# Patient Record
Sex: Male | Born: 1950 | Race: Black or African American | Hispanic: No | Marital: Married | Smoking: Former smoker
Health system: Southern US, Community
[De-identification: ages and names within clinical notes are randomized; demographics above are authoritative.]

## PROBLEM LIST (undated history)

## (undated) DIAGNOSIS — M1990 Unspecified osteoarthritis, unspecified site: Secondary | ICD-10-CM

## (undated) DIAGNOSIS — D869 Sarcoidosis, unspecified: Secondary | ICD-10-CM

## (undated) DIAGNOSIS — E119 Type 2 diabetes mellitus without complications: Secondary | ICD-10-CM

## (undated) DIAGNOSIS — I10 Essential (primary) hypertension: Secondary | ICD-10-CM

## (undated) HISTORY — PX: SHOULDER SURGERY: SHX246

## (undated) HISTORY — PX: ROTATOR CUFF REPAIR: SHX139

---

## 1999-10-15 ENCOUNTER — Encounter: Admission: RE | Admit: 1999-10-15 | Discharge: 1999-10-15 | Payer: Self-pay | Admitting: Cardiology

## 1999-10-15 ENCOUNTER — Encounter: Payer: Self-pay | Admitting: Cardiology

## 2000-09-05 ENCOUNTER — Encounter: Payer: Self-pay | Admitting: Cardiology

## 2000-09-05 ENCOUNTER — Encounter: Admission: RE | Admit: 2000-09-05 | Discharge: 2000-09-05 | Payer: Self-pay | Admitting: Cardiology

## 2000-11-26 ENCOUNTER — Emergency Department (HOSPITAL_COMMUNITY): Admission: EM | Admit: 2000-11-26 | Discharge: 2000-11-26 | Payer: Self-pay | Admitting: Emergency Medicine

## 2000-11-27 ENCOUNTER — Encounter: Payer: Self-pay | Admitting: Emergency Medicine

## 2001-08-03 ENCOUNTER — Encounter: Payer: Self-pay | Admitting: Emergency Medicine

## 2001-08-03 ENCOUNTER — Emergency Department (HOSPITAL_COMMUNITY): Admission: EM | Admit: 2001-08-03 | Discharge: 2001-08-04 | Payer: Self-pay | Admitting: Emergency Medicine

## 2001-09-08 ENCOUNTER — Encounter: Payer: Self-pay | Admitting: Cardiology

## 2001-09-08 ENCOUNTER — Encounter: Admission: RE | Admit: 2001-09-08 | Discharge: 2001-09-08 | Payer: Self-pay | Admitting: Cardiology

## 2002-09-06 ENCOUNTER — Encounter: Admission: RE | Admit: 2002-09-06 | Discharge: 2002-09-06 | Payer: Self-pay | Admitting: Cardiology

## 2002-09-06 ENCOUNTER — Encounter: Payer: Self-pay | Admitting: Cardiology

## 2003-04-30 ENCOUNTER — Emergency Department (HOSPITAL_COMMUNITY): Admission: EM | Admit: 2003-04-30 | Discharge: 2003-04-30 | Payer: Self-pay | Admitting: Emergency Medicine

## 2006-04-08 ENCOUNTER — Encounter: Admission: RE | Admit: 2006-04-08 | Discharge: 2006-04-08 | Payer: Self-pay | Admitting: Cardiology

## 2007-08-23 ENCOUNTER — Emergency Department (HOSPITAL_COMMUNITY): Admission: EM | Admit: 2007-08-23 | Discharge: 2007-08-23 | Payer: Self-pay | Admitting: Emergency Medicine

## 2010-02-04 ENCOUNTER — Emergency Department (HOSPITAL_COMMUNITY): Admission: EM | Admit: 2010-02-04 | Discharge: 2010-02-04 | Payer: Self-pay | Admitting: Emergency Medicine

## 2010-02-04 ENCOUNTER — Emergency Department (HOSPITAL_COMMUNITY): Admission: EM | Admit: 2010-02-04 | Discharge: 2010-02-04 | Payer: Self-pay | Admitting: Family Medicine

## 2010-02-05 ENCOUNTER — Ambulatory Visit (HOSPITAL_COMMUNITY): Admission: RE | Admit: 2010-02-05 | Discharge: 2010-02-05 | Payer: Self-pay | Admitting: Emergency Medicine

## 2010-12-27 ENCOUNTER — Encounter: Payer: Self-pay | Admitting: Cardiology

## 2011-02-26 LAB — POCT URINALYSIS DIP (DEVICE)
Bilirubin Urine: NEGATIVE
Hgb urine dipstick: NEGATIVE
Ketones, ur: 40 mg/dL — AB
Specific Gravity, Urine: 1.015 (ref 1.005–1.030)
pH: 8.5 — ABNORMAL HIGH (ref 5.0–8.0)

## 2011-04-23 NOTE — Consult Note (Signed)
[place]  Patient:    Jeffrey Hayes, Jeffrey Hayes                        MRN: [id number] Proc. Date: 11/26/00 Adm. Date:  [id number] Attending:  Lorre Nick                          Consultation Report  REQUESTING PHYSICIAN:  Dr. 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  He presents today with a chief complaint of pain and deformity to his right hand.  He is an otherwise healthy 60 year old male.  ALLERGIES:  No known drug allergies.  CURRENT MEDICATIONS:  None.  PAST MEDICAL HISTORY:  No past medical or surgical history of note.  SOCIAL HISTORY:  Alcohol use and occasional smoking.  PHYSICAL EXAMINATION:  GENERAL:  Well-developed, well-nourished male, pleasant, alert, and oriented x 3.  EXTREMITIES:  Examination of his hand on the right:  He has an obvious open injury to the ring finger with a fractured nail plate and a nail bed injury. He also has a laceration to the long finger.  He has 2+ radial pulse and brisk capillary refill.  Skin is otherwise intact.  There are no other signs of erythema, drainage, or signs of infection.  Movement of the tips of the fingers is painful.  X-RAY DATA:  X-rays show a distal tuft fracture of the ring finger; no fracture of the long finger.  IMPRESSION:  A 60 year old male with an open fracture of distal phalanx of dominant right ring finger and soft tissue injury to the long finger.  Under sterile conditions, we anesthetized the ring finger and then prepped and draped in the usual sterile fashion.  We carefully dissected free the nail plate from underlying nail bed.  There was a complete laceration of the nail bed at the intersection between the germinal cell matrices.  This was repaired with 6-0 undyed Vicryl x 4  sutures.  This was followed by replacement of the nail plate back over the repaired nail bed after debridement of the open fracture.  The fracture was debrided of clot and nonviable material, including some bits of metallic fragments.  The wounds were then dressed with Xeroform, 4 x 4s, and a Coban compressive wrap.  The long finger was dressed with Xeroform, 4 x 4s, and a compressive wrap as well.  The patient was discharged from the emergency department with Keflex 500 mg one p.o. q.i.d. for a week for antibiotic prophylaxis as well as Vicodin, #20, one to two every three to four hours as needed for pain.  He is to follow up in my office this week on Thursday.  He was instructed to follow up with me sooner or call for any questions regarding increasing pain; signs of infection, including streaking up the arm, fever, chills, etc. DD:  11/26/00 TD:  11/27/00 Job: [id number]

## 2012-05-23 ENCOUNTER — Encounter (HOSPITAL_COMMUNITY): Payer: Self-pay | Admitting: *Deleted

## 2012-05-23 ENCOUNTER — Emergency Department (HOSPITAL_COMMUNITY): Payer: No Typology Code available for payment source

## 2012-05-23 ENCOUNTER — Emergency Department (HOSPITAL_COMMUNITY)
Admission: EM | Admit: 2012-05-23 | Discharge: 2012-05-24 | Disposition: A | Discharge status: Home / Self Care | Payer: No Typology Code available for payment source | Attending: Emergency Medicine | Admitting: Emergency Medicine

## 2012-05-23 DIAGNOSIS — S0990XA Unspecified injury of head, initial encounter: Secondary | ICD-10-CM | POA: Insufficient documentation

## 2012-05-23 DIAGNOSIS — R51 Headache: Secondary | ICD-10-CM | POA: Insufficient documentation

## 2012-05-23 DIAGNOSIS — S161XXA Strain of muscle, fascia and tendon at neck level, initial encounter: Secondary | ICD-10-CM

## 2012-05-23 DIAGNOSIS — S335XXA Sprain of ligaments of lumbar spine, initial encounter: Secondary | ICD-10-CM | POA: Insufficient documentation

## 2012-05-23 DIAGNOSIS — S39012A Strain of muscle, fascia and tendon of lower back, initial encounter: Secondary | ICD-10-CM

## 2012-05-23 DIAGNOSIS — S139XXA Sprain of joints and ligaments of unspecified parts of neck, initial encounter: Secondary | ICD-10-CM | POA: Insufficient documentation

## 2012-05-23 DIAGNOSIS — M542 Cervicalgia: Secondary | ICD-10-CM | POA: Insufficient documentation

## 2012-05-23 DIAGNOSIS — I10 Essential (primary) hypertension: Secondary | ICD-10-CM | POA: Insufficient documentation

## 2012-05-23 HISTORY — DX: Essential (primary) hypertension: I10

## 2012-05-23 MED ORDER — HYDROCODONE-ACETAMINOPHEN 5-325 MG PO TABS
1.0000 | ORAL_TABLET | Freq: Once | ORAL | Status: AC
  Administered 2012-05-24: 1 via ORAL
  Filled 2012-05-23: qty 1

## 2012-05-23 NOTE — ED Notes (Signed)
Pt in radiology 
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
## 2012-05-24 MED ORDER — HYDROCODONE-ACETAMINOPHEN 5-325 MG PO TABS: 1.0000 | ORAL_TABLET | Freq: Once | ORAL | Status: AC

## 2012-05-24 MED ORDER — CYCLOBENZAPRINE HCL 10 MG PO TABS: 10.0000 mg | ORAL_TABLET | Freq: Two times a day (BID) | ORAL | Status: AC | PRN
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
## 2012-05-24 NOTE — Discharge Instructions (Signed)
Cervical Sprain A cervical sprain is an injury in the neck in which the ligaments are stretched or torn. The ligaments are the tissues that hold the bones of the neck (vertebrae) in place.Cervical sprains can range from very mild to very severe. Most cervical sprains get better in 1 to 3 weeks, but it depends on the cause and extent of the injury. Severe cervical sprains can cause the neck vertebrae to be unstable. This can lead to damage of the spinal cord and can result in serious nervous system problems. Your caregiver will determine whether your cervical sprain is mild or severe. CAUSES  Severe cervical sprains may be caused by:  Contact sport injuries (football, rugby, wrestling, hockey, auto racing, gymnastics, diving, martial arts, boxing).   Motor vehicle collisions.   Whiplash injuries. This means the neck is forcefully whipped backward and forward.   Falls.  Mild cervical sprains may be caused by:   Awkward positions, such as cradling a telephone between your ear and shoulder.   Sitting in a chair that does not offer proper support.   Working at a poorly designed computer station.   Activities that require looking up or down for long periods of time.  SYMPTOMS   Pain, soreness, stiffness, or a burning sensation in the front, back, or sides of the neck. This discomfort may develop immediately after injury or it may develop slowly and not begin for 24 hours or more after an injury.   Pain or tenderness directly in the middle of the back of the neck.   Shoulder or upper back pain.   Limited ability to move the neck.   Headache.   Dizziness.   Weakness, numbness, or tingling in the hands or arms.   Muscle spasms.   Difficulty swallowing or chewing.   Tenderness and swelling of the neck.  DIAGNOSIS  Most of the time, your caregiver can diagnose this problem by taking your history and doing a physical exam. Your caregiver will ask about any known problems, such as  arthritis in the neck or a previous neck injury. X-rays may be taken to find out if there are any other problems, such as problems with the bones of the neck. However, an X-ray often does not reveal the full extent of a cervical sprain. Other tests such as a computed tomography (CT) scan or magnetic resonance imaging (MRI) may be needed. TREATMENT  Treatment depends on the severity of the cervical sprain. Mild sprains can be treated with rest, keeping the neck in place (immobilization), and pain medicines. Severe cervical sprains need immediate immobilization and an appointment with an orthopedist or neurosurgeon. Several treatment options are available to help with pain, muscle spasms, and other symptoms. Your caregiver may prescribe:  Medicines, such as pain relievers, numbing medicines, or muscle relaxants.   Physical therapy. This can include stretching exercises, strengthening exercises, and posture training. Exercises and improved posture can help stabilize the neck, strengthen muscles, and help stop symptoms from returning.   A neck collar to be worn for short periods of time. Often, these collars are worn for comfort. However, certain collars may be worn to protect the neck and prevent further worsening of a serious cervical sprain.  HOME CARE INSTRUCTIONS   Put ice on the injured area.   Put ice in a plastic bag.   Place a towel between your skin and the bag.   Leave the ice on for 15 to 20 minutes, 3 to 4 times a day.     to 20 minutes, 3 to 4 times a day.   Only take over-the-counter or prescription medicines for pain, discomfort, or fever as directed by your caregiver.   Keep all follow-up appointments as directed by your caregiver.   Keep all physical therapy appointments as directed by your caregiver.   If a neck collar is prescribed, wear it as directed by your caregiver.   Do not drive while wearing a neck collar.   Make any needed adjustments to your work station to promote good posture.   Avoid positions and activities that make your  symptoms worse.   Warm up and stretch before being active to help prevent problems.  SEEK MEDICAL CARE IF:    Your pain is not controlled with medicine.   You are unable to decrease your pain medicine over time as planned.   Your activity level is not improving as expected.  SEEK IMMEDIATE MEDICAL CARE IF:    You develop any bleeding, stomach upset, or signs of an allergic reaction to your medicine.   Your symptoms get worse.   You develop new, unexplained symptoms.   You have numbness, tingling, weakness, or paralysis in any part of your body.  MAKE SURE YOU:    Understand these instructions.   Will watch your condition.   Will get help right away if you are not doing well or get worse.  Document Released: 09/19/2007 Document Revised: 11/11/2011 Document Reviewed: 08/25/2011  [employer] Patient Information 2012 [employer]  Head Injury, Adult  You have had a head injury that does not appear serious at this time. A concussion is a state of changed mental ability, usually from a blow to the head. You should take clear liquids for the rest of the day and then resume your regular diet. You should not take sedatives or alcoholic beverages for as long as directed by your caregiver after discharge. After injuries such as yours, most problems occur within the first 24 hours.  SYMPTOMS  These minor symptoms may be experienced after discharge:   Memory difficulties.   Dizziness.   Headaches.   Double vision.   Hearing difficulties.   Depression.   Tiredness.   Weakness.   Difficulty with concentration.  If you experience any of these problems, you should not be alarmed. A concussion requires a few days for recovery. Many patients with head injuries frequently experience such symptoms. Usually, these problems disappear without medical care. If symptoms last for more than one day, notify your caregiver. See your caregiver sooner if symptoms are becoming worse rather than better.  HOME CARE INSTRUCTIONS     During the next 24 hours you must stay with someone who can watch you for the warning signs listed below.  Although it is unlikely that serious side effects will occur, you should be aware of signs and symptoms which may necessitate your return to this location. Side effects may occur up to 7 - 10 days following the injury. It is important for you to carefully monitor your condition and contact your caregiver or seek immediate medical attention if there is a change in your condition.  SEEK IMMEDIATE MEDICAL CARE IF:    There is confusion or drowsiness.   You can not awaken the injured person.   There is nausea (feeling sick to your stomach) or continued, forceful vomiting.   You notice dizziness or unsteadiness which is getting worse, or inability to walk.   You have convulsions or unconsciousness.   You experience severe, persistent   directed.   You can not use arms or legs normally.   There is clear or bloody discharge from the nose or ears.  MAKE SURE YOU:   Understand these instructions.   Will watch your condition.   Will get help right away if you are not doing well or get worse.  Document Released: 11/22/2005 Document Revised: 11/11/2011 Document Reviewed: 10/10/2009 [facility] Patient Information 2012 [place], [place].Lumbosacral Strain Lumbosacral strain is one of the most common causes of back pain. There are many causes of back pain. Most are not serious conditions. CAUSES  Your backbone (spinal column) is made up of 24 main vertebral bodies, the sacrum, and the coccyx. These are held together by muscles and tough, fibrous tissue (ligaments). Nerve roots pass through the openings between the vertebrae. A sudden move or injury to the back may cause injury to,  or pressure on, these nerves. This may result in localized back pain or pain movement (radiation) into the buttocks, down the leg, and into the foot. Sharp, shooting pain from the buttock down the back of the leg (sciatica) is frequently associated with a ruptured (herniated) disk. Pain may be caused by muscle spasm alone. Your caregiver can often find the cause of your pain by the details of your symptoms and an exam. In some cases, you may need tests (such as X-rays). Your caregiver will work with you to decide if any tests are needed based on your specific exam. HOME CARE INSTRUCTIONS   Avoid an underactive lifestyle. Active exercise, as directed by your caregiver, is your greatest weapon against back pain.   Avoid hard physical activities (tennis, racquetball, waterskiing) if you are not in proper physical condition for it. This may aggravate or create problems.   If you have a back problem, avoid sports requiring sudden body movements. Swimming and walking are generally safer activities.   Maintain good posture.   Avoid becoming overweight (obese).   Use bed rest for only the most extreme, sudden (acute) episode. Your caregiver will help you determine how much bed rest is necessary.   For acute conditions, you may put ice on the injured area.   Put ice in a plastic bag.   Place a towel between your skin and the bag.   Leave the ice on for 15 to 20 minutes at a time, every 2 hours, or as needed.   After you are improved and more active, it may help to apply heat for 30 minutes before activities.  See your caregiver if you are having pain that lasts longer than expected. Your caregiver can advise appropriate exercises or therapy if needed. With conditioning, most back problems can be avoided. SEEK IMMEDIATE MEDICAL CARE IF:   You have numbness, tingling, weakness, or problems with the use of your arms or legs.   You experience severe back pain not relieved with medicines.   There  is a change in bowel or bladder control.   You have increasing pain in any area of the body, including your belly (abdomen).   You notice shortness of breath, dizziness, or feel faint.   You feel sick to your stomach (nauseous), are throwing up (vomiting), or become sweaty.   You notice discoloration of your toes or legs, or your feet get very cold.   Your back pain is getting worse.   You have a fever.  MAKE SURE YOU:   Understand these instructions.   Will watch your condition.   Will get help right away if  you are not doing well or get worse.  Document Released: 09/01/2005 Document Revised: 11/11/2011 Document Reviewed: 02/21/2009 [facility] Patient Information 2012 [place], [place].

## 2012-05-26 NOTE — ED Provider Notes (Signed)
Medical screening examination/treatment/procedure(s) were performed by non-physician practitioner and as supervising physician I was immediately available for consultation/collaboration.   Sherene Plancarte, MD 05/26/12 1611 

## 2013-03-07 ENCOUNTER — Encounter (HOSPITAL_BASED_OUTPATIENT_CLINIC_OR_DEPARTMENT_OTHER)
Admission: RE | Disposition: A | Discharge status: Home / Self Care | Payer: Self-pay | Source: Ambulatory Visit | Attending: Ophthalmology

## 2013-03-07 ENCOUNTER — Encounter (HOSPITAL_BASED_OUTPATIENT_CLINIC_OR_DEPARTMENT_OTHER): Admission: RE | Payer: Self-pay | Source: Ambulatory Visit

## 2013-03-07 ENCOUNTER — Other Ambulatory Visit: Payer: Self-pay | Admitting: Ophthalmology

## 2013-03-07 ENCOUNTER — Ambulatory Visit (HOSPITAL_BASED_OUTPATIENT_CLINIC_OR_DEPARTMENT_OTHER)
Admission: RE | Admit: 2013-03-07 | Discharge: 2013-03-07 | Disposition: A | Discharge status: Home / Self Care | Payer: Medicaid Other | Source: Ambulatory Visit | Attending: Ophthalmology | Admitting: Ophthalmology

## 2013-03-07 ENCOUNTER — Encounter: Payer: Self-pay | Admitting: Ophthalmology

## 2013-03-07 DIAGNOSIS — F17200 Nicotine dependence, unspecified, uncomplicated: Secondary | ICD-10-CM | POA: Insufficient documentation

## 2013-03-07 DIAGNOSIS — H00019 Hordeolum externum unspecified eye, unspecified eyelid: Secondary | ICD-10-CM | POA: Insufficient documentation

## 2013-03-07 DIAGNOSIS — H02829 Cysts of unspecified eye, unspecified eyelid: Secondary | ICD-10-CM | POA: Insufficient documentation

## 2013-03-07 DIAGNOSIS — I10 Essential (primary) hypertension: Secondary | ICD-10-CM | POA: Insufficient documentation

## 2013-03-07 HISTORY — PX: CHALAZION EXCISION: SHX213

## 2013-03-07 SURGERY — EXCISION, CHALAZION
Anesthesia: LOCAL | Laterality: Right

## 2013-03-07 SURGERY — MINOR EXCISION OF CHALAZION
Anesthesia: LOCAL | Site: Eye | Laterality: Left | Wound class: Dirty or Infected

## 2013-03-07 MED ORDER — LIDOCAINE HCL 1 % IJ SOLN
INTRAMUSCULAR | Status: DC | PRN
  Administered 2013-03-07: 6 mL

## 2013-03-07 MED ORDER — BACITRACIN-POLYMYXIN B 500-10000 UNIT/GM OP OINT
TOPICAL_OINTMENT | OPHTHALMIC | Status: DC | PRN
  Administered 2013-03-07: 1 via OPHTHALMIC

## 2013-03-07 MED ORDER — BALANCED SALT IO SOLN
INTRAOCULAR | Status: DC | PRN
  Administered 2013-03-07: 15 mL via INTRAOCULAR

## 2013-03-07 SURGICAL SUPPLY — 19 items
APPLICATOR COTTON TIP 6IN STRL (MISCELLANEOUS) ×3 IMPLANT
BLADE SURG 11 STRL SS (BLADE) ×2 IMPLANT
CAUTERY EYE LOW TEMP 1300F FIN (OPHTHALMIC RELATED) IMPLANT
CLOTH BEACON ORANGE TIMEOUT ST (SAFETY) ×2 IMPLANT
GAUZE SPONGE 4X4 12PLY STRL LF (GAUZE/BANDAGES/DRESSINGS) ×2 IMPLANT
GLOVE BIO SURGEON STRL SZ 6.5 (GLOVE) ×1 IMPLANT
GLOVE ECLIPSE 7.0 STRL STRAW (GLOVE) ×2 IMPLANT
MARKER SKIN DUAL TIP RULER LAB (MISCELLANEOUS) ×2 IMPLANT
NDL HYPO 25X5/8 SAFETYGLIDE (NEEDLE) ×1 IMPLANT
NDL SAFETY ECLIPSE 18X1.5 (NEEDLE) ×1 IMPLANT
NEEDLE HYPO 18GX1.5 SHARP (NEEDLE) ×2
NEEDLE HYPO 25X5/8 SAFETYGLIDE (NEEDLE) ×2 IMPLANT
PAD ALCOHOL SWAB (MISCELLANEOUS) ×2 IMPLANT
PAD EYE OVAL STERILE LF (GAUZE/BANDAGES/DRESSINGS) ×4 IMPLANT
SPONGE GAUZE 4X4 12PLY (GAUZE/BANDAGES/DRESSINGS) ×1 IMPLANT
SUT SILK 6 0 P 1 (SUTURE) ×1 IMPLANT
SWABSTICK POVIDONE IODINE SNGL (MISCELLANEOUS) ×4 IMPLANT
SYR CONTROL 10ML LL (SYRINGE) ×2 IMPLANT
TOWEL OR 17X24 6PK STRL BLUE (TOWEL DISPOSABLE) ×2 IMPLANT

## 2013-03-07 NOTE — OR Nursing (Signed)
Dr Mitzi Davenport faxed H&P to Medical Records this AM.  H&P with chart.

## 2013-03-07 NOTE — Brief Op Note (Signed)
03/07/2013  8:00 AM  PATIENT:  Delana Meyer  62 y.o. male  PRE-OPERATIVE DIAGNOSIS:  cyst  POST-OPERATIVE DIAGNOSIS:  * No post-op diagnosis entered *  PROCEDURE:  Procedure(s): EXCISION OF CYST LOWER LEFT LID (Left)  SURGEON:  Surgeon(s) and Role:    Vita Erm., MD - Primary  PHYSICIAN ASSISTANT:   ASSISTANTS: none   ANESTHESIA:   local  EBL:     BLOOD ADMINISTERED:none  DRAINS: none   LOCAL MEDICATIONS USED:  LIDOCAINE   SPECIMEN:  Excision  DISPOSITION OF SPECIMEN:  PATHOLOGY  COUNTS:  YES  TOURNIQUET:  * No tourniquets in log *  DICTATION: .Other Dictation: Dictation Number [id number]  PLAN OF CARE: Discharge to home after PACU  PATIENT DISPOSITION:  Short Stay   Delay start of Pharmacological VTE agent (>24hrs) due to surgical blood loss or risk of bleeding: not applicable

## 2013-03-07 NOTE — H&P (Signed)
  62 yo male has large cyst lower lid right eye, medial aspect.  Has had pus expressible from lesion.  Patient admitted for cyst excision lower lid right eye.

## 2013-03-08 ENCOUNTER — Encounter (HOSPITAL_BASED_OUTPATIENT_CLINIC_OR_DEPARTMENT_OTHER): Payer: Self-pay | Admitting: Ophthalmology

## 2013-03-09 NOTE — H&P (Signed)
NAME:  Jeffrey Hayes, Jeffrey Hayes NO.:  [id number]  MEDICAL RECORD NO.:  [id number]  LOCATION:                               FACILITY:  [facility]  PHYSICIAN:  Salley Scarlet., M.D.DATE OF BIRTH:  01/25/1951  DATE OF ADMISSION:  03/07/2013 DATE OF DISCHARGE:  03/07/2013                             Operative Note   PREOPERATIVE DIAGNOSIS:  Cyst, lower lid, left eye.  POSTOPERATIVE DIAGNOSIS:  Cyst, lower lid, left eye.  OPERATION:  Excision of cyst, lower lid, left eye.  ANESTHESIA:  Local using Xylocaine 1%  JUSTIFICATION FOR PROCEDURE:  This is a 62 year old gentleman who has had a cystic mass located along the medial aspect of the lower lid of the right eye for several months.  The lesion at time becomes tender, and it varies in size from time to time.  When seen initially, pus was expressible from the lesion.  After conservative treatment with antibiotics, there was no change in the lesion size.  Therefore, excision of the cyst was recommended.  He is admitted at this time for that purpose.  DESCRIPTION OF PROCEDURE:  The patient was brought to the operating room, where under the influence of local anesthesia, the patient was prepped and draped in the usual manner.  The lower lid of the left eye was infiltrated with several mL of Xylocaine.  The cyst was outlined with a curvilinear excision using sharp and blunt dissection.  Then, the lesion was excised, also using sharp and blunt dissection.  The cyst was punctured, and a copious amount of caseous or mucopurulent material was expressed into the wound.  The lesion was excised and submitted to pathology.  The wound was then irrigated thoroughly with saline so as to remove the material from the wound.  The skin was then undermined with sharp and blunt dissection.  The skin margins were then reapproximated by using interrupted sutures of 6-0 silk.  The patient tolerated procedure well and was discharged in satisfactory  condition with instructions to doxycycline 100 mg twice a day for the next 4 days.  The patient is to see me in office in 1 week.  DISCHARGE DIAGNOSIS:  Cyst, lower lid, left eye.     Salley Scarlet., M.D.     TB/MEDQ  D:  03/08/2013  T:  03/09/2013  Job:  [id number]

## 2013-04-09 ENCOUNTER — Ambulatory Visit (HOSPITAL_BASED_OUTPATIENT_CLINIC_OR_DEPARTMENT_OTHER): Admission: RE | Admit: 2013-04-09 | Payer: Medicaid Other | Source: Ambulatory Visit | Admitting: Ophthalmology

## 2016-06-25 ENCOUNTER — Other Ambulatory Visit: Payer: Self-pay | Admitting: Cardiology

## 2016-06-25 ENCOUNTER — Ambulatory Visit
Admission: RE | Admit: 2016-06-25 | Discharge: 2016-06-25 | Disposition: A | Discharge status: Home / Self Care | Payer: Medicaid Other | Source: Ambulatory Visit | Attending: Cardiology | Admitting: Cardiology

## 2016-06-25 DIAGNOSIS — R059 Cough, unspecified: Secondary | ICD-10-CM

## 2016-06-25 DIAGNOSIS — F17200 Nicotine dependence, unspecified, uncomplicated: Secondary | ICD-10-CM

## 2016-06-25 DIAGNOSIS — R05 Cough: Secondary | ICD-10-CM

## 2016-09-17 ENCOUNTER — Encounter (HOSPITAL_COMMUNITY): Payer: Self-pay

## 2016-09-17 ENCOUNTER — Emergency Department (HOSPITAL_COMMUNITY)
Admission: EM | Admit: 2016-09-17 | Discharge: 2016-09-17 | Disposition: A | Discharge status: Home / Self Care | Payer: Medicaid Other | Attending: Emergency Medicine | Admitting: Emergency Medicine

## 2016-09-17 DIAGNOSIS — Z791 Long term (current) use of non-steroidal anti-inflammatories (NSAID): Secondary | ICD-10-CM | POA: Diagnosis not present

## 2016-09-17 DIAGNOSIS — T783XXA Angioneurotic edema, initial encounter: Secondary | ICD-10-CM | POA: Diagnosis not present

## 2016-09-17 DIAGNOSIS — Z79899 Other long term (current) drug therapy: Secondary | ICD-10-CM | POA: Insufficient documentation

## 2016-09-17 DIAGNOSIS — I10 Essential (primary) hypertension: Secondary | ICD-10-CM | POA: Diagnosis not present

## 2016-09-17 DIAGNOSIS — F17200 Nicotine dependence, unspecified, uncomplicated: Secondary | ICD-10-CM | POA: Diagnosis not present

## 2016-09-17 DIAGNOSIS — R220 Localized swelling, mass and lump, head: Secondary | ICD-10-CM | POA: Diagnosis present

## 2016-09-17 MED ORDER — PREDNISONE 20 MG PO TABS: 60.0000 mg | ORAL_TABLET | Freq: Every day | ORAL | Status: DC

## 2016-09-17 MED ORDER — ONDANSETRON 4 MG PO TBDP
4.0000 mg | ORAL_TABLET | Freq: Once | ORAL | Status: AC
  Administered 2016-09-17: 4 mg via ORAL
  Filled 2016-09-17: qty 1

## 2016-09-17 MED ORDER — ONDANSETRON HCL 4 MG/2ML IJ SOLN
4.0000 mg | Freq: Once | INTRAMUSCULAR | Status: DC
  Filled 2016-09-17: qty 2

## 2016-09-17 MED ORDER — FAMOTIDINE 20 MG PO TABS
20.0000 mg | ORAL_TABLET | Freq: Once | ORAL | Status: AC
  Administered 2016-09-17: 20 mg via ORAL
  Filled 2016-09-17: qty 1

## 2016-09-17 NOTE — ED Triage Notes (Signed)
Pt feeling much better, states he has a little throat discomfort, denies swelling and has no difficulty swallowing pills. Pt A&O x 3.

## 2016-09-17 NOTE — Discharge Instructions (Signed)
Please stop taking your Losartan-HCTZ pill immediately. You may continue to take your amlodipine. Return to the emergency department immediately if he develop any worsening of the swelling and your mouth. Please call your primary care physician to have your blood pressure medications managed as we are discontinuing this medication. When the information below about your diagnosis and reasons to seek immediate medical attention.

## 2016-09-17 NOTE — ED Provider Notes (Signed)
[facility] DEPT Provider Note   CSN: [id number] Arrival date & time: 09/17/16  0410     History   Chief Complaint Chief Complaint  Patient presents with  . Oral Swelling    HPI Jeffrey Hayes is a 65 y.o. male who presents emergency Department with chief complaint of tongue swelling. Patient states that he awoke in the middle of the night with swelling to the tongue. He states that it became progressively worse overnight to the point where he felt like he could not swallow and came to the emergency department. He does take losartan, but denies any ACE inhibitor. He is noted. Did not take it yesterday. The patient denies any previous history of angioedema or anaphylaxis. He has no history of allergic reaction to medications. The patient denies wheezing, shortness of breath, stridor, hives. He states that his tongue has improved significantly while waiting to be seen without any interventions. He still feels some swelling in the sub-lingual region but denies any difficulty swallowing at this time  HPI  Past Medical History:  Diagnosis Date  . Hypertension     There are no active problems to display for this patient.   Past Surgical History:  Procedure Laterality Date  . CHALAZION EXCISION Left 03/07/2013   Procedure: EXCISION OF CYST LOWER LEFT EYELID;  Surgeon: Myrtha Mantis., MD;  Location: [facility];  Service: Ophthalmology;  Laterality: Left;       Home Medications    Prior to Admission medications   Medication Sig Start Date End Date Taking? Authorizing Provider  amLODipine (NORVASC) 10 MG tablet Take 10 mg by mouth daily.   Yes Historical Provider, MD  atorvastatin (LIPITOR) 10 MG tablet Take 10 mg by mouth daily.   Yes Historical Provider, MD  ibuprofen (ADVIL,MOTRIN) 800 MG tablet Take 800 mg by mouth every 8 (eight) hours as needed for pain.   Yes Historical Provider, MD  Pseudoephedrine HCl (SUDAFED PO) Take 1 tablet by mouth daily.   Yes  Historical Provider, MD  valsartan-hydrochlorothiazide (DIOVAN-HCT) 320-12.5 MG per tablet Take 1 tablet by mouth daily.    Historical Provider, MD    Family History History reviewed. No pertinent family history.  Social History Social History  Substance Use Topics  . Smoking status: Current Every Day Smoker  . Smokeless tobacco: Never Used  . Alcohol use Yes     Allergies   Review of patient's allergies indicates no known allergies.   Review of Systems Review of Systems  Ten systems reviewed and are negative for acute change, except as noted in the HPI.   Physical Exam Updated Vital Signs BP 131/84   Pulse 78   Temp 98.1 F (36.7 C)   Resp 18   SpO2 98%   Physical Exam  Physical Exam  Nursing note and vitals reviewed. Constitutional: He appears well-developed and well-nourished. No distress.  HENT:  Head: Normocephalic and atraumatic.  Eyes: Conjunctivae normal are normal. No scleral icterus.  Mouth: sublingual swelling and fullness of the sublingual region. No swelling in the oropharynx. No stridor Neck: Normal range of motion. Neck supple.  Cardiovascular: Normal rate, regular rhythm and normal heart sounds.   Pulmonary/Chest: Effort normal and breath sounds normal. No respiratory distress. No wheezing. Abdominal: Soft. There is no tenderness.  Musculoskeletal: He exhibits no edema.  Neurological: He is alert.  Skin: Skin is warm and dry. He is not diaphoretic.  Psychiatric: His behavior is normal.    ED Treatments / Results  Labs (all labs ordered are listed, but only abnormal results are displayed) Labs Reviewed - No data to display  EKG  EKG Interpretation None       Radiology No results found.  Procedures Procedures (including critical care time)  Medications Ordered in ED Medications  famotidine (PEPCID) tablet 20 mg (not administered)  predniSONE (DELTASONE) tablet 60 mg (not administered)  ondansetron (ZOFRAN) injection 4 mg (not  administered)     Initial Impression / Assessment and Plan / ED Course  I have reviewed the triage vital signs and the nursing notes.  Pertinent labs & imaging results that were available during my care of the patient were reviewed by me and considered in my medical decision making (see chart for details).  Clinical Course    Patient observed in the ER for several hours with improvement in his symptoms. He denies any shortness of breath or difficulty swallowing. Patient appears safe for discharge at this time. He is to discontinue his losartan and contact his pcp for med management. I have discussed return precautions with the patient who expresses his understanding and agrees with POC.   Final Clinical Impressions(s) / ED Diagnoses   Final diagnoses:  None    New Prescriptions New Prescriptions   No medications on file     Margarita Mail, PA-C 09/17/16 [id number]    April Palumbo, MD 09/17/16 2344

## 2016-09-17 NOTE — ED Triage Notes (Signed)
Pt woke up about 1am stating that his tongue was swelling, no new medication or foods

## 2016-12-01 DIAGNOSIS — H40013 Open angle with borderline findings, low risk, bilateral: Secondary | ICD-10-CM | POA: Diagnosis not present

## 2016-12-01 DIAGNOSIS — H40051 Ocular hypertension, right eye: Secondary | ICD-10-CM | POA: Diagnosis not present

## 2016-12-16 DIAGNOSIS — H40011 Open angle with borderline findings, low risk, right eye: Secondary | ICD-10-CM | POA: Diagnosis not present

## 2016-12-16 DIAGNOSIS — H40051 Ocular hypertension, right eye: Secondary | ICD-10-CM | POA: Diagnosis not present

## 2016-12-24 ENCOUNTER — Ambulatory Visit (HOSPITAL_COMMUNITY)
Admission: EM | Admit: 2016-12-24 | Discharge: 2016-12-24 | Disposition: A | Discharge status: Home / Self Care | Payer: Medicaid Other | Attending: Family Medicine | Admitting: Family Medicine

## 2016-12-24 ENCOUNTER — Encounter (HOSPITAL_COMMUNITY): Payer: Self-pay | Admitting: Emergency Medicine

## 2016-12-24 DIAGNOSIS — R059 Cough, unspecified: Secondary | ICD-10-CM

## 2016-12-24 DIAGNOSIS — J40 Bronchitis, not specified as acute or chronic: Secondary | ICD-10-CM

## 2016-12-24 DIAGNOSIS — R05 Cough: Secondary | ICD-10-CM

## 2016-12-24 MED ORDER — BENZONATATE 100 MG PO CAPS: 200.0000 mg | ORAL_CAPSULE | Freq: Three times a day (TID) | ORAL | 0 refills | Status: DC | PRN

## 2016-12-24 MED ORDER — AZITHROMYCIN 250 MG PO TABS: 250.0000 mg | ORAL_TABLET | Freq: Every day | ORAL | 0 refills | Status: DC

## 2016-12-24 MED ORDER — METHYLPREDNISOLONE 4 MG PO TBPK: ORAL_TABLET | ORAL | 0 refills | Status: DC

## 2016-12-24 NOTE — ED Provider Notes (Signed)
CSN: [id number]     Arrival date & time 12/24/16  1008 History   First MD Initiated Contact with Patient 12/24/16 1122     Chief Complaint  Patient presents with  . URI   (Consider location/radiation/quality/duration/timing/severity/associated sxs/prior Treatment) Patient c/o cough and uri sx's.  Patient is a smoker.     The history is provided by the patient.  URI  Presenting symptoms: congestion and cough   Severity:  Moderate Onset quality:  Sudden Duration:  7 days Timing:  Constant Progression:  Worsening Chronicity:  New Relieved by:  Nothing Worsened by:  Nothing   Past Medical History:  Diagnosis Date  . Hypertension    Past Surgical History:  Procedure Laterality Date  . CHALAZION EXCISION Left 03/07/2013   Procedure: EXCISION OF CYST LOWER LEFT EYELID;  Surgeon: Myrtha Mantis., MD;  Location: [facility];  Service: Ophthalmology;  Laterality: Left;   History reviewed. No pertinent family history. Social History  Substance Use Topics  . Smoking status: Current Every Day Smoker  . Smokeless tobacco: Never Used  . Alcohol use Yes    Review of Systems  Constitutional: Negative.   HENT: Positive for congestion.   Eyes: Negative.   Respiratory: Positive for cough.   Cardiovascular: Negative.   Gastrointestinal: Negative.   Endocrine: Negative.   Genitourinary: Negative.   Musculoskeletal: Negative.   Allergic/Immunologic: Negative.   Neurological: Negative.   Psychiatric/Behavioral: Negative.     Allergies  Patient has no known allergies.  Home Medications   Prior to Admission medications   Medication Sig Start Date End Date Taking? Authorizing Provider  amLODipine (NORVASC) 10 MG tablet Take 10 mg by mouth daily.   Yes Historical Provider, MD  atorvastatin (LIPITOR) 10 MG tablet Take 10 mg by mouth daily.   Yes Historical Provider, MD  azithromycin (ZITHROMAX) 250 MG tablet Take 1 tablet (250 mg total) by mouth daily. Take  first 2 tablets together, then 1 every day until finished. 12/24/16   Lysbeth Penner, FNP  benzonatate (TESSALON) 100 MG capsule Take 2 capsules (200 mg total) by mouth 3 (three) times daily as needed for cough. 12/24/16   Lysbeth Penner, FNP  ibuprofen (ADVIL,MOTRIN) 800 MG tablet Take 800 mg by mouth every 8 (eight) hours as needed for pain.    Historical Provider, MD  methylPREDNISolone (MEDROL DOSEPAK) 4 MG TBPK tablet Take 6-5-4-3-2-1 po qd 12/24/16   Lysbeth Penner, FNP  Pseudoephedrine HCl (SUDAFED PO) Take 1 tablet by mouth daily.    Historical Provider, MD   Meds Ordered and Administered this Visit  Medications - No data to display  BP 137/79 (BP Location: Left Arm)   Pulse 100   Temp 99.6 F (37.6 C) (Oral)   Resp 20   SpO2 97%  No data found.   Physical Exam  Constitutional: He appears well-developed and well-nourished.  HENT:  Head: Normocephalic and atraumatic.  Right Ear: External ear normal.  Left Ear: External ear normal.  Mouth/Throat: Oropharynx is clear and moist.  Eyes: Conjunctivae and EOM are normal. Pupils are equal, round, and reactive to light.  Neck: Normal range of motion. Neck supple.  Cardiovascular: Normal rate, regular rhythm and normal heart sounds.   Pulmonary/Chest: Effort normal. He has wheezes.  Abdominal: Soft. Bowel sounds are normal.  Nursing note and vitals reviewed.   Urgent Care Course     Procedures (including critical care time)  Labs Review Labs Reviewed - No data to display  Imaging Review No results found.   Visual Acuity Review  Right Eye Distance:   Left Eye Distance:   Bilateral Distance:    Right Eye Near:   Left Eye Near:    Bilateral Near:         MDM   1. Bronchitis   2. Cough    Zpak Medrol dose pack as directed Tessalon  Push po fluids, rest, tylenol and motrin otc prn as directed for fever, arthralgias, and myalgias.  Follow up prn if sx's continue or persist.    Lysbeth Penner,  FNP 12/24/16 [place], [place] 12/24/16 1131

## 2016-12-24 NOTE — ED Triage Notes (Signed)
Pt c/o cold sx onset: 5 days  Sx include: chills, prod cough, nasal drainage/congestion, weakness, BA, diarrhea  Denies: fevers   Taking: OTC cold meds w/no relief.   A&O x4... NAD

## 2017-01-04 ENCOUNTER — Other Ambulatory Visit: Payer: Self-pay | Admitting: Cardiology

## 2017-01-04 DIAGNOSIS — E119 Type 2 diabetes mellitus without complications: Secondary | ICD-10-CM | POA: Diagnosis not present

## 2017-01-18 ENCOUNTER — Other Ambulatory Visit: Payer: Self-pay | Admitting: Cardiology

## 2017-01-18 DIAGNOSIS — F17211 Nicotine dependence, cigarettes, in remission: Secondary | ICD-10-CM

## 2017-01-25 ENCOUNTER — Ambulatory Visit: Payer: Self-pay

## 2017-02-23 DIAGNOSIS — H40051 Ocular hypertension, right eye: Secondary | ICD-10-CM | POA: Diagnosis not present

## 2017-02-23 DIAGNOSIS — H40013 Open angle with borderline findings, low risk, bilateral: Secondary | ICD-10-CM | POA: Diagnosis not present

## 2017-03-01 ENCOUNTER — Ambulatory Visit
Admission: RE | Admit: 2017-03-01 | Discharge: 2017-03-01 | Disposition: A | Discharge status: Home / Self Care | Payer: Medicare Other | Source: Ambulatory Visit | Attending: Cardiology | Admitting: Cardiology

## 2017-03-01 DIAGNOSIS — F17211 Nicotine dependence, cigarettes, in remission: Secondary | ICD-10-CM

## 2017-03-01 DIAGNOSIS — Z87891 Personal history of nicotine dependence: Secondary | ICD-10-CM | POA: Diagnosis not present

## 2017-03-14 DIAGNOSIS — M1990 Unspecified osteoarthritis, unspecified site: Secondary | ICD-10-CM | POA: Diagnosis not present

## 2017-03-14 DIAGNOSIS — I10 Essential (primary) hypertension: Secondary | ICD-10-CM | POA: Diagnosis not present

## 2017-03-14 DIAGNOSIS — E785 Hyperlipidemia, unspecified: Secondary | ICD-10-CM | POA: Diagnosis not present

## 2017-03-14 DIAGNOSIS — F17290 Nicotine dependence, other tobacco product, uncomplicated: Secondary | ICD-10-CM | POA: Diagnosis not present

## 2017-03-14 DIAGNOSIS — E119 Type 2 diabetes mellitus without complications: Secondary | ICD-10-CM | POA: Diagnosis not present

## 2017-03-14 DIAGNOSIS — R918 Other nonspecific abnormal finding of lung field: Secondary | ICD-10-CM | POA: Diagnosis not present

## 2017-04-15 ENCOUNTER — Ambulatory Visit (INDEPENDENT_AMBULATORY_CARE_PROVIDER_SITE_OTHER): Payer: Medicare Other | Admitting: Internal Medicine

## 2017-04-15 ENCOUNTER — Encounter: Payer: Self-pay | Admitting: Internal Medicine

## 2017-04-15 DIAGNOSIS — Z87891 Personal history of nicotine dependence: Secondary | ICD-10-CM | POA: Diagnosis not present

## 2017-04-15 DIAGNOSIS — R911 Solitary pulmonary nodule: Secondary | ICD-10-CM

## 2017-04-15 NOTE — Patient Instructions (Signed)
Nodule of right lung 34mm seen 03/01/17  - low intermediate prob for lung cancer  Plan - repeat ct chest super D protocol 6 months from 03/01/17  Followup  - return to see me after CT scan  Stopped smoking with greater than 30 pack year history Congratulations  Plan At time of next vist - do Pre-bd spiro and dlco only. No lung volume or bd response.

## 2017-04-15 NOTE — Progress Notes (Signed)
Subjective:    Patient ID: Jeffrey Hayes, male    DOB: [birth year], 66 y.o.   MRN: [id number]  PCP Wallene Huh, MD  HPI  IOV 04/15/2017  Chief Complaint  Patient presents with  . Pulmonary Consult    Pt referred by Dr. Terrence Dupont for right lung nodule. Pt denies SOB, CP/tightness, cough and f/c/s.     66 year old male reason Medicare. He is a Engineer, drilling works part-time. He stopped smoking 4 years ago. He is essentially asymptomatic. 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 He he says overall he is healthy. In particular he denies any shortness of breath, cough, wheeze, weight loss, hemoptysis, orthopnea, paroxysmal nocturnal dyspnea. He had lung cancer low-dose screening CT chest 03/01/2017 and found to have a right upper lobe 6 mm solid lung nodule and therefore he is here for evaluation. I personally visualized this CT image and confirmed.  There are no lab results to review other than a urine analysis from 2011 which was normal     has a past medical history of Hypertension.   reports that he quit smoking about 4 months ago. His smoking use included Cigarettes. He has a 37.50 pack-year smoking history. He has never used smokeless tobacco.  Past Surgical History:  Procedure Laterality Date  . CHALAZION EXCISION Left 03/07/2013   Procedure: EXCISION OF CYST LOWER LEFT EYELID;  Surgeon: Myrtha Mantis., MD;  Location: [facility];  Service: Ophthalmology;  Laterality: Left;  . ROTATOR CUFF REPAIR    . SHOULDER SURGERY Left    per pt - had a pin placed.     No Known Allergies   There is no immunization history on file for this patient.  Family History  Problem Relation Age of Onset  . Lung disease Father   . Lung disease Brother   . Alcohol abuse Brother   . Drug abuse Brother      Current Outpatient Prescriptions:  .  amLODipine (NORVASC) 10 MG tablet, Take 10 mg by mouth daily., Disp: , Rfl:  .  atorvastatin (LIPITOR)  10 MG tablet, Take 10 mg by mouth daily., Disp: , Rfl:  .  ibuprofen (ADVIL,MOTRIN) 800 MG tablet, Take 800 mg by mouth every 8 (eight) hours as needed for pain., Disp: , Rfl:     Review of Systems  Constitutional: Negative for fever and unexpected weight change.  HENT: Negative for congestion, dental problem, ear pain, nosebleeds, postnasal drip, rhinorrhea, sinus pressure, sneezing, sore throat and trouble swallowing.   Eyes: Negative for redness and itching.  Respiratory: Negative for cough, chest tightness, shortness of breath and wheezing.   Cardiovascular: Negative for palpitations and leg swelling.  Gastrointestinal: Negative for nausea and vomiting.  Genitourinary: Negative for dysuria.  Musculoskeletal: Negative for joint swelling.  Skin: Negative for rash.  Neurological: Negative for headaches.  Hematological: Does not bruise/bleed easily.  Psychiatric/Behavioral: Negative for dysphoric mood. The patient is not nervous/anxious.        Objective:   Physical Exam  Constitutional: He is oriented to person, place, and time. He appears well-developed and well-nourished. No distress.  HENT:  Head: Normocephalic and atraumatic.  Right Ear: External ear normal.  Left Ear: External ear normal.  Mouth/Throat: Oropharynx is clear and moist. No oropharyngeal exudate.  Eyes: Conjunctivae and EOM are normal. Pupils are equal, round, and reactive to light. Right eye exhibits no discharge. Left eye exhibits no discharge. No scleral icterus.  Neck: Normal range  of motion. Neck supple. No JVD present. No tracheal deviation present. No thyromegaly present.  Cardiovascular: Normal rate, regular rhythm and intact distal pulses.  Exam reveals no gallop and no friction rub.   No murmur heard. Pulmonary/Chest: Effort normal and breath sounds normal. No respiratory distress. He has no wheezes. He has no rales. He exhibits no tenderness.  Abdominal: Soft. Bowel sounds are normal. He exhibits no  distension and no mass. There is no tenderness. There is no rebound and no guarding.  Musculoskeletal: Normal range of motion. He exhibits no edema or tenderness.  Lymphadenopathy:    He has no cervical adenopathy.  Neurological: He is alert and oriented to person, place, and time. He has normal reflexes. No cranial nerve deficit. Coordination normal.  Skin: Skin is warm and dry. No rash noted. He is not diaphoretic. No erythema. No pallor.  Psychiatric: He has a normal mood and affect. His behavior is normal. Judgment and thought content normal.  Nursing note and vitals reviewed.   Vitals:   04/15/17 0909  BP: 138/82  Pulse: 78  SpO2: 98%  Weight: 192 lb 9.6 oz (87.4 kg)  Height: 6\' 3"  (1.905 m)    Estimated body mass index is 24.07 kg/m as calculated from the following:   Height as of this encounter: 6\' 3"  (1.905 m).   Weight as of this encounter: 192 lb 9.6 oz (87.4 kg).        Assessment & Plan:  Nodule of right lung 30mm seen 03/01/17  - low intermediate prob for lung cancer  Plan - repeat ct chest super D protocol 6 months from 03/01/17  Followup  - return to see me after CT scan  Stopped smoking with greater than 30 pack year history Congratulations  Plan At time of next vist - do Pre-bd spiro and dlco only. No lung volume or bd response.     Dr. Brand Males, M.D., [facility].C.P Pulmonary and Critical Care Medicine Staff Physician [facility] Pager: [phone], If no answer or between  15:00h - 7:00h: call [phone]  04/15/2017 9:28 AM

## 2017-04-15 NOTE — Assessment & Plan Note (Signed)
Congratulations  Plan At time of next vist - do Pre-bd spiro and dlco only. No lung volume or bd response.

## 2017-04-15 NOTE — Assessment & Plan Note (Signed)
62mm seen 03/01/17  - low intermediate prob for lung cancer  Plan - repeat ct chest super D protocol 6 months from 03/01/17  Followup  - return to see me after CT scan

## 2017-06-13 DIAGNOSIS — I10 Essential (primary) hypertension: Secondary | ICD-10-CM | POA: Diagnosis not present

## 2017-06-13 DIAGNOSIS — E119 Type 2 diabetes mellitus without complications: Secondary | ICD-10-CM | POA: Diagnosis not present

## 2017-06-13 DIAGNOSIS — E785 Hyperlipidemia, unspecified: Secondary | ICD-10-CM | POA: Diagnosis not present

## 2017-06-13 DIAGNOSIS — F17290 Nicotine dependence, other tobacco product, uncomplicated: Secondary | ICD-10-CM | POA: Diagnosis not present

## 2017-06-13 DIAGNOSIS — M1990 Unspecified osteoarthritis, unspecified site: Secondary | ICD-10-CM | POA: Diagnosis not present

## 2017-06-21 DIAGNOSIS — H35371 Puckering of macula, right eye: Secondary | ICD-10-CM | POA: Diagnosis not present

## 2017-06-21 DIAGNOSIS — E119 Type 2 diabetes mellitus without complications: Secondary | ICD-10-CM | POA: Diagnosis not present

## 2017-06-21 DIAGNOSIS — H35033 Hypertensive retinopathy, bilateral: Secondary | ICD-10-CM | POA: Diagnosis not present

## 2017-06-21 DIAGNOSIS — H40013 Open angle with borderline findings, low risk, bilateral: Secondary | ICD-10-CM | POA: Diagnosis not present

## 2017-06-21 DIAGNOSIS — H40051 Ocular hypertension, right eye: Secondary | ICD-10-CM | POA: Diagnosis not present

## 2017-07-07 DIAGNOSIS — I10 Essential (primary) hypertension: Secondary | ICD-10-CM | POA: Diagnosis not present

## 2017-07-07 DIAGNOSIS — E785 Hyperlipidemia, unspecified: Secondary | ICD-10-CM | POA: Diagnosis not present

## 2017-09-02 ENCOUNTER — Ambulatory Visit (INDEPENDENT_AMBULATORY_CARE_PROVIDER_SITE_OTHER)
Admission: RE | Admit: 2017-09-02 | Discharge: 2017-09-02 | Disposition: A | Discharge status: Home / Self Care | Payer: Medicare Other | Source: Ambulatory Visit | Attending: Internal Medicine | Admitting: Internal Medicine

## 2017-09-02 DIAGNOSIS — R911 Solitary pulmonary nodule: Secondary | ICD-10-CM | POA: Diagnosis not present

## 2017-09-02 DIAGNOSIS — R918 Other nonspecific abnormal finding of lung field: Secondary | ICD-10-CM | POA: Diagnosis not present

## 2017-09-14 DIAGNOSIS — F17290 Nicotine dependence, other tobacco product, uncomplicated: Secondary | ICD-10-CM | POA: Diagnosis not present

## 2017-09-14 DIAGNOSIS — E119 Type 2 diabetes mellitus without complications: Secondary | ICD-10-CM | POA: Diagnosis not present

## 2017-09-14 DIAGNOSIS — I10 Essential (primary) hypertension: Secondary | ICD-10-CM | POA: Diagnosis not present

## 2017-09-14 DIAGNOSIS — M1990 Unspecified osteoarthritis, unspecified site: Secondary | ICD-10-CM | POA: Diagnosis not present

## 2017-09-14 DIAGNOSIS — E785 Hyperlipidemia, unspecified: Secondary | ICD-10-CM | POA: Diagnosis not present

## 2017-10-17 ENCOUNTER — Ambulatory Visit: Payer: Medicare Other | Admitting: Internal Medicine

## 2017-11-07 ENCOUNTER — Ambulatory Visit (INDEPENDENT_AMBULATORY_CARE_PROVIDER_SITE_OTHER): Payer: Medicare Other | Admitting: Internal Medicine

## 2017-11-07 ENCOUNTER — Encounter: Payer: Self-pay | Admitting: Internal Medicine

## 2017-11-07 VITALS — BP 138/84 | HR 91 | Ht 72.75 in | Wt 194.0 lb

## 2017-11-07 DIAGNOSIS — J439 Emphysema, unspecified: Secondary | ICD-10-CM

## 2017-11-07 DIAGNOSIS — Z87891 Personal history of nicotine dependence: Secondary | ICD-10-CM

## 2017-11-07 DIAGNOSIS — Z23 Encounter for immunization: Secondary | ICD-10-CM

## 2017-11-07 DIAGNOSIS — I2584 Coronary atherosclerosis due to calcified coronary lesion: Secondary | ICD-10-CM

## 2017-11-07 DIAGNOSIS — R911 Solitary pulmonary nodule: Secondary | ICD-10-CM

## 2017-11-07 DIAGNOSIS — I2510 Atherosclerotic heart disease of native coronary artery without angina pectoris: Secondary | ICD-10-CM

## 2017-11-07 MED ORDER — ACLIDINIUM BROMIDE 400 MCG/ACT IN AEPB: 1.0000 | INHALATION_SPRAY | Freq: Two times a day (BID) | RESPIRATORY_TRACT | 0 refills | Status: DC

## 2017-11-07 NOTE — Patient Instructions (Addendum)
ICD-10-CM   1. Nodule of right lung R91.1   2. Stopped smoking with greater than 30 pack year history Z87.891   3. Pulmonary emphysema, unspecified emphysema type ([facility]) J43.9   4. Coronary artery calcification I25.10    I25.84      Nodule of right lung 110mm seen 03/01/17  And stable sept 2018- low intermediate prob for lung cancer  Plan - repeat ct chest wo contst in April/may 2019  - return to see me after CT scan  Stopped smoking with greater than 30 pack year history Congratulations  EMphysema lung  - start tudorza sample 1 puff twice daily; let me knw how it goes with this on board while at work - high dose flu shot 11/07/2017   Coronary artery calcification   - we discussed and agreed to hold off cardiology referral   Plan April/may 2019 after ct chest

## 2017-11-07 NOTE — Progress Notes (Signed)
Subjective:     Patient ID: Jeffrey Hayes, male   DOB: Sep 03, 1951, 66 y.o.   MRN: [id number]  HPI   PCP Jeffrey Huh, MD  HPI  IOV 04/15/2017  Chief Complaint  Patient presents with  . Pulmonary Consult    Pt referred by Dr. Terrence Hayes for right lung nodule. Pt denies SOB, CP/tightness, cough and f/c/s.     66 year old male reason Medicare. He is a Engineer, drilling works part-time. He stopped smoking 4 years ago. He is essentially asymptomatic. 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 He he says overall he is healthy. In particular he denies any shortness of breath, cough, wheeze, weight loss, hemoptysis, orthopnea, paroxysmal nocturnal dyspnea. He had lung cancer low-dose screening CT chest 03/01/2017 and found to have a right upper lobe 6 mm solid lung nodule and therefore he is here for evaluation. I personally visualized this CT image and confirmed.  There are no lab results to review other than a urine analysis from 2011 which was normal     has a past medical history of Hypertension.   reports that he quit smoking about 4 months ago. His smoking use included Cigarettes. He has a 37.50 pack-year smoking history. He has never used smokeless tobacco.   OV 11/07/2017  Chief Complaint  Patient presents with  . Follow-up    PFT done today. Denies any complaints of cough, SOB, or CP.    Follow-up lung nodule: He had a CT scan in March 2018 that showed lung nodule. Follow-up CT scan in September 2018) stable lung nodule Follow-up smoking: This continues to be under remission Follow-up working as a Engineer, drilling: He denies any dyspnea in the past. I again spent some time talking about this. He says that when he lifts heavy bricks in the wheelbarrow he does get a little bit tired and dyspneic. The CT scan does show some emphysema. His pulmonary function tests is close to normal but for isolated reduction in diffusion capacity. There is no ILD. He is willing to try a  sample inhaler for a few weeks to see if this would make a difference New findings: CT scan shows coronary artery calcification but he does not have chest pain.    IMPRESSION: 1. Similar appearance of bilateral pulmonary nodules, including the right upper lobe nodule of interest. The patient should be returned to the screening population with repeat screening exam on or after 03/01/2018. 2.  Emphysema (ICD10-J43.9). 3. Coronary artery atherosclerosis. Aortic Atherosclerosis (ICD10-I70.0). 4. Right adrenal enlargement may represent hyperplasia or an underlying nodule. This is similar. 5. New trace right pleural fluid.   Electronically Signed   By: Jeffrey Hayes M.D.   On: 09/02/2017 09:37     Results for Jeffrey, Hayes (MRN [id number]) as of 11/07/2017 17:14  Ref. Range 11/07/2017 15:57  FVC-Pre Latest Units: L 3.43  FVC-%Pred-Pre Latest Units: % 79  FEV1-Pre Latest Units: L 2.61  FEV1-%Pred-Pre Latest Units: % 78  Pre FEV1/FVC ratio Latest Units: % 76  FEV1FVC-%Pred-Pre Latest Units: % 98  FEF 25-75 Pre Latest Units: L/sec 2.10  FEF2575-%Pred-Pre Latest Units: % 71  FEV6-Pre Latest Units: L 3.40  FEV6-%Pred-Pre Latest Units: % 81  Pre FEV6/FVC Ratio Latest Units: % 99  FEV6FVC-%Pred-Pre Latest Units: % 103  DLCO cor Latest Units: ml/min/mmHg 24.93  DLCO cor % pred Latest Units: % 69  DLCO unc Latest Units: ml/min/mmHg 25.07  DLCO unc % pred Latest Units: % 69  DL/VA Latest  Units: ml/min/mmHg/L 4.51  DL/VA % pred Latest Units: % 94       has a past medical history of Hypertension.   reports that he quit smoking about 11 months ago. His smoking use included cigarettes. He has a 37.50 pack-year smoking history. he has never used smokeless tobacco.  Past Surgical History:  Procedure Laterality Date  . CHALAZION EXCISION Left 03/07/2013   Procedure: EXCISION OF CYST LOWER LEFT EYELID;  Surgeon: Jeffrey Hayes., MD;  Location: [place];  Service:  Ophthalmology;  Laterality: Left;  . ROTATOR CUFF REPAIR    . SHOULDER SURGERY Left    per pt - had a pin placed.     No Known Allergies   There is no immunization history on file for this patient.  Family History  Problem Relation Age of Onset  . Lung disease Father   . Lung disease Brother   . Alcohol abuse Brother   . Drug abuse Brother      Current Outpatient Medications:  .  amLODipine (NORVASC) 10 MG tablet, Take 10 mg by mouth daily., Disp: , Rfl:  .  atorvastatin (LIPITOR) 10 MG tablet, Take 10 mg by mouth daily., Disp: , Rfl:  .  metoprolol succinate (TOPROL-XL) 50 MG 24 hr tablet, , Disp: , Rfl:   Review of Systems     Objective:   Physical Exam Vitals:   11/07/17 1630  BP: 138/84  Pulse: 91  SpO2: 98%  Weight: 194 lb (88 kg)  Height: 6' 0.75" (1.848 m)    Discussion only visit    Assessment:       ICD-10-CM   1. Nodule of right lung R91.1   2. Stopped smoking with greater than 30 pack year history Z87.891   3. Pulmonary emphysema, unspecified emphysema type ([place]) J43.9   4. Coronary artery calcification I25.10    I25.84        Plan:       Nodule of right lung 83mm seen 03/01/17  And stable sept 2018- low intermediate prob for lung cancer  Plan - repeat ct chest wo contst in April/may 2019  - return to see me after CT scan  Stopped smoking with greater than 30 pack year history Congratulations  EMphysema lung  - start tudorza sample 1 puff twice daily; let me knw how it goes with this on board while at work - high dose flu shot 11/07/2017   Coronary artery calcification   - we discussed and agreed to hold off cardiology referral   Plan April/may 2019 after ct chest  (> 50% of this 15 min visit spent in face to face counseling or/and coordination of care)   Dr. Brand Hayes, M.D., [facility].C.P Pulmonary and Critical Care Medicine Staff Physician, [place] Director - Interstitial Lung Disease  Program  Pulmonary  [facility] [place], [place], [address]  Pager: [phone], If no answer or between  15:00h - 7:00h: call [phone] Telephone: [phone]

## 2017-11-07 NOTE — Progress Notes (Signed)
PFT done today. 

## 2017-11-07 NOTE — Addendum Note (Signed)
Addended by: Lorretta Harp on: 11/07/2017 05:30 PM   Modules accepted: Orders

## 2017-12-09 DIAGNOSIS — E119 Type 2 diabetes mellitus without complications: Secondary | ICD-10-CM | POA: Diagnosis not present

## 2017-12-09 DIAGNOSIS — F17290 Nicotine dependence, other tobacco product, uncomplicated: Secondary | ICD-10-CM | POA: Diagnosis not present

## 2017-12-09 DIAGNOSIS — E785 Hyperlipidemia, unspecified: Secondary | ICD-10-CM | POA: Diagnosis not present

## 2017-12-09 DIAGNOSIS — M1990 Unspecified osteoarthritis, unspecified site: Secondary | ICD-10-CM | POA: Diagnosis not present

## 2017-12-09 DIAGNOSIS — I10 Essential (primary) hypertension: Secondary | ICD-10-CM | POA: Diagnosis not present

## 2017-12-09 DIAGNOSIS — K219 Gastro-esophageal reflux disease without esophagitis: Secondary | ICD-10-CM | POA: Diagnosis not present

## 2017-12-23 ENCOUNTER — Other Ambulatory Visit: Payer: Self-pay

## 2017-12-23 ENCOUNTER — Encounter (HOSPITAL_COMMUNITY): Payer: Self-pay | Admitting: Emergency Medicine

## 2017-12-23 ENCOUNTER — Ambulatory Visit (INDEPENDENT_AMBULATORY_CARE_PROVIDER_SITE_OTHER): Payer: Medicare Other

## 2017-12-23 ENCOUNTER — Ambulatory Visit (HOSPITAL_COMMUNITY)
Admission: EM | Admit: 2017-12-23 | Discharge: 2017-12-23 | Disposition: A | Discharge status: Home / Self Care | Payer: Medicare Other | Attending: Emergency Medicine | Admitting: Emergency Medicine

## 2017-12-23 DIAGNOSIS — R140 Abdominal distension (gaseous): Secondary | ICD-10-CM | POA: Diagnosis not present

## 2017-12-23 DIAGNOSIS — R1900 Intra-abdominal and pelvic swelling, mass and lump, unspecified site: Secondary | ICD-10-CM | POA: Diagnosis not present

## 2017-12-23 DIAGNOSIS — R109 Unspecified abdominal pain: Secondary | ICD-10-CM

## 2017-12-23 DIAGNOSIS — K567 Ileus, unspecified: Secondary | ICD-10-CM

## 2017-12-23 MED ORDER — GI COCKTAIL ~~LOC~~
30.0000 mL | Freq: Once | ORAL | Status: AC
Start: 2017-12-23 — End: 2017-12-23
  Administered 2017-12-23: 30 mL via ORAL

## 2017-12-23 MED ORDER — GI COCKTAIL ~~LOC~~
ORAL | Status: AC
  Filled 2017-12-23: qty 30

## 2017-12-23 NOTE — ED Provider Notes (Signed)
Jeffrey Hayes    CSN: [id number] Arrival date & time: 12/23/17  0957     History   Chief Complaint Chief Complaint  Patient presents with  . Bloated    HPI Jeffrey Hayes is a 67 y.o. male.   Jeffrey Hayes presents with complaints of abdominal pain and bloating which has been progressing over the past three weeks. Causes decreased appetite. He feels he may have noticed a "knot" near his umbilicus when he engages his abdominal muscles with transitioning from laying to sitting. Without specific pain to this region. Denies nausea or vomiting but states he feels full due to "tightness" of abdomen. Has been having normal daily bowel movements. Has noticed some increased belching, but at times he feels he needs to belch and cannot. Occasional heart burn. Denies any previous abdominal history or surgeries, does take omeprazole daily. Rates pain currently 2/10. Has tried pepto bismol which did not seem to help much. Without fevers. History of hypertension, lung nodule, and shoulder surgery.    ROS per HPI.       Past Medical History:  Diagnosis Date  . Hypertension     Patient Active Problem List   Diagnosis Date Noted  . Nodule of right lung 04/15/2017  . Stopped smoking with greater than 30 pack year history 04/15/2017    Past Surgical History:  Procedure Laterality Date  . CHALAZION EXCISION Left 03/07/2013   Procedure: EXCISION OF CYST LOWER LEFT EYELID;  Surgeon: Jeffrey Hayes., MD;  Location: [place];  Service: Ophthalmology;  Laterality: Left;  . ROTATOR CUFF REPAIR    . SHOULDER SURGERY Left    per pt - had a pin placed.        Home Medications    Prior to Admission medications   Medication Sig Start Date End Date Taking? Authorizing Provider  losartan (COZAAR) 25 MG tablet Take 25 mg by mouth daily.   Yes [provider]  omeprazole (PRILOSEC) 40 MG capsule Take 40 mg by mouth daily.   Yes [provider]    Aclidinium Bromide (TUDORZA PRESSAIR) 400 MCG/ACT AEPB Inhale 1 puff into the lungs 2 (two) times daily. 11/07/17   Jeffrey Males, MD  amLODipine (NORVASC) 10 MG tablet Take 10 mg by mouth daily.    [provider]  atorvastatin (LIPITOR) 10 MG tablet Take 10 mg by mouth daily.    [provider]  metoprolol succinate (TOPROL-XL) 50 MG 24 hr tablet  10/05/17   [provider]    Family History Family History  Problem Relation Age of Onset  . Lung disease Father   . Lung disease Brother   . Alcohol abuse Brother   . Drug abuse Brother     Social History Social History   Tobacco Use  . Smoking status: Former Smoker    Packs/day: 0.75    Years: 50.00    Pack years: 37.50    Types: Cigarettes    Last attempt to quit: 12/06/2016    Years since quitting: 1.0  . Smokeless tobacco: Never Used  Substance Use Topics  . Alcohol use: Yes    Comment: 1/5 liquor per week   . Drug use: No     Allergies   Patient has no known allergies.   Review of Systems Review of Systems   Physical Exam Triage Vital Signs ED Triage Vitals [12/23/17 1008]  Enc Vitals Group     BP (!) 147/88     Pulse  Rate 96     Resp 16     Temp 99.2 F (37.3 C)     Temp src      SpO2 97 %     Weight      Height      Head Circumference      Peak Flow      Pain Score      Pain Loc      Pain Edu?      Excl. in [place]?    No data found.  Updated Vital Signs BP (!) 147/88   Pulse 96   Temp 99.2 F (37.3 C)   Resp 16   SpO2 97%   Visual Acuity Right Eye Distance:   Left Eye Distance:   Bilateral Distance:    Right Eye Near:   Left Eye Near:    Bilateral Near:     Physical Exam  Constitutional: He is oriented to person, place, and time. He appears well-developed and well-nourished.  Cardiovascular: Normal rate and regular rhythm.  Pulmonary/Chest: Effort normal and breath sounds normal.  Abdominal: He exhibits distension. There is no hepatosplenomegaly or  splenomegaly. There is no tenderness. There is no rigidity, no rebound, no guarding, no CVA tenderness, no tenderness at McBurney's point and negative Murphy's sign. No hernia.  Without obvious hernia on palpation and with coughing, some protrusion of musculature near umbilicus with engagement with transition from lying to sitting; significant bloating to abdomen noted, generally non tender   Neurological: He is alert and oriented to person, place, and time.  Skin: Skin is warm and dry.     UC Treatments / Results  Labs (all labs ordered are listed, but only abnormal results are displayed) Labs Reviewed - No data to display  EKG  EKG Interpretation None       Radiology Dg Abd 2 Views  Result Date: 12/23/2017 CLINICAL DATA:  Swelling.  Bloating.  Loss of appetite and fatigue. EXAM: ABDOMEN - 2 VIEW COMPARISON:  CT chest 09/02/2017. FINDINGS: Soft tissue structures are unremarkable. Air-filled loops of small and large bowel. These are nondistended. Stool noted in colon. No free air. Degenerative changes thoracolumbar spine. IMPRESSION: Air-filled loops of nondilated small and large bowel. Stool in the colon. No bowel distention or free air. A mild adynamic ileus cannot be excluded. Follow-up exam can be obtained to demonstrate resolution. Electronically Signed   By: Jeffrey Hayes  Register   On: 12/23/2017 10:57    Procedures Procedures (including critical care time)  Medications Ordered in UC Medications  gi cocktail (Maalox,Lidocaine,Donnatal) (30 mLs Oral Given 12/23/17 1025)     Initial Impression / Assessment and Plan / UC Course  I have reviewed the triage vital signs and the nursing notes.  Pertinent labs & imaging results that were available during Jeffrey care of the patient were reviewed by me and considered in Jeffrey medical decision making (see chart for details).     Xray with concern for mild ileus, consistent with history and physical. No known triggers, without recent surgery  and is not on any narcotics. Non toxic in appearance at this time, vitals stable. Recommended supportive cares at home with follow up with PCP for recheck. Push fluids. Miralax. Increased physical activity. Patient verbalized understanding and agreeable to plan.  Return precautions provided. Ambulatory out of clinic without difficulty.    Final Clinical Impressions(s) / UC Diagnoses   Final diagnoses:  Bloating  Ileus of unspecified type [facility])    ED Discharge Orders  None       Controlled Substance Prescriptions [place] Controlled Substance Registry consulted? Not Applicable   Zigmund Gottron, NP 12/23/17 1121

## 2017-12-23 NOTE — Discharge Instructions (Signed)
Your xray does show increased gas as well as stool. Concern for ileus, see education provided on ileus. I would recommend increasing your fluid intake, stool softeners, increased activity/ walking to promote gastrointestinal activity. Please follow up with your primary care provider for a recheck in approximately 1 week. If you develop increased abdominal pain, vomiting, fevers or otherwise worsening please go to the ER

## 2017-12-23 NOTE — ED Triage Notes (Signed)
Pt states "I think I might have a hernia" pt endorses stomach swelling, feels like theres a knot in his stomach. Denies issues with bowels, or urination. Pt states when he is lying back and sits up, he notices a big knot on his belly. Denies pain.

## 2017-12-27 DIAGNOSIS — H40051 Ocular hypertension, right eye: Secondary | ICD-10-CM | POA: Diagnosis not present

## 2017-12-27 DIAGNOSIS — H40013 Open angle with borderline findings, low risk, bilateral: Secondary | ICD-10-CM | POA: Diagnosis not present

## 2018-01-10 ENCOUNTER — Other Ambulatory Visit: Payer: Self-pay | Admitting: Gastroenterology

## 2018-01-10 DIAGNOSIS — R634 Abnormal weight loss: Secondary | ICD-10-CM | POA: Diagnosis not present

## 2018-01-10 DIAGNOSIS — R1084 Generalized abdominal pain: Secondary | ICD-10-CM

## 2018-01-10 DIAGNOSIS — R194 Change in bowel habit: Secondary | ICD-10-CM | POA: Diagnosis not present

## 2018-01-10 DIAGNOSIS — K5900 Constipation, unspecified: Secondary | ICD-10-CM | POA: Diagnosis not present

## 2018-01-11 ENCOUNTER — Ambulatory Visit
Admission: RE | Admit: 2018-01-11 | Discharge: 2018-01-11 | Disposition: A | Discharge status: Home / Self Care | Payer: Medicare Other | Source: Ambulatory Visit | Attending: Gastroenterology | Admitting: Gastroenterology

## 2018-01-11 ENCOUNTER — Other Ambulatory Visit (HOSPITAL_COMMUNITY): Payer: Self-pay | Admitting: Gastroenterology

## 2018-01-11 DIAGNOSIS — K5900 Constipation, unspecified: Secondary | ICD-10-CM | POA: Diagnosis not present

## 2018-01-11 DIAGNOSIS — R1084 Generalized abdominal pain: Secondary | ICD-10-CM

## 2018-01-11 DIAGNOSIS — R188 Other ascites: Secondary | ICD-10-CM

## 2018-01-11 MED ORDER — IOPAMIDOL (ISOVUE-300) INJECTION 61%: 100.0000 mL | Freq: Once | INTRAVENOUS | Status: DC | PRN

## 2018-01-12 ENCOUNTER — Ambulatory Visit (HOSPITAL_COMMUNITY)
Admission: RE | Admit: 2018-01-12 | Discharge: 2018-01-12 | Disposition: A | Discharge status: Home / Self Care | Payer: Medicare Other | Source: Ambulatory Visit | Attending: Gastroenterology | Admitting: Gastroenterology

## 2018-01-12 ENCOUNTER — Encounter (HOSPITAL_COMMUNITY): Payer: Self-pay | Admitting: Student

## 2018-01-12 DIAGNOSIS — R188 Other ascites: Secondary | ICD-10-CM | POA: Diagnosis not present

## 2018-01-12 HISTORY — PX: IR PARACENTESIS: IMG2679

## 2018-01-12 LAB — BODY FLUID CELL COUNT WITH DIFFERENTIAL
EOS FL: 0 %
Lymphs, Fluid: 55 %
Monocyte-Macrophage-Serous Fluid: 4 % — ABNORMAL LOW (ref 50–90)
Neutrophil Count, Fluid: 41 % — ABNORMAL HIGH (ref 0–25)
WBC FLUID: 3106 uL — AB (ref 0–1000)

## 2018-01-12 LAB — PROTEIN, PLEURAL OR PERITONEAL FLUID: TOTAL PROTEIN, FLUID: 4.9 g/dL

## 2018-01-12 LAB — ALBUMIN, PLEURAL OR PERITONEAL FLUID: Albumin, Fluid: 2.6 g/dL

## 2018-01-12 MED ORDER — LIDOCAINE HCL (PF) 1 % IJ SOLN
INTRAMUSCULAR | Status: DC | PRN
  Administered 2018-01-12: 10 mL

## 2018-01-12 MED ORDER — LIDOCAINE 2% (20 MG/ML) 5 ML SYRINGE
INTRAMUSCULAR | Status: AC
  Filled 2018-01-12: qty 10

## 2018-01-12 NOTE — Procedures (Signed)
PROCEDURE SUMMARY:  Successful US guided paracentesis from right lateral abdomen.  Yielded 2.7 of yellow fluid.  No immediate complications.  Pt tolerated well.   Specimen amber sent for labs.  Docia Barrier PA-C 01/12/2018 9:37 AM

## 2018-01-18 ENCOUNTER — Other Ambulatory Visit (HOSPITAL_COMMUNITY): Payer: Self-pay | Admitting: Gastroenterology

## 2018-01-18 DIAGNOSIS — C482 Malignant neoplasm of peritoneum, unspecified: Secondary | ICD-10-CM

## 2018-01-18 DIAGNOSIS — R188 Other ascites: Secondary | ICD-10-CM | POA: Diagnosis not present

## 2018-01-18 DIAGNOSIS — C786 Secondary malignant neoplasm of retroperitoneum and peritoneum: Secondary | ICD-10-CM | POA: Diagnosis not present

## 2018-01-18 DIAGNOSIS — R1084 Generalized abdominal pain: Secondary | ICD-10-CM | POA: Diagnosis not present

## 2018-01-20 ENCOUNTER — Encounter (HOSPITAL_COMMUNITY): Payer: Self-pay | Admitting: General Surgery

## 2018-01-20 ENCOUNTER — Ambulatory Visit (HOSPITAL_COMMUNITY)
Admission: RE | Admit: 2018-01-20 | Discharge: 2018-01-20 | Disposition: A | Discharge status: Home / Self Care | Payer: Medicare Other | Source: Ambulatory Visit | Attending: Gastroenterology | Admitting: Gastroenterology

## 2018-01-20 DIAGNOSIS — R188 Other ascites: Secondary | ICD-10-CM | POA: Insufficient documentation

## 2018-01-20 HISTORY — PX: IR PARACENTESIS: IMG2679

## 2018-01-20 LAB — BODY FLUID CELL COUNT WITH DIFFERENTIAL
EOS FL: NONE SEEN %
LYMPHS FL: 49 %
MONOCYTE-MACROPHAGE-SEROUS FLUID: 2 % — AB (ref 50–90)
Neutrophil Count, Fluid: 49 % — ABNORMAL HIGH (ref 0–25)
Total Nucleated Cell Count, Fluid: 3206 cu mm — ABNORMAL HIGH (ref 0–1000)

## 2018-01-20 LAB — GRAM STAIN

## 2018-01-20 LAB — ALBUMIN, PLEURAL OR PERITONEAL FLUID: Albumin, Fluid: 2.2 g/dL

## 2018-01-20 LAB — PROTEIN, PLEURAL OR PERITONEAL FLUID: TOTAL PROTEIN, FLUID: 4.5 g/dL

## 2018-01-20 LAB — LACTATE DEHYDROGENASE, PLEURAL OR PERITONEAL FLUID: LD, Fluid: 279 U/L — ABNORMAL HIGH (ref 3–23)

## 2018-01-20 LAB — GLUCOSE, PLEURAL OR PERITONEAL FLUID: GLUCOSE FL: 102 mg/dL

## 2018-01-20 MED ORDER — LIDOCAINE 2% (20 MG/ML) 5 ML SYRINGE
INTRAMUSCULAR | Status: AC
  Filled 2018-01-20: qty 10

## 2018-01-20 MED ORDER — LIDOCAINE HCL 1 % IJ SOLN
INTRAMUSCULAR | Status: DC | PRN
  Administered 2018-01-20: 10 mL

## 2018-01-20 NOTE — Procedures (Signed)
Ultrasound-guided diagnostic and therapeutic paracentesis performed yielding 2.4 liters of serous colored fluid. No immediate complications.  Jeffrey Hayes 9:46 AM 01/20/2018

## 2018-01-24 ENCOUNTER — Other Ambulatory Visit: Payer: Self-pay | Admitting: General Surgery

## 2018-01-24 ENCOUNTER — Other Ambulatory Visit: Payer: Self-pay | Admitting: Radiology

## 2018-01-25 ENCOUNTER — Ambulatory Visit (HOSPITAL_COMMUNITY)
Admission: RE | Admit: 2018-01-25 | Discharge: 2018-01-25 | Disposition: A | Discharge status: Home / Self Care | Payer: Medicare Other | Source: Ambulatory Visit | Attending: Gastroenterology | Admitting: Gastroenterology

## 2018-01-25 ENCOUNTER — Encounter (HOSPITAL_COMMUNITY): Payer: Self-pay

## 2018-01-25 DIAGNOSIS — K668 Other specified disorders of peritoneum: Secondary | ICD-10-CM | POA: Diagnosis not present

## 2018-01-25 DIAGNOSIS — L929 Granulomatous disorder of the skin and subcutaneous tissue, unspecified: Secondary | ICD-10-CM | POA: Insufficient documentation

## 2018-01-25 DIAGNOSIS — C482 Malignant neoplasm of peritoneum, unspecified: Secondary | ICD-10-CM

## 2018-01-25 DIAGNOSIS — K658 Other peritonitis: Secondary | ICD-10-CM | POA: Diagnosis not present

## 2018-01-25 DIAGNOSIS — M7989 Other specified soft tissue disorders: Secondary | ICD-10-CM | POA: Diagnosis present

## 2018-01-25 LAB — CBC
HCT: 48.4 % (ref 39.0–52.0)
Hemoglobin: 16 g/dL (ref 13.0–17.0)
MCH: 26.1 pg (ref 26.0–34.0)
MCHC: 33.1 g/dL (ref 30.0–36.0)
MCV: 79.1 fL (ref 78.0–100.0)
PLATELETS: 142 10*3/uL — AB (ref 150–400)
RBC: 6.12 MIL/uL — AB (ref 4.22–5.81)
RDW: 14.5 % (ref 11.5–15.5)
WBC: 6.8 10*3/uL (ref 4.0–10.5)

## 2018-01-25 LAB — PROTIME-INR
INR: 1.15
PROTHROMBIN TIME: 14.6 s (ref 11.4–15.2)

## 2018-01-25 LAB — CULTURE, BODY FLUID W GRAM STAIN -BOTTLE

## 2018-01-25 LAB — CULTURE, BODY FLUID-BOTTLE: CULTURE: NO GROWTH

## 2018-01-25 MED ORDER — MIDAZOLAM HCL 2 MG/2ML IJ SOLN
INTRAMUSCULAR | Status: AC | PRN
  Administered 2018-01-25 (×2): 1 mg via INTRAVENOUS

## 2018-01-25 MED ORDER — SODIUM CHLORIDE 0.9 % IV SOLN: INTRAVENOUS | Status: DC

## 2018-01-25 MED ORDER — HYDROCODONE-ACETAMINOPHEN 5-325 MG PO TABS: 1.0000 | ORAL_TABLET | ORAL | Status: DC | PRN

## 2018-01-25 MED ORDER — FENTANYL CITRATE (PF) 100 MCG/2ML IJ SOLN
INTRAMUSCULAR | Status: AC | PRN
  Administered 2018-01-25 (×2): 50 ug via INTRAVENOUS

## 2018-01-25 MED ORDER — MIDAZOLAM HCL 2 MG/2ML IJ SOLN
INTRAMUSCULAR | Status: AC
  Filled 2018-01-25: qty 4

## 2018-01-25 MED ORDER — LIDOCAINE HCL 1 % IJ SOLN
INTRAMUSCULAR | Status: AC
  Filled 2018-01-25: qty 20

## 2018-01-25 MED ORDER — FENTANYL CITRATE (PF) 100 MCG/2ML IJ SOLN
INTRAMUSCULAR | Status: AC
  Filled 2018-01-25: qty 4

## 2018-01-25 NOTE — H&P (Signed)
Chief Complaint: omental nodules  Referring Physician:Dr. Wilford Corner  Supervising Physician: Markus Daft  Patient Status: [name] - Out-pt  HPI: Jeffrey Hayes is a 67 y.o. male with a history of cirrhosis with abdominal ascites that has had 2 previous paracenteses recently.  He had a CT scan as well that revealed soft tissue omental infiltration.  He has been referred here today for a biopsy to help determine an etiology.  He denies any other complaints except his fluid has returned since his paracentesis last week.  Past Medical History:  Past Medical History:  Diagnosis Date  . Hypertension     Past Surgical History:  Past Surgical History:  Procedure Laterality Date  . CHALAZION EXCISION Left 03/07/2013   Procedure: EXCISION OF CYST LOWER LEFT EYELID;  Surgeon: Myrtha Mantis., MD;  Location: [place];  Service: Ophthalmology;  Laterality: Left;  . IR PARACENTESIS  01/12/2018  . IR PARACENTESIS  01/20/2018  . ROTATOR CUFF REPAIR    . SHOULDER SURGERY Left    per pt - had a pin placed.     Family History:  Family History  Problem Relation Age of Onset  . Lung disease Father   . Lung disease Brother   . Alcohol abuse Brother   . Drug abuse Brother     Social History:  reports that he quit smoking about 13 months ago. His smoking use included cigarettes. He has a 37.50 pack-year smoking history. he has never used smokeless tobacco. He reports that he drinks alcohol. He reports that he does not use drugs.  Allergies: No Known Allergies  Medications: Medications reviewed in epic  Please HPI for pertinent positives, otherwise complete 10 system ROS negative.  Mallampati Score: MD Evaluation Airway: WNL Heart: WNL Abdomen: WNL Chest/ Lungs: WNL ASA  Classification: 2 Mallampati/Airway Score: Two  Physical Exam: BP 114/76   Pulse 75   Temp 98.2 F (36.8 C)   Ht 6\' 3"  (1.905 m)   Wt 187 lb (84.8 kg)   SpO2 100%   BMI 23.37 kg/m   Body mass index is 23.37 kg/m. General: pleasant, WD, WN black male who is laying in bed in NAD HEENT: head is normocephalic, atraumatic.  Sclera are noninjected.  PERRL.  Ears and nose without any masses or lesions.  Mouth is pink and moist Heart: regular, rate, and rhythm.  Normal s1,s2. No obvious murmurs, gallops, or rubs noted.  Palpable radial and pedal pulses bilaterally Lungs: CTAB, no wheezes, rhonchi, or rales noted.  Respiratory effort nonlabored Abd: soft, NT, some fluid wave noted, +BS, no masses, hernias, or organomegaly Psych: A&Ox3 with an appropriate affect.   Labs: Results for orders placed or performed during the hospital encounter of 01/25/18 (from the past 48 hour(s))  CBC upon arrival     Status: Abnormal   Collection Time: 01/25/18  9:29 AM  Result Value Ref Range   WBC 6.8 4.0 - 10.5 K/uL   RBC 6.12 (H) 4.22 - 5.81 MIL/uL   Hemoglobin 16.0 13.0 - 17.0 g/dL   HCT 48.4 39.0 - 52.0 %   MCV 79.1 78.0 - 100.0 fL   MCH 26.1 26.0 - 34.0 pg   MCHC 33.1 30.0 - 36.0 g/dL   RDW 14.5 11.5 - 15.5 %   Platelets 142 (L) 150 - 400 K/uL    Comment: Performed at [facility], [place] [address], [place], [place] [address]  Protime-INR     Status: None  Collection Time: 01/25/18 10:12 AM  Result Value Ref Range   Prothrombin Time 14.6 11.4 - 15.2 seconds   INR 1.15     Comment: Performed at [facility] [address], [place], [place] [address]    Imaging: No results found.  Assessment/Plan 1. Soft tissue omental infiltration, ascites  Plan to proceed today with an omental biopsy as well as possible paracentesis.  Labs and vitals reviewed. Risks and benefits discussed with the patient including, but not limited to bleeding, infection, damage to adjacent structures or low yield requiring additional tests.  All of the patient's questions were answered, patient is agreeable to proceed. Consent signed and in chart.   Thank you for this interesting consult.  I  greatly enjoyed meeting Jeffrey Hayes and look forward to participating in their care.  A copy of this report was sent to the requesting provider on this date.  Electronically Signed: Henreitta Cea 01/25/2018, 11:10 AM   I spent a total of  30 Minutes   in face to face in clinical consultation, greater than 50% of which was counseling/coordinating care for omental infiltration, ascites

## 2018-01-25 NOTE — Sedation Documentation (Signed)
Bandaid L flank intact

## 2018-01-25 NOTE — Discharge Instructions (Signed)
Needle Biopsy, Care After °These instructions give you information about caring for yourself after your procedure. Your doctor may also give you more specific instructions. Call your doctor if you have any problems or questions after your procedure. °Follow these instructions at home: °· Rest as told by your doctor. °· Take medicines only as told by your doctor. °· There are many different ways to close and cover the biopsy site, including stitches (sutures), skin glue, and adhesive strips. Follow instructions from your doctor about: °? How to take care of your biopsy site. °? When and how you should change your bandage (dressing). °? When you should remove your dressing. °? Removing whatever was used to close your biopsy site. °· Check your biopsy site every day for signs of infection. Watch for: °? Redness, swelling, or pain. °? Fluid, blood, or pus. °Contact a doctor if: °· You have a fever. °· You have redness, swelling, or pain at the biopsy site, and it lasts longer than a few days. °· You have fluid, blood, or pus coming from the biopsy site. °· You feel sick to your stomach (nauseous). °· You throw up (vomit). °Get help right away if: °· You are short of breath. °· You have trouble breathing. °· Your chest hurts. °· You feel dizzy or you pass out (faint). °· You have bleeding that does not stop with pressure or a bandage. °· You cough up blood. °· Your belly (abdomen) hurts. °This information is not intended to replace advice given to you by your health care provider. Make sure you discuss any questions you have with your health care provider. °Document Released: 11/04/2008 Document Revised: 04/29/2016 Document Reviewed: 11/18/2014 °Elsevier Interactive Patient Education © 2018 [employer] ° °

## 2018-01-25 NOTE — Procedures (Signed)
CT guided core biopsies of omental/peritoneal thickening in left lateral abdomen.  6 cores obtained.  Minimal blood loss and no immediate complication.

## 2018-01-25 NOTE — Sedation Documentation (Signed)
Patient is resting comfortably. 

## 2018-02-09 ENCOUNTER — Other Ambulatory Visit (HOSPITAL_COMMUNITY): Payer: Self-pay | Admitting: Gastroenterology

## 2018-02-09 DIAGNOSIS — R188 Other ascites: Secondary | ICD-10-CM

## 2018-02-10 ENCOUNTER — Ambulatory Visit (HOSPITAL_COMMUNITY)
Admission: RE | Admit: 2018-02-10 | Discharge: 2018-02-10 | Disposition: A | Discharge status: Home / Self Care | Payer: Medicare Other | Source: Ambulatory Visit | Attending: Gastroenterology | Admitting: Gastroenterology

## 2018-02-10 ENCOUNTER — Encounter (HOSPITAL_COMMUNITY): Payer: Self-pay | Admitting: Physician Assistant

## 2018-02-10 DIAGNOSIS — R188 Other ascites: Secondary | ICD-10-CM

## 2018-02-10 DIAGNOSIS — K7460 Unspecified cirrhosis of liver: Secondary | ICD-10-CM | POA: Diagnosis not present

## 2018-02-10 HISTORY — PX: IR PARACENTESIS: IMG2679

## 2018-02-10 MED ORDER — LIDOCAINE HCL (PF) 2 % IJ SOLN
INTRAMUSCULAR | Status: DC | PRN
  Administered 2018-02-10: 10 mL

## 2018-02-10 MED ORDER — LIDOCAINE HCL (PF) 2 % IJ SOLN
INTRAMUSCULAR | Status: AC
  Filled 2018-02-10: qty 10

## 2018-02-10 NOTE — Procedures (Signed)
PROCEDURE SUMMARY:  Successful US guided paracentesis from right lateral abdomen.  Yielded 4 liters of clear yellow fluid.  No immediate complications.  Pt tolerated well.    WENDY S BLAIR PA-C 02/10/2018 11:53 AM

## 2018-02-13 DIAGNOSIS — Z6823 Body mass index (BMI) 23.0-23.9, adult: Secondary | ICD-10-CM | POA: Diagnosis not present

## 2018-02-13 DIAGNOSIS — D869 Sarcoidosis, unspecified: Secondary | ICD-10-CM | POA: Diagnosis not present

## 2018-02-13 DIAGNOSIS — R188 Other ascites: Secondary | ICD-10-CM | POA: Diagnosis not present

## 2018-02-13 DIAGNOSIS — R0609 Other forms of dyspnea: Secondary | ICD-10-CM | POA: Diagnosis not present

## 2018-03-10 DIAGNOSIS — I10 Essential (primary) hypertension: Secondary | ICD-10-CM | POA: Diagnosis not present

## 2018-03-10 DIAGNOSIS — F17290 Nicotine dependence, other tobacco product, uncomplicated: Secondary | ICD-10-CM | POA: Diagnosis not present

## 2018-03-10 DIAGNOSIS — K219 Gastro-esophageal reflux disease without esophagitis: Secondary | ICD-10-CM | POA: Diagnosis not present

## 2018-03-10 DIAGNOSIS — E785 Hyperlipidemia, unspecified: Secondary | ICD-10-CM | POA: Diagnosis not present

## 2018-03-10 DIAGNOSIS — E119 Type 2 diabetes mellitus without complications: Secondary | ICD-10-CM | POA: Diagnosis not present

## 2018-03-10 DIAGNOSIS — M1990 Unspecified osteoarthritis, unspecified site: Secondary | ICD-10-CM | POA: Diagnosis not present

## 2018-03-11 ENCOUNTER — Other Ambulatory Visit: Payer: Self-pay

## 2018-03-11 ENCOUNTER — Emergency Department (HOSPITAL_COMMUNITY)
Admission: EM | Admit: 2018-03-11 | Discharge: 2018-03-11 | Disposition: A | Discharge status: Home / Self Care | Payer: Medicare Other | Attending: Emergency Medicine | Admitting: Emergency Medicine

## 2018-03-11 ENCOUNTER — Encounter (HOSPITAL_COMMUNITY): Payer: Self-pay

## 2018-03-11 DIAGNOSIS — E860 Dehydration: Secondary | ICD-10-CM | POA: Insufficient documentation

## 2018-03-11 DIAGNOSIS — E1165 Type 2 diabetes mellitus with hyperglycemia: Secondary | ICD-10-CM | POA: Insufficient documentation

## 2018-03-11 DIAGNOSIS — I10 Essential (primary) hypertension: Secondary | ICD-10-CM | POA: Diagnosis not present

## 2018-03-11 DIAGNOSIS — R531 Weakness: Secondary | ICD-10-CM | POA: Insufficient documentation

## 2018-03-11 DIAGNOSIS — Z87891 Personal history of nicotine dependence: Secondary | ICD-10-CM | POA: Diagnosis not present

## 2018-03-11 DIAGNOSIS — R51 Headache: Secondary | ICD-10-CM | POA: Diagnosis not present

## 2018-03-11 DIAGNOSIS — D869 Sarcoidosis, unspecified: Secondary | ICD-10-CM | POA: Diagnosis not present

## 2018-03-11 DIAGNOSIS — H538 Other visual disturbances: Secondary | ICD-10-CM | POA: Insufficient documentation

## 2018-03-11 DIAGNOSIS — R739 Hyperglycemia, unspecified: Secondary | ICD-10-CM

## 2018-03-11 DIAGNOSIS — R631 Polydipsia: Secondary | ICD-10-CM | POA: Insufficient documentation

## 2018-03-11 HISTORY — DX: Type 2 diabetes mellitus without complications: E11.9

## 2018-03-11 HISTORY — DX: Sarcoidosis, unspecified: D86.9

## 2018-03-11 HISTORY — DX: Unspecified osteoarthritis, unspecified site: M19.90

## 2018-03-11 LAB — COMPREHENSIVE METABOLIC PANEL
ALT: 74 U/L — ABNORMAL HIGH (ref 17–63)
ANION GAP: 11 (ref 5–15)
AST: 81 U/L — ABNORMAL HIGH (ref 15–41)
Albumin: 3.7 g/dL (ref 3.5–5.0)
Alkaline Phosphatase: 104 U/L (ref 38–126)
BILIRUBIN TOTAL: 0.8 mg/dL (ref 0.3–1.2)
BUN: 13 mg/dL (ref 6–20)
CO2: 30 mmol/L (ref 22–32)
Calcium: 9.6 mg/dL (ref 8.9–10.3)
Chloride: 92 mmol/L — ABNORMAL LOW (ref 101–111)
Creatinine, Ser: 0.79 mg/dL (ref 0.61–1.24)
GFR calc Af Amer: >60 mL/min (ref >60)
GFR calc non Af Amer: >60 mL/min (ref >60)
GLUCOSE: 468 mg/dL — AB (ref 65–99)
POTASSIUM: 4.1 mmol/L (ref 3.5–5.1)
SODIUM: 133 mmol/L — AB (ref 135–145)
TOTAL PROTEIN: 6.9 g/dL (ref 6.5–8.1)

## 2018-03-11 LAB — BLOOD GAS, VENOUS
ACID-BASE EXCESS: 4.7 mmol/L — AB (ref 0.0–2.0)
Bicarbonate: 31.2 mmol/L — ABNORMAL HIGH (ref 20.0–28.0)
DRAWN BY: 270211
O2 Saturation: 17.9 %
PATIENT TEMPERATURE: 98.6
pCO2, Ven: 55.6 mmHg (ref 44.0–60.0)
pH, Ven: 7.368 (ref 7.250–7.430)

## 2018-03-11 LAB — URINALYSIS, ROUTINE W REFLEX MICROSCOPIC
BILIRUBIN URINE: NEGATIVE
Bacteria, UA: NONE SEEN
Glucose, UA: >=500 mg/dL — AB
Hgb urine dipstick: NEGATIVE
KETONES UR: NEGATIVE mg/dL
LEUKOCYTES UA: NEGATIVE
Nitrite: NEGATIVE
PH: 6 (ref 5.0–8.0)
Protein, ur: NEGATIVE mg/dL
RBC / HPF: NONE SEEN RBC/hpf (ref 0–5)
Specific Gravity, Urine: 1.03 (ref 1.005–1.030)

## 2018-03-11 LAB — CBG MONITORING, ED
Glucose-Capillary: 393 mg/dL — ABNORMAL HIGH (ref 65–99)
Glucose-Capillary: 333 mg/dL — ABNORMAL HIGH (ref 65–99)
Glucose-Capillary: 253 mg/dL — ABNORMAL HIGH (ref 65–99)

## 2018-03-11 LAB — CBC WITH DIFFERENTIAL/PLATELET
BASOS PCT: 0 %
Basophils Absolute: 0 10*3/uL (ref 0.0–0.1)
EOS ABS: 0 10*3/uL (ref 0.0–0.7)
EOS PCT: 0 %
HEMATOCRIT: 49.9 % (ref 39.0–52.0)
Hemoglobin: 17.2 g/dL — ABNORMAL HIGH (ref 13.0–17.0)
Lymphocytes Relative: 7 %
Lymphs Abs: 0.8 10*3/uL (ref 0.7–4.0)
MCH: 26.5 pg (ref 26.0–34.0)
MCHC: 34.5 g/dL (ref 30.0–36.0)
MCV: 77 fL — ABNORMAL LOW (ref 78.0–100.0)
MONO ABS: 0.1 10*3/uL (ref 0.1–1.0)
MONOS PCT: 1 %
Neutro Abs: 10.3 10*3/uL — ABNORMAL HIGH (ref 1.7–7.7)
Neutrophils Relative %: 92 %
PLATELETS: 96 10*3/uL — AB (ref 150–400)
RBC: 6.48 MIL/uL — ABNORMAL HIGH (ref 4.22–5.81)
RDW: 18.1 % — AB (ref 11.5–15.5)
WBC: 11.2 10*3/uL — ABNORMAL HIGH (ref 4.0–10.5)

## 2018-03-11 MED ORDER — SODIUM CHLORIDE 0.9 % IV BOLUS
1000.0000 mL | Freq: Once | INTRAVENOUS | Status: AC
  Administered 2018-03-11: 1000 mL via INTRAVENOUS

## 2018-03-11 MED ORDER — INSULIN ASPART 100 UNIT/ML IV SOLN: 10.0000 [IU] | Freq: Once | INTRAVENOUS | Status: DC

## 2018-03-11 MED ORDER — INSULIN ASPART 100 UNIT/ML IV SOLN
10.0000 [IU] | Freq: Once | INTRAVENOUS | Status: AC
  Administered 2018-03-11: 10 [IU] via INTRAVENOUS
  Filled 2018-03-11: qty 0.1

## 2018-03-11 MED ORDER — INSULIN ASPART 100 UNIT/ML ~~LOC~~ SOLN
10.0000 [IU] | Freq: Once | SUBCUTANEOUS | Status: DC
  Filled 2018-03-11: qty 1

## 2018-03-11 MED ORDER — SODIUM CHLORIDE 0.9 % IV BOLUS
1000.0000 mL | Freq: Once | INTRAVENOUS | Status: AC
Start: 2018-03-11 — End: 2018-03-11
  Administered 2018-03-11: 1000 mL via INTRAVENOUS

## 2018-03-11 NOTE — ED Provider Notes (Addendum)
[facility] DEPT Provider Note   CSN: [id number] Arrival date & time: 03/11/18  1043     History   Chief Complaint Chief Complaint  Patient presents with  . Hyperglycemia  . Headache    HPI Jeffrey Hayes is a 67 y.o. male.  Patient is a 67 year old male with a history of borderline diabetes for years, hypertension and sarcoidosis who recently saw a rheumatologist and was started on prednisone last week.  He started to develop generalized weakness, increased thirst and polydipsia.  He states he is having headache and some blurry vision.  He just generally feels ill and not himself.  He also notes he has lost about 10 pounds this week.  He saw his PCP yesterday and had a blood sugar of 550 and was started on metformin 2000 mg daily and took the first dose last night.  However he was not feeling any better this morning so came for further evaluation.  The history is provided by the patient.  Hyperglycemia  Blood sugar level PTA:  550 Severity:  Severe Onset quality:  Gradual Duration:  1 week Timing:  Constant Progression:  Worsening Chronicity:  New Current diabetic treatments: borderline for years. Current diabetic therapy:  Started metformin yesterday Time since last antidiabetic medication: this morning. Context: change in medication   Associated symptoms: blurred vision, dehydration, fatigue, increased thirst, weakness and weight change   Associated symptoms: no altered mental status, no chest pain, no confusion, no dizziness, no dysuria, no fever, no nausea and no shortness of breath   Risk factors: recent steroid use   Headache   Pertinent negatives include no fever, no shortness of breath and no nausea.    Past Medical History:  Diagnosis Date  . Arthritis   . Diabetes mellitus without complication ([facility])   . Hypertension   . Sarcoidosis     Patient Active Problem List   Diagnosis Date Noted  . Nodule of right lung 04/15/2017  .  Stopped smoking with greater than 30 pack year history 04/15/2017    Past Surgical History:  Procedure Laterality Date  . CHALAZION EXCISION Left 03/07/2013   Procedure: EXCISION OF CYST LOWER LEFT EYELID;  Surgeon: Myrtha Mantis., MD;  Location: [facility];  Service: Ophthalmology;  Laterality: Left;  . IR PARACENTESIS  01/12/2018  . IR PARACENTESIS  01/20/2018  . IR PARACENTESIS  02/10/2018  . ROTATOR CUFF REPAIR    . SHOULDER SURGERY Left    per pt - had a pin placed.         Home Medications    Prior to Admission medications   Medication Sig Start Date End Date Taking? Authorizing Provider  amLODipine (NORVASC) 10 MG tablet Take 10 mg by mouth daily.    [provider]  atorvastatin (LIPITOR) 20 MG tablet Take 20 mg by mouth every evening.     [provider]  diphenhydrAMINE (BENADRYL) 25 MG tablet Take 25 mg by mouth daily as needed for allergies (EQUATE BRAND).    [provider]  losartan (COZAAR) 25 MG tablet Take 25 mg by mouth daily.    [provider]  omeprazole (PRILOSEC) 40 MG capsule Take 40 mg by mouth daily.    [provider]    Family History Family History  Problem Relation Age of Onset  . Lung disease Father   . Lung disease Brother   . Alcohol abuse Brother   . Drug abuse Brother  Social History Social History   Tobacco Use  . Smoking status: Former Smoker    Packs/day: 0.75    Years: 50.00    Pack years: 37.50    Types: Cigarettes    Last attempt to quit: 12/06/2016    Years since quitting: 1.2  . Smokeless tobacco: Never Used  Substance Use Topics  . Alcohol use: Not Currently  . Drug use: No     Allergies   Patient has no known allergies.   Review of Systems Review of Systems  Constitutional: Positive for fatigue. Negative for fever.  Eyes: Positive for blurred vision.  Respiratory: Negative for shortness of breath.   Cardiovascular: Negative for chest pain.    Gastrointestinal: Negative for nausea.  Endocrine: Positive for polydipsia.  Genitourinary: Negative for dysuria.  Neurological: Positive for weakness and headaches. Negative for dizziness.  Psychiatric/Behavioral: Negative for confusion.  All other systems reviewed and are negative.    Physical Exam Updated Vital Signs BP (!) 145/91 (BP Location: Left Arm)   Pulse 70   Resp 18   Ht 6\' 2"  (1.88 m)   Wt 75.3 kg (166 lb)   SpO2 98%   BMI 21.31 kg/m   Physical Exam  Constitutional: He is oriented to person, place, and time. He appears well-developed and well-nourished. No distress.  HENT:  Head: Normocephalic and atraumatic.  Mouth/Throat: Oropharynx is clear and moist. Mucous membranes are dry.  Eyes: Pupils are equal, round, and reactive to light. Conjunctivae and EOM are normal.  Neck: Normal range of motion. Neck supple.  Cardiovascular: Normal rate, regular rhythm and intact distal pulses.  No murmur heard. Pulmonary/Chest: Effort normal and breath sounds normal. No respiratory distress. He has no wheezes. He has no rales.  Abdominal: Soft. He exhibits no distension. There is no tenderness. There is no rebound and no guarding.  Musculoskeletal: Normal range of motion. He exhibits no edema or tenderness.  Neurological: He is alert and oriented to person, place, and time.  Skin: Skin is warm and dry. No rash noted. No erythema.  Psychiatric: He has a normal mood and affect. His behavior is normal.  Nursing note and vitals reviewed.    ED Treatments / Results  Labs (all labs ordered are listed, but only abnormal results are displayed) Labs Reviewed  CBC WITH DIFFERENTIAL/PLATELET - Abnormal; Notable for the following components:      Result Value   WBC 11.2 (*)    RBC 6.48 (*)    Hemoglobin 17.2 (*)    MCV 77.0 (*)    RDW 18.1 (*)    Platelets 96 (*)    Neutro Abs 10.3 (*)    All other components within normal limits  COMPREHENSIVE METABOLIC PANEL - Abnormal;  Notable for the following components:   Sodium 133 (*)    Chloride 92 (*)    Glucose, Bld 468 (*)    AST 81 (*)    ALT 74 (*)    All other components within normal limits  URINALYSIS, ROUTINE W REFLEX MICROSCOPIC - Abnormal; Notable for the following components:   Color, Urine STRAW (*)    Glucose, UA >=500 (*)    Squamous Epithelial / LPF 0-5 (*)    All other components within normal limits  BLOOD GAS, VENOUS - Abnormal; Notable for the following components:   Bicarbonate 31.2 (*)    Acid-Base Excess 4.7 (*)    All other components within normal limits  CBG MONITORING, ED - Abnormal; Notable for the following  components:   Glucose-Capillary 393 (*)    All other components within normal limits  CBG MONITORING, ED - Abnormal; Notable for the following components:   Glucose-Capillary 333 (*)    All other components within normal limits  CBG MONITORING, ED - Abnormal; Notable for the following components:   Glucose-Capillary 253 (*)    All other components within normal limits    EKG None  Radiology No results found.  Procedures Procedures (including critical care time)  Medications Ordered in ED Medications  sodium chloride 0.9 % bolus 1,000 mL (1,000 mLs Intravenous New Bag/Given 03/11/18 1121)     Initial Impression / Assessment and Plan / ED Course  I have reviewed the triage vital signs and the nursing notes.  Pertinent labs & imaging results that were available during my care of the patient were reviewed by me and considered in my medical decision making (see chart for details).     Patient presenting with symptoms of hyperglycemia with polydipsia, polyuria, general fatigue and weight loss.  This all started after starting prednisone for sarcoidosis.  This is most likely what has caused his blood sugar to be elevated.  He started metformin yesterday but was not feeling better.  Blood sugar today is improved from when he was in the office yesterday but is still 393.   Patient is not complaining of symptoms concerning for DKA.  He has not had any significant abdominal pain, nausea or vomiting.  He does not smell of ketones and his vital signs are reassuring.  Will give IV fluids to improve patient's blood sugar.  He plans on calling his rheumatologist tomorrow to see if he has to be on the steroids and can be changed to something else.  CBC, CMP, VBG, UA pending  12:09 PM Labs with hemoconcentration with a hemoglobin of 17 but VBG without signs of DKA and anion gap of 11.  Mild elevated LFTs but this may be due to his sarcoidosis.  Will check blood sugar after 2 L of fluid.   1:45 PM Repeat sugar is 390.  Will give IV insulin.  And recheck in 30-60min.  3:16 PM Repeat blood sugar is 250 and patient is feeling significantly better.  Will discharge home to continue metformin follow-up with his PCP and rheumatologist.  Final Clinical Impressions(s) / ED Diagnoses   Final diagnoses:  Hyperglycemia  Dehydration    ED Discharge Orders    None       Blanchie Dessert, MD 03/11/18 1516    Blanchie Dessert, MD 03/11/18 1517

## 2018-03-11 NOTE — ED Triage Notes (Addendum)
Patient states he has "not been feeling well" x 3 days. Patient states he has had a headache and blurred vision. Patient went to PCP yesterday and CBG-550. Patient hs been on steroids for Sarcoidosis.

## 2018-03-16 DIAGNOSIS — R0609 Other forms of dyspnea: Secondary | ICD-10-CM | POA: Diagnosis not present

## 2018-03-16 DIAGNOSIS — R188 Other ascites: Secondary | ICD-10-CM | POA: Diagnosis not present

## 2018-03-16 DIAGNOSIS — D869 Sarcoidosis, unspecified: Secondary | ICD-10-CM | POA: Diagnosis not present

## 2018-03-16 DIAGNOSIS — Z6822 Body mass index (BMI) 22.0-22.9, adult: Secondary | ICD-10-CM | POA: Diagnosis not present

## 2018-04-12 DIAGNOSIS — F17290 Nicotine dependence, other tobacco product, uncomplicated: Secondary | ICD-10-CM | POA: Diagnosis not present

## 2018-04-12 DIAGNOSIS — K219 Gastro-esophageal reflux disease without esophagitis: Secondary | ICD-10-CM | POA: Diagnosis not present

## 2018-04-12 DIAGNOSIS — E119 Type 2 diabetes mellitus without complications: Secondary | ICD-10-CM | POA: Diagnosis not present

## 2018-04-12 DIAGNOSIS — M1990 Unspecified osteoarthritis, unspecified site: Secondary | ICD-10-CM | POA: Diagnosis not present

## 2018-04-12 DIAGNOSIS — R634 Abnormal weight loss: Secondary | ICD-10-CM | POA: Diagnosis not present

## 2018-04-12 DIAGNOSIS — I10 Essential (primary) hypertension: Secondary | ICD-10-CM | POA: Diagnosis not present

## 2018-04-12 DIAGNOSIS — E785 Hyperlipidemia, unspecified: Secondary | ICD-10-CM | POA: Diagnosis not present

## 2018-04-13 DIAGNOSIS — E119 Type 2 diabetes mellitus without complications: Secondary | ICD-10-CM | POA: Diagnosis not present

## 2018-04-13 DIAGNOSIS — I10 Essential (primary) hypertension: Secondary | ICD-10-CM | POA: Diagnosis not present

## 2018-04-13 DIAGNOSIS — E785 Hyperlipidemia, unspecified: Secondary | ICD-10-CM | POA: Diagnosis not present

## 2018-04-18 DIAGNOSIS — R634 Abnormal weight loss: Secondary | ICD-10-CM | POA: Diagnosis not present

## 2018-04-18 DIAGNOSIS — K5730 Diverticulosis of large intestine without perforation or abscess without bleeding: Secondary | ICD-10-CM | POA: Diagnosis not present

## 2018-04-18 DIAGNOSIS — K2930 Chronic superficial gastritis without bleeding: Secondary | ICD-10-CM | POA: Diagnosis not present

## 2018-04-18 DIAGNOSIS — K219 Gastro-esophageal reflux disease without esophagitis: Secondary | ICD-10-CM | POA: Diagnosis not present

## 2018-04-18 DIAGNOSIS — K635 Polyp of colon: Secondary | ICD-10-CM | POA: Diagnosis not present

## 2018-04-18 DIAGNOSIS — K2900 Acute gastritis without bleeding: Secondary | ICD-10-CM | POA: Diagnosis not present

## 2018-04-18 DIAGNOSIS — D126 Benign neoplasm of colon, unspecified: Secondary | ICD-10-CM | POA: Diagnosis not present

## 2018-04-18 DIAGNOSIS — K3189 Other diseases of stomach and duodenum: Secondary | ICD-10-CM | POA: Diagnosis not present

## 2018-04-18 DIAGNOSIS — K640 First degree hemorrhoids: Secondary | ICD-10-CM | POA: Diagnosis not present

## 2018-04-24 DIAGNOSIS — K635 Polyp of colon: Secondary | ICD-10-CM | POA: Diagnosis not present

## 2018-04-24 DIAGNOSIS — D126 Benign neoplasm of colon, unspecified: Secondary | ICD-10-CM | POA: Diagnosis not present

## 2018-05-02 ENCOUNTER — Inpatient Hospital Stay (HOSPITAL_COMMUNITY)
Admission: EM | Admit: 2018-05-02 | Discharge: 2018-05-15 | DRG: 871 | Disposition: A | Discharge status: Home / Self Care | Payer: Medicare Other | Attending: Internal Medicine | Admitting: Internal Medicine

## 2018-05-02 ENCOUNTER — Emergency Department (HOSPITAL_COMMUNITY): Payer: Medicare Other

## 2018-05-02 ENCOUNTER — Encounter (HOSPITAL_COMMUNITY): Payer: Self-pay | Admitting: Emergency Medicine

## 2018-05-02 DIAGNOSIS — D8689 Sarcoidosis of other sites: Secondary | ICD-10-CM | POA: Diagnosis present

## 2018-05-02 DIAGNOSIS — E876 Hypokalemia: Secondary | ICD-10-CM | POA: Diagnosis present

## 2018-05-02 DIAGNOSIS — R680 Hypothermia, not associated with low environmental temperature: Secondary | ICD-10-CM | POA: Diagnosis not present

## 2018-05-02 DIAGNOSIS — J189 Pneumonia, unspecified organism: Secondary | ICD-10-CM

## 2018-05-02 DIAGNOSIS — J158 Pneumonia due to other specified bacteria: Secondary | ICD-10-CM | POA: Diagnosis not present

## 2018-05-02 DIAGNOSIS — D7589 Other specified diseases of blood and blood-forming organs: Secondary | ICD-10-CM | POA: Diagnosis not present

## 2018-05-02 DIAGNOSIS — I10 Essential (primary) hypertension: Secondary | ICD-10-CM | POA: Diagnosis not present

## 2018-05-02 DIAGNOSIS — R6520 Severe sepsis without septic shock: Secondary | ICD-10-CM | POA: Diagnosis not present

## 2018-05-02 DIAGNOSIS — A159 Respiratory tuberculosis unspecified: Secondary | ICD-10-CM | POA: Diagnosis not present

## 2018-05-02 DIAGNOSIS — A150 Tuberculosis of lung: Secondary | ICD-10-CM | POA: Diagnosis present

## 2018-05-02 DIAGNOSIS — J188 Other pneumonia, unspecified organism: Secondary | ICD-10-CM | POA: Diagnosis present

## 2018-05-02 DIAGNOSIS — I4892 Unspecified atrial flutter: Secondary | ICD-10-CM | POA: Diagnosis not present

## 2018-05-02 DIAGNOSIS — E162 Hypoglycemia, unspecified: Secondary | ICD-10-CM | POA: Diagnosis not present

## 2018-05-02 DIAGNOSIS — R918 Other nonspecific abnormal finding of lung field: Secondary | ICD-10-CM | POA: Diagnosis not present

## 2018-05-02 DIAGNOSIS — R531 Weakness: Secondary | ICD-10-CM

## 2018-05-02 DIAGNOSIS — J9690 Respiratory failure, unspecified, unspecified whether with hypoxia or hypercapnia: Secondary | ICD-10-CM

## 2018-05-02 DIAGNOSIS — Z681 Body mass index (BMI) 19 or less, adult: Secondary | ICD-10-CM

## 2018-05-02 DIAGNOSIS — J9601 Acute respiratory failure with hypoxia: Secondary | ICD-10-CM | POA: Diagnosis not present

## 2018-05-02 DIAGNOSIS — R509 Fever, unspecified: Secondary | ICD-10-CM | POA: Diagnosis not present

## 2018-05-02 DIAGNOSIS — K7031 Alcoholic cirrhosis of liver with ascites: Secondary | ICD-10-CM | POA: Diagnosis present

## 2018-05-02 DIAGNOSIS — R066 Hiccough: Secondary | ICD-10-CM | POA: Diagnosis not present

## 2018-05-02 DIAGNOSIS — E861 Hypovolemia: Secondary | ICD-10-CM | POA: Diagnosis present

## 2018-05-02 DIAGNOSIS — M1990 Unspecified osteoarthritis, unspecified site: Secondary | ICD-10-CM | POA: Diagnosis not present

## 2018-05-02 DIAGNOSIS — D696 Thrombocytopenia, unspecified: Secondary | ICD-10-CM | POA: Diagnosis present

## 2018-05-02 DIAGNOSIS — T433X5A Adverse effect of phenothiazine antipsychotics and neuroleptics, initial encounter: Secondary | ICD-10-CM | POA: Diagnosis not present

## 2018-05-02 DIAGNOSIS — E11649 Type 2 diabetes mellitus with hypoglycemia without coma: Secondary | ICD-10-CM | POA: Diagnosis not present

## 2018-05-02 DIAGNOSIS — R64 Cachexia: Secondary | ICD-10-CM | POA: Diagnosis present

## 2018-05-02 DIAGNOSIS — E871 Hypo-osmolality and hyponatremia: Secondary | ICD-10-CM | POA: Diagnosis not present

## 2018-05-02 DIAGNOSIS — R0902 Hypoxemia: Secondary | ICD-10-CM | POA: Diagnosis not present

## 2018-05-02 DIAGNOSIS — E119 Type 2 diabetes mellitus without complications: Secondary | ICD-10-CM | POA: Diagnosis not present

## 2018-05-02 DIAGNOSIS — E1165 Type 2 diabetes mellitus with hyperglycemia: Secondary | ICD-10-CM | POA: Diagnosis present

## 2018-05-02 DIAGNOSIS — Z801 Family history of malignant neoplasm of trachea, bronchus and lung: Secondary | ICD-10-CM

## 2018-05-02 DIAGNOSIS — R0602 Shortness of breath: Secondary | ICD-10-CM | POA: Diagnosis not present

## 2018-05-02 DIAGNOSIS — Z87891 Personal history of nicotine dependence: Secondary | ICD-10-CM | POA: Diagnosis not present

## 2018-05-02 DIAGNOSIS — J984 Other disorders of lung: Secondary | ICD-10-CM | POA: Diagnosis not present

## 2018-05-02 DIAGNOSIS — E43 Unspecified severe protein-calorie malnutrition: Secondary | ICD-10-CM | POA: Diagnosis not present

## 2018-05-02 DIAGNOSIS — F17290 Nicotine dependence, other tobacco product, uncomplicated: Secondary | ICD-10-CM | POA: Diagnosis not present

## 2018-05-02 DIAGNOSIS — Y95 Nosocomial condition: Secondary | ICD-10-CM | POA: Diagnosis present

## 2018-05-02 DIAGNOSIS — Z7952 Long term (current) use of systemic steroids: Secondary | ICD-10-CM

## 2018-05-02 DIAGNOSIS — R740 Nonspecific elevation of levels of transaminase and lactic acid dehydrogenase [LDH]: Secondary | ICD-10-CM | POA: Diagnosis not present

## 2018-05-02 DIAGNOSIS — R945 Abnormal results of liver function studies: Secondary | ICD-10-CM | POA: Diagnosis present

## 2018-05-02 DIAGNOSIS — D869 Sarcoidosis, unspecified: Secondary | ICD-10-CM | POA: Diagnosis not present

## 2018-05-02 DIAGNOSIS — Z7984 Long term (current) use of oral hypoglycemic drugs: Secondary | ICD-10-CM

## 2018-05-02 DIAGNOSIS — R630 Anorexia: Secondary | ICD-10-CM | POA: Diagnosis not present

## 2018-05-02 DIAGNOSIS — K7460 Unspecified cirrhosis of liver: Secondary | ICD-10-CM | POA: Diagnosis not present

## 2018-05-02 DIAGNOSIS — D649 Anemia, unspecified: Secondary | ICD-10-CM | POA: Diagnosis not present

## 2018-05-02 DIAGNOSIS — R911 Solitary pulmonary nodule: Secondary | ICD-10-CM | POA: Diagnosis present

## 2018-05-02 DIAGNOSIS — D509 Iron deficiency anemia, unspecified: Secondary | ICD-10-CM | POA: Diagnosis not present

## 2018-05-02 DIAGNOSIS — R188 Other ascites: Secondary | ICD-10-CM | POA: Diagnosis not present

## 2018-05-02 DIAGNOSIS — R7989 Other specified abnormal findings of blood chemistry: Secondary | ICD-10-CM | POA: Diagnosis present

## 2018-05-02 DIAGNOSIS — A419 Sepsis, unspecified organism: Secondary | ICD-10-CM | POA: Diagnosis not present

## 2018-05-02 DIAGNOSIS — R634 Abnormal weight loss: Secondary | ICD-10-CM | POA: Diagnosis not present

## 2018-05-02 DIAGNOSIS — E222 Syndrome of inappropriate secretion of antidiuretic hormone: Secondary | ICD-10-CM | POA: Diagnosis present

## 2018-05-02 DIAGNOSIS — Z5181 Encounter for therapeutic drug level monitoring: Secondary | ICD-10-CM | POA: Diagnosis not present

## 2018-05-02 DIAGNOSIS — Z6821 Body mass index (BMI) 21.0-21.9, adult: Secondary | ICD-10-CM | POA: Diagnosis not present

## 2018-05-02 DIAGNOSIS — G9341 Metabolic encephalopathy: Secondary | ICD-10-CM | POA: Diagnosis not present

## 2018-05-02 DIAGNOSIS — E785 Hyperlipidemia, unspecified: Secondary | ICD-10-CM | POA: Diagnosis not present

## 2018-05-02 DIAGNOSIS — R748 Abnormal levels of other serum enzymes: Secondary | ICD-10-CM | POA: Diagnosis not present

## 2018-05-02 DIAGNOSIS — Z79899 Other long term (current) drug therapy: Secondary | ICD-10-CM

## 2018-05-02 LAB — BASIC METABOLIC PANEL
Anion gap: 12 (ref 5–15)
BUN: 11 mg/dL (ref 6–20)
CALCIUM: 8.8 mg/dL — AB (ref 8.9–10.3)
CO2: 26 mmol/L (ref 22–32)
CREATININE: 0.99 mg/dL (ref 0.61–1.24)
Chloride: 88 mmol/L — ABNORMAL LOW (ref 101–111)
GFR calc Af Amer: >60 mL/min (ref >60)
GFR calc non Af Amer: >60 mL/min (ref >60)
Glucose, Bld: 166 mg/dL — ABNORMAL HIGH (ref 65–99)
POTASSIUM: 4 mmol/L (ref 3.5–5.1)
Sodium: 126 mmol/L — ABNORMAL LOW (ref 135–145)

## 2018-05-02 LAB — CBC
HCT: 43.4 % (ref 39.0–52.0)
HEMOGLOBIN: 14.3 g/dL (ref 13.0–17.0)
MCH: 25.3 pg — AB (ref 26.0–34.0)
MCHC: 32.9 g/dL (ref 30.0–36.0)
MCV: 76.8 fL — AB (ref 78.0–100.0)
PLATELETS: 92 10*3/uL — AB (ref 150–400)
RBC: 5.65 MIL/uL (ref 4.22–5.81)
RDW: 18.6 % — ABNORMAL HIGH (ref 11.5–15.5)
WBC: 8 10*3/uL (ref 4.0–10.5)

## 2018-05-02 LAB — I-STAT CG4 LACTIC ACID, ED
LACTIC ACID, VENOUS: 2.27 mmol/L — AB (ref 0.5–1.9)
LACTIC ACID, VENOUS: 2.04 mmol/L — AB (ref 0.5–1.9)

## 2018-05-02 LAB — URINALYSIS, ROUTINE W REFLEX MICROSCOPIC
BILIRUBIN URINE: NEGATIVE
Glucose, UA: NEGATIVE mg/dL
HGB URINE DIPSTICK: NEGATIVE
KETONES UR: NEGATIVE mg/dL
LEUKOCYTES UA: NEGATIVE
Nitrite: NEGATIVE
PH: 5 (ref 5.0–8.0)
Protein, ur: 30 mg/dL — AB
SPECIFIC GRAVITY, URINE: 1.02 (ref 1.005–1.030)

## 2018-05-02 LAB — HEPATIC FUNCTION PANEL
ALT: 90 U/L — AB (ref 17–63)
AST: 129 U/L — ABNORMAL HIGH (ref 15–41)
Albumin: 2.2 g/dL — ABNORMAL LOW (ref 3.5–5.0)
Alkaline Phosphatase: 158 U/L — ABNORMAL HIGH (ref 38–126)
BILIRUBIN DIRECT: 0.6 mg/dL — AB (ref 0.1–0.5)
BILIRUBIN INDIRECT: 0.8 mg/dL (ref 0.3–0.9)
Total Bilirubin: 1.4 mg/dL — ABNORMAL HIGH (ref 0.3–1.2)
Total Protein: 5.7 g/dL — ABNORMAL LOW (ref 6.5–8.1)

## 2018-05-02 LAB — CBG MONITORING, ED
Glucose-Capillary: 136 mg/dL — ABNORMAL HIGH (ref 65–99)
Glucose-Capillary: 144 mg/dL — ABNORMAL HIGH (ref 65–99)

## 2018-05-02 LAB — T4, FREE: Free T4: 0.91 ng/dL (ref 0.82–1.77)

## 2018-05-02 LAB — D-DIMER, QUANTITATIVE (NOT AT ARMC): D DIMER QUANT: 3.59 ug{FEU}/mL — AB (ref 0.00–0.50)

## 2018-05-02 LAB — I-STAT TROPONIN, ED: TROPONIN I, POC: 0 ng/mL (ref 0.00–0.08)

## 2018-05-02 LAB — TSH: TSH: 2.416 u[IU]/mL (ref 0.350–4.500)

## 2018-05-02 MED ORDER — IOPAMIDOL (ISOVUE-370) INJECTION 76%
INTRAVENOUS | Status: AC
  Filled 2018-05-02: qty 100

## 2018-05-02 MED ORDER — LACTATED RINGERS IV BOLUS
1000.0000 mL | Freq: Once | INTRAVENOUS | Status: AC
  Administered 2018-05-02: 1000 mL via INTRAVENOUS

## 2018-05-02 MED ORDER — INSULIN ASPART 100 UNIT/ML ~~LOC~~ SOLN: 0.0000 [IU] | Freq: Every day | SUBCUTANEOUS | Status: DC

## 2018-05-02 MED ORDER — ONDANSETRON HCL 4 MG PO TABS
4.0000 mg | ORAL_TABLET | Freq: Four times a day (QID) | ORAL | Status: DC | PRN
  Administered 2018-05-14: 4 mg via ORAL
  Filled 2018-05-02: qty 1

## 2018-05-02 MED ORDER — SODIUM CHLORIDE 0.9 % IV BOLUS
500.0000 mL | Freq: Once | INTRAVENOUS | Status: AC
  Administered 2018-05-02: 500 mL via INTRAVENOUS

## 2018-05-02 MED ORDER — ENOXAPARIN SODIUM 40 MG/0.4ML ~~LOC~~ SOLN: 40.0000 mg | SUBCUTANEOUS | Status: DC

## 2018-05-02 MED ORDER — ACETAMINOPHEN 325 MG PO TABS
650.0000 mg | ORAL_TABLET | Freq: Four times a day (QID) | ORAL | Status: DC | PRN
  Administered 2018-05-02 – 2018-05-03 (×2): 650 mg via ORAL
  Filled 2018-05-02 (×3): qty 2

## 2018-05-02 MED ORDER — METOPROLOL SUCCINATE ER 50 MG PO TB24
50.0000 mg | ORAL_TABLET | Freq: Every day | ORAL | Status: DC
  Administered 2018-05-03 – 2018-05-06 (×4): 50 mg via ORAL
  Filled 2018-05-02 (×4): qty 1

## 2018-05-02 MED ORDER — INSULIN ASPART 100 UNIT/ML ~~LOC~~ SOLN
0.0000 [IU] | Freq: Three times a day (TID) | SUBCUTANEOUS | Status: DC
  Administered 2018-05-03: 3 [IU] via SUBCUTANEOUS
  Administered 2018-05-07 – 2018-05-08 (×2): 1 [IU] via SUBCUTANEOUS
  Filled 2018-05-02 (×2): qty 1

## 2018-05-02 MED ORDER — PIPERACILLIN-TAZOBACTAM 3.375 G IVPB 30 MIN
3.3750 g | Freq: Once | INTRAVENOUS | Status: AC
  Administered 2018-05-02: 3.375 g via INTRAVENOUS
  Filled 2018-05-02: qty 50

## 2018-05-02 MED ORDER — HYDROCODONE-ACETAMINOPHEN 5-325 MG PO TABS
1.0000 | ORAL_TABLET | ORAL | Status: DC | PRN
  Filled 2018-05-02: qty 1

## 2018-05-02 MED ORDER — VANCOMYCIN HCL 10 G IV SOLR
1500.0000 mg | Freq: Once | INTRAVENOUS | Status: AC
  Administered 2018-05-02: 1500 mg via INTRAVENOUS
  Filled 2018-05-02: qty 1500

## 2018-05-02 MED ORDER — BISACODYL 5 MG PO TBEC: 5.0000 mg | DELAYED_RELEASE_TABLET | Freq: Every day | ORAL | Status: DC | PRN

## 2018-05-02 MED ORDER — ACETAMINOPHEN 650 MG RE SUPP: 650.0000 mg | Freq: Four times a day (QID) | RECTAL | Status: DC | PRN

## 2018-05-02 MED ORDER — AZITHROMYCIN 250 MG PO TABS
500.0000 mg | ORAL_TABLET | Freq: Once | ORAL | Status: AC
Start: 2018-05-02 — End: 2018-05-02
  Administered 2018-05-02: 500 mg via ORAL
  Filled 2018-05-02: qty 2

## 2018-05-02 MED ORDER — ONDANSETRON HCL 4 MG/2ML IJ SOLN: 4.0000 mg | Freq: Four times a day (QID) | INTRAMUSCULAR | Status: DC | PRN

## 2018-05-02 MED ORDER — VANCOMYCIN HCL IN DEXTROSE 1-5 GM/200ML-% IV SOLN
1000.0000 mg | Freq: Two times a day (BID) | INTRAVENOUS | Status: DC
Start: 2018-05-03 — End: 2018-05-07
  Administered 2018-05-03 – 2018-05-06 (×8): 1000 mg via INTRAVENOUS
  Filled 2018-05-02 (×9): qty 200

## 2018-05-02 MED ORDER — SODIUM CHLORIDE 0.9 % IV SOLN: INTRAVENOUS | Status: AC

## 2018-05-02 MED ORDER — SENNOSIDES-DOCUSATE SODIUM 8.6-50 MG PO TABS: 1.0000 | ORAL_TABLET | Freq: Every evening | ORAL | Status: DC | PRN

## 2018-05-02 MED ORDER — ATORVASTATIN CALCIUM 20 MG PO TABS
20.0000 mg | ORAL_TABLET | Freq: Every day | ORAL | Status: DC
  Administered 2018-05-03: 20 mg via ORAL
  Filled 2018-05-02: qty 1

## 2018-05-02 MED ORDER — SODIUM CHLORIDE 0.9 % IV SOLN
INTRAVENOUS | Status: DC
  Administered 2018-05-02: 20:00:00 via INTRAVENOUS

## 2018-05-02 MED ORDER — SODIUM CHLORIDE 0.9% FLUSH
3.0000 mL | Freq: Two times a day (BID) | INTRAVENOUS | Status: DC
  Administered 2018-05-03 – 2018-05-14 (×12): 3 mL via INTRAVENOUS

## 2018-05-02 MED ORDER — CEFTRIAXONE SODIUM 1 G IJ SOLR
1.0000 g | Freq: Once | INTRAMUSCULAR | Status: DC
  Administered 2018-05-02: 1 g via INTRAVENOUS
  Filled 2018-05-02: qty 10

## 2018-05-02 MED ORDER — IOPAMIDOL (ISOVUE-370) INJECTION 76%
100.0000 mL | Freq: Once | INTRAVENOUS | Status: AC | PRN
  Administered 2018-05-02: 100 mL via INTRAVENOUS

## 2018-05-02 MED ORDER — PIPERACILLIN-TAZOBACTAM 3.375 G IVPB
3.3750 g | Freq: Three times a day (TID) | INTRAVENOUS | Status: DC
  Administered 2018-05-03 – 2018-05-08 (×16): 3.375 g via INTRAVENOUS
  Filled 2018-05-02 (×20): qty 50

## 2018-05-02 NOTE — ED Provider Notes (Signed)
[facility] EMERGENCY DEPARTMENT Provider Note   CSN: [id number] Arrival date & time: 05/02/18  1034     History   Chief Complaint Chief Complaint  Patient presents with  . Weakness    HPI Jeffrey Hayes is a 67 y.o. male.  67yo M w/ PMH including T2DM, HTN, sarcoidosis who p/w weakness and decreased appetite. For the past 3 weeks he has had decreased appetite and generalized weakness. He reports some SOB on exertion. He denies any fever, N/V or urinary sx. He has had mild cough related to allergies. No chest pain. Of note, he recently had problems with abdominal fluid and his rheumatologist started prednisone. He was on 40mg  daily for 1 month, then reduced to 35mg  daily. He was having hyperglycemia so his PCP put him on 2000mg  metformin daily. About 10 days ago he was started on a second medication for his hyperglycemia. He stopped taking the daily prednisone ~2 weeks ago because he wasn't feeling well. No leg swelling or pain. No tick bites or outdoor exposure.   Weakness     Past Medical History:  Diagnosis Date  . Arthritis   . Diabetes mellitus without complication ([facility])   . Hypertension   . Sarcoidosis     Patient Active Problem List   Diagnosis Date Noted  . Nodule of right lung 04/15/2017  . Stopped smoking with greater than 30 pack year history 04/15/2017    Past Surgical History:  Procedure Laterality Date  . CHALAZION EXCISION Left 03/07/2013   Procedure: EXCISION OF CYST LOWER LEFT EYELID;  Surgeon: Myrtha Mantis., MD;  Location: [facility];  Service: Ophthalmology;  Laterality: Left;  . IR PARACENTESIS  01/12/2018  . IR PARACENTESIS  01/20/2018  . IR PARACENTESIS  02/10/2018  . ROTATOR CUFF REPAIR    . SHOULDER SURGERY Left    per pt - had a pin placed.         Home Medications    Prior to Admission medications   Medication Sig Start Date End Date Taking? Authorizing Provider  amLODipine (NORVASC) 10 MG tablet  Take 10 mg by mouth daily.   Yes [provider]  atorvastatin (LIPITOR) 20 MG tablet Take 20 mg by mouth every evening.    Yes [provider]  glimepiride (AMARYL) 4 MG tablet Take 4 mg by mouth daily. 04/17/18  Yes [provider]  losartan (COZAAR) 25 MG tablet Take 25 mg by mouth daily.   Yes [provider]  metFORMIN (GLUCOPHAGE) 1000 MG tablet Take 1,000 mg by mouth 2 (two) times daily with a meal.   Yes [provider]  metoprolol succinate (TOPROL-XL) 50 MG 24 hr tablet Take 50 mg by mouth daily. Take with or immediately following a meal.   Yes [provider]  predniSONE (DELTASONE) 10 MG tablet Take 10 mg by mouth daily with breakfast. 02/13/18  Yes [provider]    Family History Family History  Problem Relation Age of Onset  . Lung disease Father   . Lung disease Brother   . Alcohol abuse Brother   . Drug abuse Brother     Social History Social History   Tobacco Use  . Smoking status: Former Smoker    Packs/day: 0.75    Years: 50.00    Pack years: 37.50    Types: Cigarettes    Last attempt to quit: 12/06/2016    Years since quitting: 1.4  . Smokeless tobacco: Never Used  Substance Use Topics  . Alcohol use: Not Currently  . Drug use: No     Allergies   Patient has no known allergies.   Review of Systems Review of Systems  Neurological: Positive for weakness.  All other systems reviewed and are negative except that which was mentioned in HPI    Physical Exam Updated Vital Signs BP 109/73   Pulse (!) 103   Temp 100 F (37.8 C) (Oral)   Resp 20   SpO2 100%   Physical Exam  Constitutional: He is oriented to person, place, and time. He appears well-developed. No distress.  Frail man awake and comfortable  HENT:  Head: Normocephalic and atraumatic.  Dry mucous membranes  Eyes: Pupils are equal, round, and reactive to light. Conjunctivae are normal.  Neck: Neck supple.  Cardiovascular:  Normal rate and regular rhythm.  Murmur heard. Pulmonary/Chest: Effort normal and breath sounds normal.  Abdominal: Soft. Bowel sounds are normal. He exhibits no distension. There is no tenderness.  Musculoskeletal: He exhibits no edema.  Neurological: He is alert and oriented to person, place, and time.  Fluent speech  Skin: Skin is warm and dry.  Psychiatric: He has a normal mood and affect. Judgment normal.  Nursing note and vitals reviewed.    ED Treatments / Results  Labs (all labs ordered are listed, but only abnormal results are displayed) Labs Reviewed  BASIC METABOLIC PANEL - Abnormal; Notable for the following components:      Result Value   Sodium 126 (*)    Chloride 88 (*)    Glucose, Bld 166 (*)    Calcium 8.8 (*)    All other components within normal limits  CBC - Abnormal; Notable for the following components:   MCV 76.8 (*)    MCH 25.3 (*)    RDW 18.6 (*)    Platelets 92 (*)    All other components within normal limits  CBG MONITORING, ED - Abnormal; Notable for the following components:   Glucose-Capillary 136 (*)    All other components within normal limits  I-STAT CG4 LACTIC ACID, ED - Abnormal; Notable for the following components:   Lactic Acid, Venous 2.27 (*)    All other components within normal limits  URINALYSIS, ROUTINE W REFLEX MICROSCOPIC  TSH  T4, FREE  D-DIMER, QUANTITATIVE (NOT AT [facility])  HEPATIC FUNCTION PANEL  I-STAT TROPONIN, ED    EKG EKG Interpretation  Date/Time:  Tuesday May 02 2018 10:41:17 EDT Ventricular Rate:  132 PR Interval:  152 QRS Duration: 74 QT Interval:  296 QTC Calculation: 438 R Axis:   15 Text Interpretation:  Sinus tachycardia with occasional Premature ventricular complexes and Fusion complexes Otherwise normal ECG tachycardia new from previous Confirmed by Theotis Burrow [phone]) on 05/02/2018 2:26:24 PM   Radiology No results found.  Procedures Procedures (including critical care time)  Medications  Ordered in ED Medications  lactated ringers bolus 1,000 mL (has no administration in time range)     Initial Impression / Assessment and Plan / ED Course  I have reviewed the triage vital signs and the nursing notes.  Pertinent labs & imaging results that were available during my care of the patient were reviewed by me and considered in my medical decision making (see chart for details).    PT comfortable on exam, mildly tachycardic, repeat temp was 98. He had no abd tenderness or complaints of pain. DDx is broad and includes infectious process, mild adrenal insufficiency from stopping prednisone, or malignancy.  Labs thus far show Lactate 2.3, Na 126, Cl 88, glucose 166, normal creatinine, normal WBC count, PLT 92,000. LFTs, thyroid studies, UA, and d-dimer are pending along with CXR. Gave 1L IVF. I am signing out to Dr. Maryan Rued pending study results.   Final Clinical Impressions(s) / ED Diagnoses   Final diagnoses:  None    ED Discharge Orders    None       Tarri Guilfoil, Wenda Overland, MD 05/02/18 1553

## 2018-05-02 NOTE — ED Triage Notes (Signed)
Pt states for the past 3 weeks he has been weak and having no appetite. Pt also feels like his heart is racing at times. Pt denies any cp.

## 2018-05-02 NOTE — ED Notes (Signed)
Patient placed on 2 L/min O2., nasal cannula.

## 2018-05-02 NOTE — ED Provider Notes (Signed)
Assumed care from Dr. Rex Kras at 1600.  Patient had an elevated d-dimer, normal troponin, TSH and elevated lactate.  After IV fluids patient's lactate was 2.04.  Patient CT showed no evidence of PE but does show bilateral pulmonary infiltrates most prominent in upper lobes with rounded regions of cavitation in the bilateral apices this is most consistent with an infectious or inflammatory process atypical infection such as TB could be considered as well as fungal infections but malignancy is considered less likely this is also in the setting of someone with sarcoidosis.  Given patient's recent worsening symptoms, low-grade temperature, tachycardia and mild hypoxia will treat with Rocephin and azithromycin.  Patient will need admission for further evaluation.  May need bronchoscopy in the future.  Patient does not have risk factors for TB such as recent travel, incarceration, alcohol abuse or other issues.   Jeffrey Dessert, MD 05/02/18 [phone]

## 2018-05-02 NOTE — Progress Notes (Signed)
Pharmacy Antibiotic Note  Jeffrey Hayes is a 67 y.o. male admitted on 05/02/2018 with pneumonia.  Pharmacy has been consulted for vancomycin and zosyn dosing. Tmax is 100 and WBC is WNL. SCr is WNL at 0.99 and lactic acid is elevated at 2.04.   Plan: Vanc 1500mg  IV x 1 then 1gm IV Q12H Zosyn 3.375gm IV Q8H (4 hr inf) F/u renal fxn, C&S, clinical status and trough at SS  Height: 6\' 2"  (188 cm) Weight: 166 lb 0.1 oz (75.3 kg) IBW/kg (Calculated) : 82.2  Temp (24hrs), Avg:100 F (37.8 C), Min:100 F (37.8 C), Max:100 F (37.8 C)  Recent Labs  Lab 05/02/18 1051 05/02/18 1516 05/02/18 1756  WBC 8.0  --   --   CREATININE 0.99  --   --   LATICACIDVEN  --  2.27* 2.04*    Estimated Creatinine Clearance: 78.2 mL/min (by C-G formula based on SCr of 0.99 mg/dL).    No Known Allergies  Antimicrobials this admission: Vanc 5/28>> Zosyn 5/28>> CTX x 1 5/28 Azithro x 1 5/28  Dose adjustments this admission: N/A  Microbiology results: Pending  Thank you for allowing pharmacy to be a part of this patient's care.  Rhian Asebedo, Rande Lawman 05/02/2018 8:20 PM

## 2018-05-02 NOTE — H&P (Signed)
History and Physical    Jeffrey Hayes WCH:[id number] DOB: 01/20/1951 DOA: 05/02/2018  PCP: [name], MD   Patient coming from: Home   Chief Complaint: Malaise, wt-loss, DOE  HPI: Jeffrey Hayes is a 67 y.o. male with medical history significant for hypertension, recent diagnosis of diabetes, and sarcoidosis with intra-abdominal manifestations status post recent course of prednisone, now presenting to the emergency department for evaluation of poor appetite with weight loss, palpitations, and exertional dyspnea over the past 3 weeks.  Patient reports abdominal swelling a few months ago that was attributed to sarcoidosis by his rheumatologist and managed with paracentesis and a course of prednisone.  Patient reports that his abdominal symptoms have since resolved and he was in his usual state until the insidious development of anorexia, malaise, and exertional dyspnea.  He reports sputum production but denies hemoptysis.  He was briefly incarcerated decades ago and also volunteered in a homeless shelter remotely.  Does not know of any close contacts with tuberculosis.  ED Course: Upon arrival to the ED, patient is found tohave a temp of 37.8 saturating low 90s on 2 L/min supplemental oxygen,C, tachypneic to 30, tachycardic to 130, and with blood pressure 90/66.  EKG features sinus tachycardia with rate 132 and PVC.  Chest x-ray is notable for diffuse pulmonary reticulonodular opacity with mildly confluent areas in the bilateral upper lobes.  Chemistry panel is notable for sodium of 126, alkaline phosphatase 158, AST 129, ALT 90, and total bilirubin 1.4.  CBC is notable for stable thrombocytopenia with platelets 92,000.  Lactic acid is elevated to 2.27, troponin is undetectable, and d-dimer is elevated to 3.59.  CTA chest is negative for PE, but notable for bilateral infiltrates in the upper lobes with rounded areas of cavitation most consistent with infection/inflammation.  Patient was given a liter  of normal saline and treated with Rocephin and azithromycin in the ED.  Tachycardia has improved and lactic acid has decreased.  He will be admitted for ongoing evaluation and management of cavitary pneumonia.   Review of Systems:  All other systems reviewed and apart from HPI, are negative.  Past Medical History:  Diagnosis Date  . Arthritis   . Diabetes mellitus without complication ([place])   . Hypertension   . Sarcoidosis     Past Surgical History:  Procedure Laterality Date  . CHALAZION EXCISION Left 03/07/2013   Procedure: EXCISION OF CYST LOWER LEFT EYELID;  Surgeon: Myrtha Mantis., MD;  Location: [facility];  Service: Ophthalmology;  Laterality: Left;  . IR PARACENTESIS  01/12/2018  . IR PARACENTESIS  01/20/2018  . IR PARACENTESIS  02/10/2018  . ROTATOR CUFF REPAIR    . SHOULDER SURGERY Left    per pt - had a pin placed.      reports that he quit smoking about 16 months ago. His smoking use included cigarettes. He has a 37.50 pack-year smoking history. He has never used smokeless tobacco. He reports that he drank alcohol. He reports that he does not use drugs.  No Known Allergies  Family History  Problem Relation Age of Onset  . Lung disease Father   . Lung disease Brother   . Alcohol abuse Brother   . Drug abuse Brother      Prior to Admission medications   Medication Sig Start Date End Date Taking? Authorizing Provider  amLODipine (NORVASC) 10 MG tablet Take 10 mg by mouth daily.   Yes [provider]  atorvastatin (LIPITOR) 20 MG  tablet Take 20 mg by mouth every evening.    Yes [provider]  glimepiride (AMARYL) 4 MG tablet Take 4 mg by mouth daily. 04/17/18  Yes [provider]  losartan (COZAAR) 25 MG tablet Take 25 mg by mouth daily.   Yes [provider]  metFORMIN (GLUCOPHAGE) 1000 MG tablet Take 1,000 mg by mouth 2 (two) times daily with a meal.   Yes [provider]  metoprolol succinate  (TOPROL-XL) 50 MG 24 hr tablet Take 50 mg by mouth daily. Take with or immediately following a meal.   Yes [provider]  predniSONE (DELTASONE) 10 MG tablet Take 10 mg by mouth daily with breakfast. 02/13/18  Yes [provider]    Physical Exam: Vitals:   05/02/18 1730 05/02/18 1800 05/02/18 1900 05/02/18 1930  BP: 113/68 111/70 101/66 105/67  Pulse: (!) 107 (!) 116 (!) 112 (!) 114  Resp: (!) 26 (!) 25 (!) 25 (!) 29  Temp:      TempSrc:      SpO2: 95% 92% 93% 92%      Constitutional: NAD, calm, cachectic Eyes: PERTLA, lids and conjunctivae normal ENMT: Mucous membranes are moist. Posterior pharynx clear of any exudate or lesions.   Neck: normal, supple, no masses, no thyromegaly Respiratory: Mildly diminished breath sounds with fine rales and scattered rhonchi bilaterally. Mild tachypnea. No accessory muscle use.  Cardiovascular: Rate ~110 and regular. No extremity edema.   Abdomen: No distension, no tenderness, soft. Bowel sounds normal.  Musculoskeletal: no clubbing / cyanosis. No joint deformity upper and lower extremities.    Skin: no significant rashes, lesions, ulcers. Warm, dry, well-perfused. Neurologic: CN 2-12 grossly intact. Sensation intact. Strength 5/5 in all 4 limbs.  Psychiatric: Alert and oriented x 3. Calm, cooperative.     Labs on Admission: I have personally reviewed following labs and imaging studies  CBC: Recent Labs  Lab 05/02/18 1051  WBC 8.0  HGB 14.3  HCT 43.4  MCV 76.8*  PLT 92*   Basic Metabolic Panel: Recent Labs  Lab 05/02/18 1051  NA 126*  K 4.0  CL 88*  CO2 26  GLUCOSE 166*  BUN 11  CREATININE 0.99  CALCIUM 8.8*   GFR: CrCl cannot be calculated (Unknown ideal weight.). Liver Function Tests: Recent Labs  Lab 05/02/18 1500  AST 129*  ALT 90*  ALKPHOS 158*  BILITOT 1.4*  PROT 5.7*  ALBUMIN 2.2*   No results for input(s): LIPASE, AMYLASE in the last 168 hours. No results for input(s): AMMONIA in the  last 168 hours. Coagulation Profile: No results for input(s): INR, PROTIME in the last 168 hours. Cardiac Enzymes: No results for input(s): CKTOTAL, CKMB, CKMBINDEX, TROPONINI in the last 168 hours. BNP (last 3 results) No results for input(s): PROBNP in the last 8760 hours. HbA1C: No results for input(s): HGBA1C in the last 72 hours. CBG: Recent Labs  Lab 05/02/18 1446  GLUCAP 136*   Lipid Profile: No results for input(s): CHOL, HDL, LDLCALC, TRIG, CHOLHDL, LDLDIRECT in the last 72 hours. Thyroid Function Tests: Recent Labs    05/02/18 1500  TSH 2.416  FREET4 0.91   Anemia Panel: No results for input(s): VITAMINB12, FOLATE, FERRITIN, TIBC, IRON, RETICCTPCT in the last 72 hours. Urine analysis:    Component Value Date/Time   COLORURINE AMBER (A) 05/02/2018 1631   APPEARANCEUR HAZY (A) 05/02/2018 1631   LABSPEC 1.020 05/02/2018 1631   PHURINE 5.0 05/02/2018 1631   GLUCOSEU NEGATIVE 05/02/2018 1631  HGBUR NEGATIVE 05/02/2018 1631   BILIRUBINUR NEGATIVE 05/02/2018 1631   KETONESUR NEGATIVE 05/02/2018 1631   PROTEINUR 30 (A) 05/02/2018 1631   UROBILINOGEN 0.2 02/04/2010 1718   NITRITE NEGATIVE 05/02/2018 1631   LEUKOCYTESUR NEGATIVE 05/02/2018 1631   Sepsis Labs: @LABRCNTIP (procalcitonin:4,lacticidven:4) )No results found for this or any previous visit (from the past 240 hour(s)).   Radiological Exams on Admission: Dg Chest 2 View  Result Date: 05/02/2018 CLINICAL DATA:  67 year old male with cirrhosis. Weakness and loss of appetite. EXAM: CHEST - 2 VIEW COMPARISON:  Chest CT 08/25/2017, radiographs 06/25/2016. FINDINGS: Semi upright AP and lateral views of the chest. Diffuse reticulonodular pulmonary opacity with an upper lobe predominance, and some semi confluent areas of upper lobe involvement. No superimposed pneumothorax or pleural effusion. Mediastinal contours remain normal. Visualized tracheal air column is within normal limits. Chronic postoperative changes to  the left scapula. No acute osseous abnormality identified. Negative visible bowel gas pattern. IMPRESSION: Diffuse pulmonary reticulonodular opacity with some mildly confluent areas in the bilateral upper lobes. No pleural effusion. The appearance is nonspecific. Favor acute viral/atypical respiratory infection. Characterization with Chest CT (noncontrast probably would suffice) may be valuable. Electronically Signed   By: Genevie Ann M.D.   On: 05/02/2018 16:30   Ct Angio Chest Pe W And/or Wo Contrast  Result Date: 05/02/2018 CLINICAL DATA:  Three weeks of decreased appetite and generalized weakness. Shortness of breath with exertion. No fever. EXAM: CT ANGIOGRAPHY CHEST WITH CONTRAST TECHNIQUE: Multidetector CT imaging of the chest was performed using the standard protocol during bolus administration of intravenous contrast. Multiplanar CT image reconstructions and MIPs were obtained to evaluate the vascular anatomy. CONTRAST:  1100m ISOVUE-370 IOPAMIDOL (ISOVUE-370) INJECTION 76% COMPARISON:  Chest x-ray May 02, 2018.  Chest CT September 02, 2017 FINDINGS: Cardiovascular: The thoracic aorta demonstrates atherosclerotic change with no aneurysm or dissection. Stairstep artifact is seen, particularly in the lung bases, limiting evaluation for pulmonary emboli. Taking the artifact into account, there is no convincing evidence of pulmonary emboli. Mediastinum/Nodes: The thyroid is normal. There is a prominent subcarinal node which measures 18 mm and is stable. Shotty nodes in the AP window are stable. There is a prominent right hilar node which was not well assessed on the previous unenhanced study. Lungs/Pleura: Central airways are normal. No pneumothorax. Bilateral pulmonary infiltrates consisting of both numerous small nodules and more confluent regions. The infiltrates are most prominent in the upper lobes with cavitary rounded regions as seen on coronal image 74 measuring up to 3.6 cm in the right upper lobe,  coronal image 67 in the left apex measuring up to 1.9 cm, and coronal image 71 in the left upper lobe measuring up to 1.9 cm. The right middle lobe and lower lobes are involve as well but less severely. No other acute abnormalities identified within the lung parenchyma. Upper Abdomen: Thickening of the right adrenal gland is stable, likely hyperplasia. Increased attenuation in the fat of the omentum within the left upper quadrant is new and nonspecific. Probable minimal ascites. Musculoskeletal: No chest wall abnormality. No acute or significant osseous findings. Review of the MIP images confirms the above findings. IMPRESSION: 1. Bilateral pulmonary infiltrates most prominent in the upper lobes with rounded regions of cavitation in the bilateral apices. Findings are most consistent with an infectious or inflammatory process. Atypical infections such as tuberculosis should be considered. Non tuberculous mycobacterial infections and fungal infections are also possible. Malignancy is considered less likely. Inflammatory causes such as sarcoidosis could also present  with upper lobe cavitating nodules. There are a few scattered prominent nodes in the mediastinum which are stable. There is a prominent right hilar node which was not well assessed on previous unenhanced imaging. 2. Atherosclerotic change in the thoracic aorta. 3. Evaluation for pulmonary emboli is limited due to artifact but no emboli are identified. Findings called to the patient's ER doctor, Dr. Maryan Rued. Aortic Atherosclerosis (ICD10-I70.0). Electronically Signed   By: Dorise Bullion III M.D   On: 05/02/2018 18:02    EKG: Independently reviewed. Sinus tachycardia (rate 132), PVC.   Assessment/Plan  1. Cavitary pneumonia  - Presents with 3 wks of progressive malaise, wt loss (45 lbs per family), palpitations, and dyspnea  - Found to have cavitary infiltrates in bilateral apices on CT  - Treated with Rocephin and azithromycin in ED  - He has  hx of incarceration and volunteered in homeless shelter, both decades ago, and recently immunosuppressed with prednisone  - Airborne precautions, culture blood and sputum, AFB sputum cultures, continue empiric abx for community-acquired PNA with addition of vancomycin in light of cavitations    2. Sarcoidosis  - Follows with rheumatology and reports recent intraabdominal manifestations that clinically resolved with a course of prednisone  - Not on maintenance therapy    3. Hyponatremia  - Serum sodium is 126 on admission in setting of hypovolemia  - Treated with 1 liter NS in ED, given another 500 cc bolus on admission and continued on NS infusion  - Repeat chem panel in am   4. Thrombocytopenia  - Platelets 92k on admission  - Possibly secondary to sarcoidosis  - Appears to be stable and no active bleeding   5. Elevated LFT's  - Mild elevations in alk phos, transaminases, and bilirubin noted  - Abdominal exam is benign  - Pt reports ascites a few months ago s/p paracentesis, attributed to sarcoidosis by his rheumatologist, and status-post prednisone taper   - Trend LFT's, follow clinically   6. Type II DM  - No A1c on file, recent diagnosis and possibly steroid-induced  - Follow CBG's and use a low-intensity SSI with Novolog as needed     DVT prophylaxis: SCD's Code Status: Full  Family Communication: Significant other updated at bedside Consults called: None Admission status: Inpatient    Vianne Bulls, MD [facility] Pager [phone]  If 7PM-7AM, please contact night-coverage [email or web address] Password [facility]  05/02/2018, 8:05 PM

## 2018-05-02 NOTE — ED Notes (Signed)
Requested hospital bed from [facility] 

## 2018-05-02 NOTE — ED Notes (Signed)
Paged Harwani to [employer]

## 2018-05-03 ENCOUNTER — Other Ambulatory Visit: Payer: Self-pay

## 2018-05-03 ENCOUNTER — Encounter (HOSPITAL_COMMUNITY): Payer: Self-pay | Admitting: *Deleted

## 2018-05-03 DIAGNOSIS — R918 Other nonspecific abnormal finding of lung field: Secondary | ICD-10-CM

## 2018-05-03 DIAGNOSIS — J189 Pneumonia, unspecified organism: Secondary | ICD-10-CM

## 2018-05-03 DIAGNOSIS — R6520 Severe sepsis without septic shock: Secondary | ICD-10-CM

## 2018-05-03 DIAGNOSIS — J984 Other disorders of lung: Secondary | ICD-10-CM

## 2018-05-03 DIAGNOSIS — R0902 Hypoxemia: Secondary | ICD-10-CM

## 2018-05-03 DIAGNOSIS — A419 Sepsis, unspecified organism: Secondary | ICD-10-CM | POA: Diagnosis present

## 2018-05-03 LAB — GLUCOSE, CAPILLARY: Glucose-Capillary: 118 mg/dL — ABNORMAL HIGH (ref 65–99)

## 2018-05-03 LAB — CBC WITH DIFFERENTIAL/PLATELET
BASOS PCT: 0 %
Basophils Absolute: 0 10*3/uL (ref 0.0–0.1)
Eosinophils Absolute: 0 10*3/uL (ref 0.0–0.7)
Eosinophils Relative: 0 %
HCT: 35.4 % — ABNORMAL LOW (ref 39.0–52.0)
HEMOGLOBIN: 11.3 g/dL — AB (ref 13.0–17.0)
LYMPHS PCT: 2 %
Lymphs Abs: 0.1 10*3/uL — ABNORMAL LOW (ref 0.7–4.0)
MCH: 25.3 pg — AB (ref 26.0–34.0)
MCHC: 31.9 g/dL (ref 30.0–36.0)
MCV: 79.2 fL (ref 78.0–100.0)
MONO ABS: 0.1 10*3/uL (ref 0.1–1.0)
Monocytes Relative: 2 %
NEUTROS PCT: 96 %
Neutro Abs: 5.6 10*3/uL (ref 1.7–7.7)
PLATELETS: 64 10*3/uL — AB (ref 150–400)
RBC: 4.47 MIL/uL (ref 4.22–5.81)
RDW: 17.8 % — ABNORMAL HIGH (ref 11.5–15.5)
WBC: 5.8 10*3/uL (ref 4.0–10.5)

## 2018-05-03 LAB — HIV ANTIBODY (ROUTINE TESTING W REFLEX): HIV Screen 4th Generation wRfx: NONREACTIVE

## 2018-05-03 LAB — CBG MONITORING, ED
GLUCOSE-CAPILLARY: 100 mg/dL — AB (ref 65–99)
GLUCOSE-CAPILLARY: 88 mg/dL (ref 65–99)
Glucose-Capillary: 244 mg/dL — ABNORMAL HIGH (ref 65–99)
Glucose-Capillary: 83 mg/dL (ref 65–99)

## 2018-05-03 LAB — LACTIC ACID, PLASMA: LACTIC ACID, VENOUS: 1 mmol/L (ref 0.5–1.9)

## 2018-05-03 LAB — STREP PNEUMONIAE URINARY ANTIGEN: Strep Pneumo Urinary Antigen: NEGATIVE

## 2018-05-03 MED ORDER — SODIUM CHLORIDE 0.9 % IV SOLN
INTRAVENOUS | Status: AC
  Administered 2018-05-03: 10:00:00 via INTRAVENOUS

## 2018-05-03 MED ORDER — SODIUM CHLORIDE 3 % IN NEBU
4.0000 mL | INHALATION_SOLUTION | Freq: Every day | RESPIRATORY_TRACT | Status: AC
  Administered 2018-05-03 – 2018-05-05 (×3): 4 mL via RESPIRATORY_TRACT
  Filled 2018-05-03 (×4): qty 4

## 2018-05-03 MED ORDER — PANTOPRAZOLE SODIUM 20 MG PO TBEC
20.0000 mg | DELAYED_RELEASE_TABLET | Freq: Every day | ORAL | Status: AC
  Administered 2018-05-03: 20 mg via ORAL
  Filled 2018-05-03: qty 1

## 2018-05-03 NOTE — ED Notes (Signed)
IM at bedside.

## 2018-05-03 NOTE — Consult Note (Signed)
Jeffrey Hayes for Infectious Disease    Date of Admission:  05/02/2018  Total days of antibiotics 2               Reason for Consult: Cavitary lung lesions     Referring Provider: Dr. Myna Hidalgo  Primary Care Provider: Dr. Terrence Dupont   Assessment: Jeffrey Hayes is a 67 yo male with history of intra-abdominal sarcoidosis on prednisone x 3-4 months, alcoholic liver cirrhosis, T2DM, and chronic thrombocytopenia who presents with high grade fever, chills, night sweats, anorexia, weight loss, and was found to have several cavitary lung lesions on bilateral upper lobes concerning for TB.   Plan: 1. Agree with AFB smear x3 and AFB culture  2. Follow up HIV, hepatitis labs, Legionella and S. pneumo antigen  3. Agree with continuing vancomycin and zosyn for possible superimposed  bacterial infection  4. Agree with pulmonology consult, appreciate assistance   Principal Problem:   Cavitary pneumonia Active Problems:   Diabetes mellitus without complication ([place])   Sarcoidosis   Hypertension   Thrombocytopenia ([place])   Hyponatremia   LFTs abnormal   Scheduled Meds: . atorvastatin  20 mg Oral q1800  . insulin aspart  0-5 Units Subcutaneous QHS  . insulin aspart  0-9 Units Subcutaneous TID WC  . metoprolol succinate  50 mg Oral Hayes  . sodium chloride flush  3 mL Intravenous Q12H   Continuous Infusions: . sodium chloride    . piperacillin-tazobactam (ZOSYN)  IV Stopped (05/03/18 0553)  . vancomycin     PRN Meds:.acetaminophen **OR** acetaminophen, bisacodyl, HYDROcodone-acetaminophen, ondansetron **OR** ondansetron (ZOFRAN) IV, senna-docusate  HPI: Jeffrey Hayes is a 67 y.o. male with history of sarcoidosis, liver cirrhosis, T2DM, HTN, and chronic thrombocytopenia who presented on 5/28 due to malaise and anorexia. Patient has been experiencing loss of appetite since 12/2017. Has lost 50-60 lbs since then. During this time he had been experiencing abdominal pain and swelling and  required a paracentecesis. He was recently diagnosed with intra-abdominal sarcoidosis on 01/2018 and has been taking prednisone for the past 3-4 months. One to two weeks ago he started feeling "strange and not like myself." He endorses chills, night sweats, abdominal pain, and further weight loss. Denies fever, chest pain, palpitations, SOB, cough, urinary symptoms and changes in bowel movements. Reports being in prison ~ 40 years ago. No tattoos or recent sick contacts. No close contacts with TB. Denies homelessness. Grew up in [place] and denies traveling within the [place] or intentional. No pets in the house. Family history significant for lung cancer in father (deceased). He follows up with Dr. Chase Caller for emphysema and R lung nodule.    Review of Systems: Review of Systems  Constitutional: Positive for chills, diaphoresis, fever, malaise/fatigue and weight loss.  Respiratory: Positive for cough. Negative for shortness of breath.   Cardiovascular: Negative for chest pain, palpitations and leg swelling.  Gastrointestinal: Negative for abdominal pain, constipation, diarrhea, nausea and vomiting.  Genitourinary: Negative for dysuria, flank pain and hematuria.  Musculoskeletal: Negative for joint pain and myalgias.  Neurological: Negative for dizziness and headaches.    Past Medical History:  Diagnosis Date  . Arthritis   . Diabetes mellitus without complication ([place])   . Hypertension   . Sarcoidosis     Social History   Tobacco Use  . Smoking status: Former Smoker    Packs/day: 0.75    Years: 50.00    Pack years: 37.50    Types: Cigarettes  Last attempt to quit: 12/06/2016    Years since quitting: 1.4  . Smokeless tobacco: Never Used  Substance Use Topics  . Alcohol use: Not Currently  . Drug use: No    Family History  Problem Relation Age of Onset  . Lung disease Father   . Lung disease Brother   . Alcohol abuse Brother   . Drug abuse Brother    No Known  Allergies  OBJECTIVE: Blood pressure (!) 99/52, pulse 99, temperature (!) 103.4 F (39.7 C), temperature source Rectal, resp. rate (!) 23, height 6\' 2"  (1.88 m), weight 166 lb 0.1 oz (75.3 kg), SpO2 100 %.  Physical Exam  Constitutional: He is oriented to person, place, and time.  Elderly male, appears uncomfortable and diaphoretic. He is unable to speak in full sentences.   HENT:  Head: Normocephalic and atraumatic.  Mouth/Throat: Oropharynx is clear and moist.  Dry mucus membranes   Eyes: Pupils are equal, round, and reactive to light.  Scleral icterus  Neck: Normal range of motion.  Cardiovascular:  Tachycardic, nl S1/S2, no mrg   Pulmonary/Chest:  Appears tachypneic. CTAB. Satting well on 2L O2  Abdominal: Soft. Bowel sounds are normal. There is no tenderness.  Musculoskeletal: He exhibits no edema or deformity.  Lymphadenopathy:    He has no cervical adenopathy.  Neurological: He is alert and oriented to person, place, and time.  Skin: Skin is warm. No rash noted.    Lab Results Lab Results  Component Value Date   WBC 5.8 05/03/2018   HGB 11.3 (L) 05/03/2018   HCT 35.4 (L) 05/03/2018   MCV 79.2 05/03/2018   PLT 64 (L) 05/03/2018    Lab Results  Component Value Date   CREATININE 0.99 05/02/2018   BUN 11 05/02/2018   NA 126 (L) 05/02/2018   K 4.0 05/02/2018   CL 88 (L) 05/02/2018   CO2 26 05/02/2018    Lab Results  Component Value Date   ALT 90 (H) 05/02/2018   AST 129 (H) 05/02/2018   ALKPHOS 158 (H) 05/02/2018   BILITOT 1.4 (H) 05/02/2018     Microbiology: No results found for this or any previous visit (from the past 240 hour(s)).  Welford Roche, [place] for [place] [phone] pager   [phone] cell 05/03/2018, 9:52 AM

## 2018-05-03 NOTE — ED Notes (Addendum)
Tylenol 650mg  given PO. Unable to scan due to receiving this message "Order locked by Gardiner Barefoot, NP since [id number] at workstation [id number]." Verified w/ Tandy Gaw, ED RN.

## 2018-05-03 NOTE — Progress Notes (Signed)
PROGRESS NOTE    Patient: Jeffrey Hayes     PCP: [name], MD                    DOB: Jun 27, 1951            DOA: 05/02/2018 GYK:[id number]             DOS: 05/03/2018, 1:22 PM   LOS: 1 day   Date of Service: The patient was seen and examined on 05/03/2018  Subjective:  Patient was seen and examined this morning, still febrile, temp of 103.4, tachycardic 102, tachypneic respiratory rate is 26, blood pressure improved from 91/66 to 109/73, satting 88% room air, 100% on 2 L of oxygen  Pulmonary critical team Dr. Edison Nasuti has been consulted, anticipating urgent evaluation, possible bronchoscopy. We will keep the patient n.p.o. For now   ----------------------------------------------------------------------------------------------------------------------  Brief Narrative:   Jeffrey Hayes is a 67 y.o. male with medical history significant for hypertension, recent diagnosis of diabetes, and sarcoidosis with intra-abdominal manifestations status post recent course of prednisone, now presenting to the emergency department for evaluation of poor appetite with weight loss, palpitations, and exertional dyspnea over the past 3 weeks.  Patient reports abdominal swelling a few months ago that was attributed to sarcoidosis by his rheumatologist and managed with paracentesis and a course of prednisone.  Patient reports that his abdominal symptoms have since resolved and he was in his usual state until the insidious development of anorexia, malaise, and exertional dyspnea.  He reports sputum production but denies hemoptysis.  He was briefly incarcerated decades ago and also volunteered in a homeless shelter remotely.  Does not know of any close contacts with tuberculosis.  CTA of the chest revealed no PE, bilateral infiltrate, upper lobe cavitary lesion consistent with infection/infiltrate Cultures were obtained, aggressive IV fluid has been initiated with IV antibiotics  Continue to be febrile, with lactic  acid of 2.7, no leukocytosis, hypotensive, tachycardic, Infectious disease and pulmonary was consulted    Principal Problem:   Cavitary pneumonia Active Problems:   Diabetes mellitus without complication ([place])   Sarcoidosis   Hypertension   Thrombocytopenia (HCC)   Hyponatremia   LFTs abnormal   Assessment & Plan:    Sepsis / cavitary lesion/bilateral pneumonia  - CTA of the chest revealed no PE, bilateral infiltrate, upper lobe cavitary lesion consistent with infection/infiltrate -Patient continues to be febrile,  temp of 103.4, tachycardic 102, tachypneic respiratory rate is 26, blood pressure improved from 91/66 to 109/73, satting 88% room air, 100% on 2 L of oxygen -We will follow the cultures, continue IV antibiotics -Per history has been malaise for the past 3 weeks, weight loss over 45 pounds  - He has hx of incarceration and volunteered in homeless shelter, both decades ago, and recently immunosuppressed with prednisone  - Airborne precautions, culture blood and sputum, AFB sputum cultures, continue empiric abx for community-acquired PNA with addition of vancomycin in light of cavitations    -Infectious disease team and pulmonary team consulted for further evaluation recommendation and possible bronchoscopy.   Sarcoidosis  -Stable, follow with rheumatology and reports recent intra-abdominal manifestations are clinically resolved with a course of prednisone, no maintenance dose    Hyponatremia  -On admission sodium is 126 -In the setting of hypovolemia, will continue IV fluid hydration -We will monitor sodium level closely  Thrombocytopenia  - On admission platelets was 92, currently 64-setting of sepsis and sarcoidosis -Stable no signs of bleeding   Elevated  LFT's  - Mild elevations in alk phos, transaminases, and bilirubin noted  - No acute abdominal findings on exam - Pt reports ascites a few months ago s/p paracentesis, attributed to sarcoidosis by his  rheumatologist, and status-post prednisone taper   - Trend LFT's, follow clinically   Type II DM  - No A1c on file, recent diagnosis and possibly steroid-induced  - Follow CBG's and use a low-intensity SSI with Novolog as needed     DVT prophylaxis: SCD's Code Status: Full  Family Communication: Significant other updated at bedside Consults called:  Infectious disease team/pulmonologist, critical care team Dr. Edison Nasuti Admission status: Inpatient    Procedures:    Dissipating bronchoscopy  Antimicrobials:  Anti-infectives (From admission, onward)   Start     Dose/Rate Route Frequency Ordered Stop   05/03/18 1000  vancomycin (VANCOCIN) IVPB 1000 mg/200 mL premix     1,000 mg 200 mL/hr over 60 Minutes Intravenous Every 12 hours 05/02/18 2020     05/03/18 0200  piperacillin-tazobactam (ZOSYN) IVPB 3.375 g     3.375 g 12.5 mL/hr over 240 Minutes Intravenous Every 8 hours 05/02/18 2020     05/02/18 2030  piperacillin-tazobactam (ZOSYN) IVPB 3.375 g     3.375 g 100 mL/hr over 30 Minutes Intravenous  Once 05/02/18 2017 05/02/18 2335   05/02/18 2030  vancomycin (VANCOCIN) 1,500 mg in sodium chloride 0.9 % 500 mL IVPB     1,500 mg 250 mL/hr over 120 Minutes Intravenous  Once 05/02/18 2017 05/02/18 2330   05/02/18 1830  cefTRIAXone (ROCEPHIN) 1 g in sodium chloride 0.9 % 100 mL IVPB  Status:  Discontinued     1 g 200 mL/hr over 30 Minutes Intravenous  Once 05/02/18 1828 05/02/18 2015   05/02/18 1830  azithromycin (ZITHROMAX) tablet 500 mg     500 mg Oral  Once 05/02/18 1828 05/02/18 1947        Objective: Vitals:   05/03/18 1115 05/03/18 1130 05/03/18 1200 05/03/18 1230  BP: 105/71 104/67 118/71 109/73  Pulse: (!) 107 (!) 109 100 (!) 102  Resp: (!) 22 (!) 22 (!) 27 (!) 26  Temp:      TempSrc:      SpO2: 97% 97% 92% (!) 88%  Weight:      Height:        Intake/Output Summary (Last 24 hours) at 05/03/2018 1322 Last data filed at 05/03/2018 1200 Gross per 24 hour  Intake  2804.37 ml  Output 300 ml  Net 2504.37 ml   Filed Weights   05/02/18 2000  Weight: 75.3 kg (166 lb 0.1 oz)    Examination:  General exam: Awake alert, cachectic looking man, HEENT: WNLs Respiratory system: Continues to have shortness of breath, rhonchi rales in the upper lobes, reduced breath sounds, rubs present in the upper lobes, crackles at lower lobes Cardiovascular system: Tachycardic, no JVD, murmurs, rubs, gallops or clicks. No pedal edema. Gastrointestinal system: Abd. nondistended, soft and nontender. No organomegaly or masses felt. Normal bowel sounds heard. Central nervous system: Alert and oriented. No focal neurological deficits. Extremities: Symmetric 5 x 5 power. Skin: No rashes, lesions or ulcers Wounds:   Data Reviewed: I have personally reviewed following labs and imaging studies  CBC: Recent Labs  Lab 05/02/18 1051 05/03/18 0056  WBC 8.0 5.8  NEUTROABS  --  5.6  HGB 14.3 11.3*  HCT 43.4 35.4*  MCV 76.8* 79.2  PLT 92* 64*   Basic Metabolic Panel: Recent Labs  Lab 05/02/18 1051  NA 126*  K 4.0  CL 88*  CO2 26  GLUCOSE 166*  BUN 11  CREATININE 0.99  CALCIUM 8.8*   GFR: Estimated Creatinine Clearance: 78.2 mL/min (by C-G formula based on SCr of 0.99 mg/dL). Liver Function Tests: Recent Labs  Lab 05/02/18 1500  AST 129*  ALT 90*  ALKPHOS 158*  BILITOT 1.4*  PROT 5.7*  ALBUMIN 2.2*   CBG: Recent Labs  Lab 05/02/18 1446 05/02/18 2301 05/03/18 0736 05/03/18 1126  GLUCAP 136* 144* 100* 244*   LThyroid Function Tests: Recent Labs    05/02/18 1500  TSH 2.416  FREET4 0.91  Sepsis Labs: Recent Labs  Lab 05/02/18 1516 05/02/18 1756 05/03/18 0056  LATICACIDVEN 2.27* 2.04* 1.0    No results found for this or any previous visit (from the past 240 hour(s)).    Radiology Studies: Dg Chest 2 View  Result Date: 05/02/2018 CLINICAL DATA:  67 year old male with cirrhosis. Weakness and loss of appetite. EXAM: CHEST - 2 VIEW  COMPARISON:  Chest CT 08/25/2017, radiographs 06/25/2016. FINDINGS: Semi upright AP and lateral views of the chest. Diffuse reticulonodular pulmonary opacity with an upper lobe predominance, and some semi confluent areas of upper lobe involvement. No superimposed pneumothorax or pleural effusion. Mediastinal contours remain normal. Visualized tracheal air column is within normal limits. Chronic postoperative changes to the left scapula. No acute osseous abnormality identified. Negative visible bowel gas pattern. IMPRESSION: Diffuse pulmonary reticulonodular opacity with some mildly confluent areas in the bilateral upper lobes. No pleural effusion. The appearance is nonspecific. Favor acute viral/atypical respiratory infection. Characterization with Chest CT (noncontrast probably would suffice) may be valuable. Electronically Signed   By: Genevie Ann M.D.   On: 05/02/2018 16:30   Ct Angio Chest Pe W And/or Wo Contrast  Result Date: 05/02/2018 CLINICAL DATA:  Three weeks of decreased appetite and generalized weakness. Shortness of breath with exertion. No fever. EXAM: CT ANGIOGRAPHY CHEST WITH CONTRAST TECHNIQUE: Multidetector CT imaging of the chest was performed using the standard protocol during bolus administration of intravenous contrast. Multiplanar CT image reconstructions and MIPs were obtained to evaluate the vascular anatomy. CONTRAST:  125m ISOVUE-370 IOPAMIDOL (ISOVUE-370) INJECTION 76% COMPARISON:  Chest x-ray May 02, 2018.  Chest CT September 02, 2017 FINDINGS: Cardiovascular: The thoracic aorta demonstrates atherosclerotic change with no aneurysm or dissection. Stairstep artifact is seen, particularly in the lung bases, limiting evaluation for pulmonary emboli. Taking the artifact into account, there is no convincing evidence of pulmonary emboli. Mediastinum/Nodes: The thyroid is normal. There is a prominent subcarinal node which measures 18 mm and is stable. Shotty nodes in the AP window are stable.  There is a prominent right hilar node which was not well assessed on the previous unenhanced study. Lungs/Pleura: Central airways are normal. No pneumothorax. Bilateral pulmonary infiltrates consisting of both numerous small nodules and more confluent regions. The infiltrates are most prominent in the upper lobes with cavitary rounded regions as seen on coronal image 74 measuring up to 3.6 cm in the right upper lobe, coronal image 67 in the left apex measuring up to 1.9 cm, and coronal image 71 in the left upper lobe measuring up to 1.9 cm. The right middle lobe and lower lobes are involve as well but less severely. No other acute abnormalities identified within the lung parenchyma. Upper Abdomen: Thickening of the right adrenal gland is stable, likely hyperplasia. Increased attenuation in the fat of the omentum within the left upper quadrant is new and nonspecific. Probable minimal ascites. Musculoskeletal:  No chest wall abnormality. No acute or significant osseous findings. Review of the MIP images confirms the above findings. IMPRESSION: 1. Bilateral pulmonary infiltrates most prominent in the upper lobes with rounded regions of cavitation in the bilateral apices. Findings are most consistent with an infectious or inflammatory process. Atypical infections such as tuberculosis should be considered. Non tuberculous mycobacterial infections and fungal infections are also possible. Malignancy is considered less likely. Inflammatory causes such as sarcoidosis could also present with upper lobe cavitating nodules. There are a few scattered prominent nodes in the mediastinum which are stable. There is a prominent right hilar node which was not well assessed on previous unenhanced imaging. 2. Atherosclerotic change in the thoracic aorta. 3. Evaluation for pulmonary emboli is limited due to artifact but no emboli are identified. Findings called to the patient's ER doctor, Dr. Maryan Rued. Aortic Atherosclerosis  (ICD10-I70.0). Electronically Signed   By: Dorise Bullion III M.D   On: 05/02/2018 18:02    Scheduled Meds: . atorvastatin  20 mg Oral q1800  . insulin aspart  0-5 Units Subcutaneous QHS  . insulin aspart  0-9 Units Subcutaneous TID WC  . metoprolol succinate  50 mg Oral Daily  . sodium chloride flush  3 mL Intravenous Q12H   Continuous Infusions: . sodium chloride 125 mL/hr at 05/03/18 1027  . piperacillin-tazobactam (ZOSYN)  IV 12.5 mL/hr at 05/03/18 1201  . vancomycin Stopped (05/03/18 1200)    Time spent: >35 minutes  Deatra James, MD [facility],  Pager [phone]  If 7PM-7AM, please contact night-coverage [email or web address]   Password [email or web address]  05/03/2018, 1:22 PM

## 2018-05-03 NOTE — Consult Note (Signed)
Name: RAYN ENDERSON MRN: [id number] DOB: 09-26-1951    ADMISSION DATE:  05/02/2018 CONSULTATION DATE: 05/03/2018  REFERRING MD : [facility]  CHIEF COMPLAINT: Exertional dyspnea  BRIEF PATIENT DESCRIPTION: 67 year old with progressive dyspnea  SIGNIFICANT EVENTS    STUDIES:  529 CT scan is noted   HISTORY OF PRESENT ILLNESS:   [name] is a 67 year old male who has a past medical history that is notable for diabetes, sarcoidosis which was diagnosed via biopsy, history of cirrhosis with recent paracentesis of 4 L of clear fluid obtained.  He presents with development of weight loss loss of appetite and exertional dyspnea.  He does report coughing up sputum but denies hemoptysis.  He was evaluated by [facility] hospitalist CT scan was performed which shows extensive cavitary pneumonia and infiltrates.  Pulmonary critical care has been consulted to evaluate.  Radiographic findings make tuberculosis strong candidate.  He will have sputum was induced for AFB but this is not sufficient he will may require bronchoscopy in the future.  Note that he has been evaluated for HIV.   PAST MEDICAL HISTORY :   has a past medical history of Arthritis, Diabetes mellitus without complication ([place]), Hypertension, and Sarcoidosis.  has a past surgical history that includes Chalazion excision (Left, 03/07/2013); Rotator cuff repair; Shoulder surgery (Left); IR Paracentesis (01/12/2018); IR Paracentesis (01/20/2018); and IR Paracentesis (02/10/2018). Prior to Admission medications   Medication Sig Start Date End Date Taking? Authorizing Provider  amLODipine (NORVASC) 10 MG tablet Take 10 mg by mouth daily.   Yes [provider]  atorvastatin (LIPITOR) 20 MG tablet Take 20 mg by mouth every evening.    Yes [provider]  glimepiride (AMARYL) 4 MG tablet Take 4 mg by mouth daily. 04/17/18  Yes [provider]  losartan (COZAAR) 25 MG tablet Take 25 mg by mouth daily.   Yes [provider]  metFORMIN (GLUCOPHAGE) 1000 MG tablet Take 1,000 mg by mouth 2 (two) times daily with a meal.   Yes [provider]  metoprolol succinate (TOPROL-XL) 50 MG 24 hr tablet Take 50 mg by mouth daily. Take with or immediately following a meal.   Yes [provider]  predniSONE (DELTASONE) 10 MG tablet Take 10 mg by mouth daily with breakfast. 02/13/18  Yes [provider]   No Known Allergies  FAMILY HISTORY:  family history includes Alcohol abuse in his brother; Drug abuse in his brother; Lung disease in his brother and father. SOCIAL HISTORY:  reports that he quit smoking about 16 months ago. His smoking use included cigarettes. He has a 37.50 pack-year smoking history. He has never used smokeless tobacco. He reports that he drank alcohol. He reports that he does not use drugs.  REVIEW OF SYSTEMS:   10 point review of system taken, please see HPI for positives and negatives.   SUBJECTIVE:  Feels better since has been in the hospital  VITAL SIGNS: Temp:  [101.1 F (38.4 C)-103.8 F (39.9 C)] 103.4 F (39.7 C) (05/29 0159) Pulse Rate:  [96-124] 109 (05/29 1130) Resp:  [19-31] 22 (05/29 1130) BP: (90-127)/(52-77) 104/67 (05/29 1130) SpO2:  [91 %-100 %] 97 % (05/29 1130) Weight:  [75.3 kg (166 lb 0.1 oz)] 75.3 kg (166 lb 0.1 oz) (05/28 2000)  PHYSICAL EXAMINATION: General:  Chronically ill appearing male, NAD Neuro:  Alert and interactive, moving all ext to command HEENT:  [place]/AT, PERRL, EOM-I and MMM Cardiovascular:  RRR, Nl S1/S2 and -M/R/G Lungs:  Decreased BS diffusely  Abdomen:  Soft, NT, ND and +BS Musculoskeletal:  -edema and -tenderness Skin:  Intact  Recent Labs  Lab 05/02/18 1051  NA 126*  K 4.0  CL 88*  CO2 26  BUN 11  CREATININE 0.99  GLUCOSE 166*   Recent Labs  Lab 05/02/18 1051 05/03/18 0056  HGB 14.3 11.3*  HCT 43.4 35.4*  WBC 8.0 5.8  PLT 92* 64*   Dg Chest 2 View  Result Date: 05/02/2018 CLINICAL DATA:  67 year old  male with cirrhosis. Weakness and loss of appetite. EXAM: CHEST - 2 VIEW COMPARISON:  Chest CT 08/25/2017, radiographs 06/25/2016. FINDINGS: Semi upright AP and lateral views of the chest. Diffuse reticulonodular pulmonary opacity with an upper lobe predominance, and some semi confluent areas of upper lobe involvement. No superimposed pneumothorax or pleural effusion. Mediastinal contours remain normal. Visualized tracheal air column is within normal limits. Chronic postoperative changes to the left scapula. No acute osseous abnormality identified. Negative visible bowel gas pattern. IMPRESSION: Diffuse pulmonary reticulonodular opacity with some mildly confluent areas in the bilateral upper lobes. No pleural effusion. The appearance is nonspecific. Favor acute viral/atypical respiratory infection. Characterization with Chest CT (noncontrast probably would suffice) may be valuable. Electronically Signed   By: Genevie Ann M.D.   On: 05/02/2018 16:30   Ct Angio Chest Pe W And/or Wo Contrast  Result Date: 05/02/2018 CLINICAL DATA:  Three weeks of decreased appetite and generalized weakness. Shortness of breath with exertion. No fever. EXAM: CT ANGIOGRAPHY CHEST WITH CONTRAST TECHNIQUE: Multidetector CT imaging of the chest was performed using the standard protocol during bolus administration of intravenous contrast. Multiplanar CT image reconstructions and MIPs were obtained to evaluate the vascular anatomy. CONTRAST:  12mL ISOVUE-370 IOPAMIDOL (ISOVUE-370) INJECTION 76% COMPARISON:  Chest x-ray May 02, 2018.  Chest CT September 02, 2017 FINDINGS: Cardiovascular: The thoracic aorta demonstrates atherosclerotic change with no aneurysm or dissection. Stairstep artifact is seen, particularly in the lung bases, limiting evaluation for pulmonary emboli. Taking the artifact into account, there is no convincing evidence of pulmonary emboli. Mediastinum/Nodes: The thyroid is normal. There is a prominent subcarinal node which  measures 18 mm and is stable. Shotty nodes in the AP window are stable. There is a prominent right hilar node which was not well assessed on the previous unenhanced study. Lungs/Pleura: Central airways are normal. No pneumothorax. Bilateral pulmonary infiltrates consisting of both numerous small nodules and more confluent regions. The infiltrates are most prominent in the upper lobes with cavitary rounded regions as seen on coronal image 74 measuring up to 3.6 cm in the right upper lobe, coronal image 67 in the left apex measuring up to 1.9 cm, and coronal image 71 in the left upper lobe measuring up to 1.9 cm. The right middle lobe and lower lobes are involve as well but less severely. No other acute abnormalities identified within the lung parenchyma. Upper Abdomen: Thickening of the right adrenal gland is stable, likely hyperplasia. Increased attenuation in the fat of the omentum within the left upper quadrant is new and nonspecific. Probable minimal ascites. Musculoskeletal: No chest wall abnormality. No acute or significant osseous findings. Review of the MIP images confirms the above findings. IMPRESSION: 1. Bilateral pulmonary infiltrates most prominent in the upper lobes with rounded regions of cavitation in the bilateral apices. Findings are most consistent with an infectious or inflammatory process. Atypical infections such as tuberculosis should be considered. Non tuberculous mycobacterial infections and fungal infections are also possible. Malignancy is considered less likely. Inflammatory causes such  as sarcoidosis could also present with upper lobe cavitating nodules. There are a few scattered prominent nodes in the mediastinum which are stable. There is a prominent right hilar node which was not well assessed on previous unenhanced imaging. 2. Atherosclerotic change in the thoracic aorta. 3. Evaluation for pulmonary emboli is limited due to artifact but no emboli are identified. Findings called to the  patient's ER doctor, Dr. Maryan Rued. Aortic Atherosclerosis (ICD10-I70.0). Electronically Signed   By: Dorise Bullion III M.D   On: 05/02/2018 18:02    ASSESSMENT Principal Problem:   Cavitary pneumonia Active Problems:   Diabetes mellitus without complication ([place])   Sarcoidosis   Hypertension   Thrombocytopenia ([place])   Hyponatremia   LFTs abnormal  Cirrhosis    Discussion: [name] is a 67 year old male who has a past medical history that is notable for diabetes, sarcoidosis which was diagnosed via biopsy, history of cirrhosis with recent paracentesis of 4 L of clear fluid obtained.  He presents with development of weight loss loss of appetite and exertional dyspnea.  He does report coughing up sputum but denies hemoptysis.  He was evaluated by [facility] hospitalist CT scan was performed which shows extensive cavitary pneumonia and infiltrates.  Pulmonary critical care has been consulted to evaluate.  Radiographic findings make tuberculosis strong candidate.  He will have sputum was induced for AFB but this is not sufficient he will may require bronchoscopy in the future.  Note that he has been evaluated for HIV.      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  Patient was in his normal state of health until 4 months ago when he was diagnosed with sarcoidosis through an omental biopsy during his evaluation for liver failure.  Patient was  started on 40 mg of prednisone daily for 1 month then dropped to 35 mg PO daily x1 month then 30 mg daily.  He stopped taking his prednisone 2 wks ago after felling unwell as above.  Also reports fever x2 months, SOB and wt loss of 37 lbs in 4 months.  No hemoptysis or contact that he recalls with TB.  On exam, distant BS diffusely, chronically ill appearing.  I reviewed CT myself, biapical infiltrate and cavitary lesions noted.  Patient is producing copious secretions.  Discussed with ID and PCCM-NP.  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 steroids  - Quantiferon gold  - HIV  - Respiratory isolation  - Sputum induction x3  - ID following, appreciate input  Cavitary lesions: TB vs cavitary PNA vs cancer  - As above  - Will need bronch is sputum is negative for TB but fear is that if bronched now and TB is negative will show atypical cells due to infection and would cause [place] to pursue more aggressive diagnostic means.  - If findings persist post treatment of HCAP then would consider bronchoscopy then  HCAP:  - Pan culture  - Vanc  - Zosyn  -  Tailor when cultures are available  Hypoxemia  - Titrate O2 for sat of 88-92%  PCCM will follow  Patient seen and examined, agree with above note.  I dictated the care and orders written for this patient under my direction.  Rush Farmer, [place]

## 2018-05-03 NOTE — ED Notes (Signed)
PAGED ADMITTING 

## 2018-05-04 DIAGNOSIS — R6520 Severe sepsis without septic shock: Secondary | ICD-10-CM

## 2018-05-04 DIAGNOSIS — D869 Sarcoidosis, unspecified: Secondary | ICD-10-CM

## 2018-05-04 DIAGNOSIS — R066 Hiccough: Secondary | ICD-10-CM | POA: Diagnosis present

## 2018-05-04 DIAGNOSIS — A419 Sepsis, unspecified organism: Principal | ICD-10-CM

## 2018-05-04 LAB — COMPREHENSIVE METABOLIC PANEL
ALBUMIN: 1.7 g/dL — AB (ref 3.5–5.0)
ALBUMIN: 1.8 g/dL — AB (ref 3.5–5.0)
ALT: 112 U/L — AB (ref 17–63)
ALT: 117 U/L — ABNORMAL HIGH (ref 17–63)
AST: 208 U/L — AB (ref 15–41)
AST: 207 U/L — ABNORMAL HIGH (ref 15–41)
Alkaline Phosphatase: 254 U/L — ABNORMAL HIGH (ref 38–126)
Alkaline Phosphatase: 259 U/L — ABNORMAL HIGH (ref 38–126)
Anion gap: 13 (ref 5–15)
Anion gap: 11 (ref 5–15)
BUN: 9 mg/dL (ref 6–20)
BUN: 9 mg/dL (ref 6–20)
CALCIUM: 7.8 mg/dL — AB (ref 8.9–10.3)
CHLORIDE: 96 mmol/L — AB (ref 101–111)
CO2: 23 mmol/L (ref 22–32)
CO2: 24 mmol/L (ref 22–32)
Calcium: 7.8 mg/dL — ABNORMAL LOW (ref 8.9–10.3)
Chloride: 94 mmol/L — ABNORMAL LOW (ref 101–111)
Creatinine, Ser: 0.7 mg/dL (ref 0.61–1.24)
Creatinine, Ser: 0.74 mg/dL (ref 0.61–1.24)
GFR calc Af Amer: >60 mL/min (ref >60)
GFR calc Af Amer: >60 mL/min (ref >60)
GFR calc non Af Amer: >60 mL/min (ref >60)
GLUCOSE: 90 mg/dL (ref 65–99)
Glucose, Bld: 97 mg/dL (ref 65–99)
POTASSIUM: 3.8 mmol/L (ref 3.5–5.1)
Potassium: 3.5 mmol/L (ref 3.5–5.1)
SODIUM: 130 mmol/L — AB (ref 135–145)
SODIUM: 131 mmol/L — AB (ref 135–145)
Total Bilirubin: 1.8 mg/dL — ABNORMAL HIGH (ref 0.3–1.2)
Total Bilirubin: 1.8 mg/dL — ABNORMAL HIGH (ref 0.3–1.2)
Total Protein: 4.9 g/dL — ABNORMAL LOW (ref 6.5–8.1)
Total Protein: 4.7 g/dL — ABNORMAL LOW (ref 6.5–8.1)

## 2018-05-04 LAB — CBC WITH DIFFERENTIAL/PLATELET
BASOS ABS: 0 10*3/uL (ref 0.0–0.1)
BLASTS: 0 %
Band Neutrophils: 49 %
Basophils Relative: 0 %
EOS ABS: 0 10*3/uL (ref 0.0–0.7)
Eosinophils Relative: 0 %
HCT: 39 % (ref 39.0–52.0)
HEMOGLOBIN: 12.7 g/dL — AB (ref 13.0–17.0)
Lymphocytes Relative: 1 %
Lymphs Abs: 0.1 10*3/uL — ABNORMAL LOW (ref 0.7–4.0)
MCH: 25.2 pg — ABNORMAL LOW (ref 26.0–34.0)
MCHC: 32.6 g/dL (ref 30.0–36.0)
MCV: 77.5 fL — ABNORMAL LOW (ref 78.0–100.0)
METAMYELOCYTES PCT: 8 %
MONOS PCT: 0 %
MYELOCYTES: 0 %
Monocytes Absolute: 0 10*3/uL — ABNORMAL LOW (ref 0.1–1.0)
NEUTROS ABS: 5.3 10*3/uL (ref 1.7–7.7)
Neutrophils Relative %: 42 %
Other: 0 %
PROMYELOCYTES RELATIVE: 0 %
Platelets: 49 10*3/uL — ABNORMAL LOW (ref 150–400)
RBC: 5.03 MIL/uL (ref 4.22–5.81)
RDW: 17.6 % — ABNORMAL HIGH (ref 11.5–15.5)
WBC MORPHOLOGY: INCREASED
WBC: 5.4 10*3/uL (ref 4.0–10.5)
nRBC: 0 /100 WBC

## 2018-05-04 LAB — GLUCOSE, CAPILLARY
GLUCOSE-CAPILLARY: 95 mg/dL (ref 65–99)
GLUCOSE-CAPILLARY: 120 mg/dL — AB (ref 65–99)
Glucose-Capillary: 92 mg/dL (ref 65–99)
Glucose-Capillary: 176 mg/dL — ABNORMAL HIGH (ref 65–99)
Glucose-Capillary: 25 mg/dL — CL (ref 65–99)
Glucose-Capillary: 101 mg/dL — ABNORMAL HIGH (ref 65–99)

## 2018-05-04 LAB — HEPATITIS C ANTIBODY: HCV Ab: <0.1 s/co ratio (ref 0.0–0.9)

## 2018-05-04 LAB — RAPID URINE DRUG SCREEN, HOSP PERFORMED
Amphetamines: NOT DETECTED
BARBITURATES: NOT DETECTED
BENZODIAZEPINES: NOT DETECTED
Cocaine: NOT DETECTED
Opiates: NOT DETECTED
TETRAHYDROCANNABINOL: POSITIVE — AB

## 2018-05-04 LAB — HEPATITIS B CORE ANTIBODY, TOTAL: Hep B Core Total Ab: NEGATIVE

## 2018-05-04 LAB — HEPATITIS B SURFACE ANTIGEN: HEP B S AG: NEGATIVE

## 2018-05-04 LAB — ACETAMINOPHEN LEVEL

## 2018-05-04 LAB — HEPATITIS B SURFACE ANTIBODY,QUALITATIVE: Hep B S Ab: NONREACTIVE

## 2018-05-04 MED ORDER — DEXTROSE 50 % IV SOLN
INTRAVENOUS | Status: AC
  Administered 2018-05-04: 17:00:00
  Filled 2018-05-04: qty 50

## 2018-05-04 MED ORDER — IBUPROFEN 600 MG PO TABS
600.0000 mg | ORAL_TABLET | Freq: Four times a day (QID) | ORAL | Status: DC | PRN
  Administered 2018-05-04 – 2018-05-13 (×4): 600 mg via ORAL
  Filled 2018-05-04 (×4): qty 1

## 2018-05-04 MED ORDER — SODIUM CHLORIDE 3 % IN NEBU
4.0000 mL | INHALATION_SOLUTION | Freq: Once | RESPIRATORY_TRACT | Status: AC
  Administered 2018-05-04: 4 mL via RESPIRATORY_TRACT
  Filled 2018-05-04: qty 4

## 2018-05-04 MED ORDER — ISONIAZID 300 MG PO TABS
300.0000 mg | ORAL_TABLET | Freq: Every day | ORAL | Status: DC
  Administered 2018-05-04 – 2018-05-15 (×12): 300 mg via ORAL
  Filled 2018-05-04 (×12): qty 1

## 2018-05-04 MED ORDER — SODIUM CHLORIDE 3 % IN NEBU: 4.0000 mL | INHALATION_SOLUTION | Freq: Once | RESPIRATORY_TRACT | Status: DC

## 2018-05-04 MED ORDER — ETHAMBUTOL HCL 400 MG PO TABS
1500.0000 mg | ORAL_TABLET | Freq: Every day | ORAL | Status: DC
  Administered 2018-05-04 – 2018-05-08 (×5): 1500 mg via ORAL
  Filled 2018-05-04 (×7): qty 3

## 2018-05-04 MED ORDER — RIFAMPIN 300 MG PO CAPS
600.0000 mg | ORAL_CAPSULE | Freq: Every day | ORAL | Status: DC
  Filled 2018-05-04: qty 2

## 2018-05-04 MED ORDER — PYRAZINAMIDE 500 MG PO TABS
2000.0000 mg | ORAL_TABLET | Freq: Every day | ORAL | Status: DC
  Administered 2018-05-04 – 2018-05-08 (×5): 2000 mg via ORAL
  Filled 2018-05-04 (×7): qty 4

## 2018-05-04 MED ORDER — CHLORPROMAZINE HCL 25 MG PO TABS
25.0000 mg | ORAL_TABLET | Freq: Three times a day (TID) | ORAL | Status: DC
  Administered 2018-05-04 – 2018-05-05 (×2): 25 mg via ORAL
  Filled 2018-05-04 (×4): qty 1

## 2018-05-04 MED ORDER — VITAMIN B-6 50 MG PO TABS
50.0000 mg | ORAL_TABLET | Freq: Every day | ORAL | Status: DC
  Administered 2018-05-04 – 2018-05-15 (×12): 50 mg via ORAL
  Filled 2018-05-04 (×13): qty 1

## 2018-05-04 MED ORDER — SODIUM CHLORIDE 3 % IN NEBU
4.0000 mL | INHALATION_SOLUTION | Freq: Once | RESPIRATORY_TRACT | Status: DC
  Filled 2018-05-04: qty 4

## 2018-05-04 MED ORDER — RIFAMPIN 300 MG PO CAPS
600.0000 mg | ORAL_CAPSULE | Freq: Every day | ORAL | Status: DC
  Administered 2018-05-04: 600 mg via ORAL
  Filled 2018-05-04 (×2): qty 2

## 2018-05-04 MED ORDER — IOPAMIDOL (ISOVUE-300) INJECTION 61%
INTRAVENOUS | Status: AC
  Administered 2018-05-04: 15:00:00
  Filled 2018-05-04: qty 30

## 2018-05-04 NOTE — Progress Notes (Signed)
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 Currently on broad coverage with vancomycin and Zosyn. Remains febrile. No leukocytosis, but bandemia (band neutrophils 49) which would not expect with TB. Transaminases also trending up.   AFB smear collected x2  HIV nonreactive  HCV total Ab, Hep B surface antigen and antibody, and Hep B core antibody all negative  Strep pneumo antigen negative   PLAN: 1. Bilateral, reticular pulmonary infiltrates with cavitary lesions on upper lobes: Highly suspicious for TB. Thus far 2 sputum samples collected for AFB smear. Last one due at 7pm today. Awaiting smear and culture results. Plan to start RIPE therapy today. Continue to monitor transaminases given hepatotoxic effects of therapy. He will also need a diet as no plan for bronchoscopy at this time. Will continue vancomycin and Zosyn as well for possible superimposed bacterial infection.   2. Bandemia: 49 band neutrophils on CBC with diff. Unclear etiology. Can be seen in the setting of severe infection/sepsis, but would not expect this with TB. ?Malignancy. Recommend blood smear for further evaluation.  3. Transamintis: Will continue to monitor given plan to start RIPE therapy.   Principal Problem:   Severe sepsis ([place]) Active Problems:   Diabetes mellitus without complication ([place])   Sarcoidosis   Hypertension   Cavitary pneumonia   Thrombocytopenia ([place])   Hyponatremia   LFTs abnormal   Scheduled Meds: . insulin aspart  0-5 Units Subcutaneous QHS   . insulin aspart  0-9 Units Subcutaneous TID WC  . metoprolol succinate  50 mg Oral Daily  . sodium chloride flush  3 mL Intravenous Q12H  . sodium chloride HYPERTONIC  4 mL Nebulization Daily  . sodium chloride HYPERTONIC  4 mL Nebulization Once   Continuous Infusions: . piperacillin-tazobactam (ZOSYN)  IV 3.375 g (05/04/18 1057)  . vancomycin 1,000 mg (05/04/18 1057)   PRN Meds:.bisacodyl, HYDROcodone-acetaminophen, ondansetron **OR** ondansetron (ZOFRAN) IV, senna-docusate   SUBJECTIVE: No acute events overnight. Patient continues to have high-grade fevers, chills, and diaphoresis but overall feeling better than yesterday. Discussed importance of sputum sample.   Review of Systems: Review of Systems  Constitutional: Positive for chills, diaphoresis, fever and malaise/fatigue.  Respiratory: Positive for cough and sputum production. Negative for hemoptysis and shortness of breath.   Cardiovascular: Negative for chest pain and leg swelling.  Gastrointestinal: Negative for abdominal pain, diarrhea, nausea and vomiting.  Musculoskeletal: Negative for myalgias.  Neurological: Positive for weakness.    No Known Allergies  OBJECTIVE: Vitals:   05/03/18 2346 05/04/18 0507 05/04/18 0927 05/04/18 0945  BP: 101/69 101/72 100/74   Pulse: (!) 114 (!) 102 (!) 107   Resp: (!) 30 20 18    Temp: 99.4 F (37.4 C) (!) 100.6 F (38.1 C) (!) 101.3 F (38.5 C)   TempSrc: Oral Oral Oral   SpO2: 95% 95% 93% 93%  Weight:      Height:       Body mass index is 21.31 kg/m.  Physical Exam  Constitutional: He  is oriented to person, place, and time.  Elderly male who appears tired and ill. Thin and well-developed   Cardiovascular: Regular rhythm and normal heart sounds. Tachycardia present. Exam reveals no gallop and no friction rub.  No murmur heard. Pulmonary/Chest: Effort normal and breath sounds normal. No respiratory distress. He has no wheezes. He has no rales.  Abdominal: Soft. Bowel  sounds are normal. There is no tenderness.  Musculoskeletal: He exhibits no edema.  Neurological: He is alert and oriented to person, place, and time.  Skin: Skin is warm.    Lab Results Lab Results  Component Value Date   WBC 5.4 05/04/2018   HGB 12.7 (L) 05/04/2018   HCT 39.0 05/04/2018   MCV 77.5 (L) 05/04/2018   PLT 49 (L) 05/04/2018    Lab Results  Component Value Date   CREATININE 0.74 05/04/2018   BUN 9 05/04/2018   NA 131 (L) 05/04/2018   K 3.8 05/04/2018   CL 96 (L) 05/04/2018   CO2 24 05/04/2018    Lab Results  Component Value Date   ALT 117 (H) 05/04/2018   AST 207 (H) 05/04/2018   ALKPHOS 259 (H) 05/04/2018   BILITOT 1.8 (H) 05/04/2018     Microbiology: Recent Results (from the past 240 hour(s))  Culture, respiratory (NON-Expectorated)     Status: None (Preliminary result)   Collection Time: 05/03/18 10:39 PM  Result Value Ref Range Status   Specimen Description SPUTUM  Final   Special Requests NONE  Final   Gram Stain   Final    NO WBC SEEN FEW GRAM VARIABLE ROD FEW GRAM POSITIVE COCCI IN PAIRS RARE YEAST Performed at [facility], [place] [address] [place], [place] [address]    Culture PENDING  Incomplete   Report Status PENDING  Incomplete    Welford Roche, [facility] [place] [phone] pager   [phone] cell 05/04/2018, 11:06 AM

## 2018-05-04 NOTE — Progress Notes (Signed)
Hypertonic saline given at 09:45. Patient is currently unable to produce a sputum sample. RT explained the importance of obtaining a sputum sample & asked patient to cough. Patient has a very weak cough & wasn't putting forth good effort. Sputum cup is on the bedside table within patient's reach. Patient is aware to spit into sputum cup and call for it to be collected if he's able to cough anything up. RN is aware. One time order placed for Hypertonic neb at 14:00.

## 2018-05-04 NOTE — Progress Notes (Signed)
MD made aware of change in V/S

## 2018-05-04 NOTE — Progress Notes (Addendum)
PROGRESS NOTE    Patient: Jeffrey Hayes     PCP: [name], MD                    DOB: [birth year]            DOA: 05/02/2018 QHU:[id number]             DOS: 05/04/2018, 11:38 AM   LOS: 2 days   Date of Service: The patient was seen and examined on 05/04/2018  Subjective:  Patient was seen and examined this morning, still febrile, temp of 103.4, tachycardic 102, tachypneic respiratory rate is 26, blood pressure improved from 91/66 to 109/73, satting 88% room air, 100% on 2 L of oxygen  Pulmonary critical team Dr. Edison Nasuti has been consulted, anticipating urgent evaluation, possible bronchoscopy. We will keep the patient n.p.o. For now   ----------------------------------------------------------------------------------------------------------------------  Brief Narrative:   JA OHMAN is a 67 y.o. male with medical history significant for hypertension, recent diagnosis of diabetes, and sarcoidosis with intra-abdominal manifestations status post recent course of prednisone, now presenting to the emergency department for evaluation of poor appetite with weight loss, palpitations, and exertional dyspnea over the past 3 weeks.  Patient reports abdominal swelling a few months ago that was attributed to sarcoidosis by his rheumatologist and managed with paracentesis and a course of prednisone.  Patient reports that his abdominal symptoms have since resolved and he was in his usual state until the insidious development of anorexia, malaise, and exertional dyspnea.  He reports sputum production but denies hemoptysis.  He was briefly incarcerated decades ago and also volunteered in a homeless shelter remotely.  Does not know of any close contacts with tuberculosis.  CTA of the chest revealed no PE, bilateral infiltrate, upper lobe cavitary lesion consistent with infection/infiltrate Cultures were obtained, aggressive IV fluid has been initiated with IV antibiotics  Continue to be febrile, with  lactic acid of 2.7, no leukocytosis, hypotensive, tachycardic, Infectious disease and pulmonary was consulted    Principal Problem:   Severe sepsis ([place]) Active Problems:   Cavitary pneumonia   Diabetes mellitus without complication ([place])   Sarcoidosis   Hypertension   Thrombocytopenia ([facility])   Hyponatremia   LFTs abnormal   Assessment & Plan:    Sepsis / cavitary lesion/bilateral pneumonia  - CTA of the chest revealed no PE, bilateral infiltrate, upper lobe cavitary lesion consistent with infection/infiltrate -Still febrile this morning, temp 101.3, tachycardic at 107, respiratory rate 18 blood pressure improved to 100/74 O2 sat 93% on 2 L of oxygen -Improved lactic acid, leukocytosis -Respiratory assist to obtain sputum for culture -We will follow with blood and sputum cultures accordingly, continue IV antibiotics of Zosyn and vancomycin,  -Initiate infectious disease and pulmonary team Dr. Nelda Marseille following  -Per history has been malaise for the past 3 weeks, weight loss over 45 pounds  - He has hx of incarceration and volunteered in homeless shelter, both decades ago, and recently immunosuppressed with prednisone  - Airborne precautions, culture blood and sputum, AFB sputum cultures, pending x3   -No immediate plan for bronchoscopy   Hypoxemia/mild respiratory distress -Currently satting greater than 92% on 2 L of oxygen, will continue to monitor, PRN DuoNeb bronchodilators   Sarcoidosis  -Stable, follow with rheumatology and reports recent intra-abdominal manifestations are clinically resolved with a course of prednisone, no maintenance dose    Hyponatremia  -On admission sodium is 126 -->131 -In the setting of hypovolemia, will continue IV fluid hydration -We  will monitor sodium level closely  Thrombocytopenia  - On admission platelets was 92, currently 64---> 49 in a setting of sepsis and sarcoidosis -Stable no signs of bleeding -Monitoring very closely    Elevated LFT's  - Mild elevations in alk phos, transaminases, and bilirubin noted  - No acute abdominal findings on exam -Hepatitis panel negative with exception of hepatitis C < 0.1, - Pt reports ascites a few months ago s/p paracentesis, attributed to sarcoidosis by his rheumatologist, and status-post prednisone taper   - Trend LFT's, follow clinically  -We will obtain a CT of abdomen  Type II DM  - No A1c on file, recent diagnosis and possibly steroid-induced  - Follow CBG's and use a low-intensity SSI with Novolog as needed    Intractable hiccups -We will initiate Thorazine -Monitor closely   DVT prophylaxis: SCD's Code Status: Full  Family Communication: Significant other updated at bedside Consults called:  Infectious disease team/pulmonologist, critical care team Dr. Edison Nasuti Admission status: Inpatient    Procedures:    Dissipating bronchoscopy  Antimicrobials:  Anti-infectives (From admission, onward)   Start     Dose/Rate Route Frequency Ordered Stop   05/03/18 1000  vancomycin (VANCOCIN) IVPB 1000 mg/200 mL premix     1,000 mg 200 mL/hr over 60 Minutes Intravenous Every 12 hours 05/02/18 2020     05/03/18 0200  piperacillin-tazobactam (ZOSYN) IVPB 3.375 g     3.375 g 12.5 mL/hr over 240 Minutes Intravenous Every 8 hours 05/02/18 2020     05/02/18 2030  piperacillin-tazobactam (ZOSYN) IVPB 3.375 g     3.375 g 100 mL/hr over 30 Minutes Intravenous  Once 05/02/18 2017 05/02/18 2335   05/02/18 2030  vancomycin (VANCOCIN) 1,500 mg in sodium chloride 0.9 % 500 mL IVPB     1,500 mg 250 mL/hr over 120 Minutes Intravenous  Once 05/02/18 2017 05/02/18 2330   05/02/18 1830  cefTRIAXone (ROCEPHIN) 1 g in sodium chloride 0.9 % 100 mL IVPB  Status:  Discontinued     1 g 200 mL/hr over 30 Minutes Intravenous  Once 05/02/18 1828 05/02/18 2015   05/02/18 1830  azithromycin (ZITHROMAX) tablet 500 mg     500 mg Oral  Once 05/02/18 1828 05/02/18 1947         Objective: Vitals:   05/03/18 2346 05/04/18 0507 05/04/18 0927 05/04/18 0945  BP: 101/69 101/72 100/74   Pulse: (!) 114 (!) 102 (!) 107   Resp: (!) _0 Temp: 99.4 F (37.4 C) (!) 100.6 F (38.1 C) (!) 101.3 F (38.5 C)   TempSrc: Oral Oral Oral   SpO2: 95% 95% 93% 93%  Weight:      Height:        Intake/Output Summary (Last 24 hours) at 05/04/2018 1138 Last data filed at 05/04/2018 0900 Gross per 24 hour  Intake 500 ml  Output 0 ml  Net 500 ml   Filed Weights   05/02/18 2000  Weight: 75.3 kg (166 lb 0.1 oz)   BP 100/74 (BP Location: Left Arm)   Pulse (!) 107   Temp (!) 101.3 F (38.5 C) (Oral)   Resp 18   Ht _1  (1.88 m)   Wt 75.3 kg (166 lb 0.1 oz)   SpO2 93%   BMI 21.31 kg/m    Physical Exam  Constitution:  Alert, cooperative, no distress, use cachexia Psychiatric: Normal and stable mood and affect, cognition intact,   HEENT: Normocephalic, PERRL, otherwise with in Normal limits  Cardio vascular:  S1/S2, RRR, No murmure, No Rubs or Gallops  Chest/pulmonary: Positive breath sounds, positive for rhonchi and rubs in upper lobes Abdomen: Soft, non-tender, non-distended, bowel sounds,no masses, no organomegaly Muscular skeletal: Limited exam - in bed, able to move all 4 extremities, Normal strength,  Neuro: CNII-XII intact. , normal motor and sensation, reflexes intact  Extremities: No pitting edema lower extremities, +2 pulses  Skin: Dry, warm to touch, negative for any Rashes, No open wounds     Data Reviewed: I have personally reviewed following labs and imaging studies  CBC: Recent Labs  Lab 05/02/18 1051 05/03/18 0056 05/04/18 0443  WBC 8.0 5.8 5.4  NEUTROABS  --  5.6 5.3  HGB 14.3 11.3* 12.7*  HCT 43.4 35.4* 39.0  MCV 76.8* 79.2 77.5*  PLT 92* 64* 49*   Basic Metabolic Panel: Recent Labs  Lab 05/02/18 1051 05/04/18 0443 05/04/18 0915  NA 126* 130* 131*  K 4.0 3.5 3.8  CL 88* 94* 96*  CO2 _0 GLUCOSE 166* 90 97  BUN _1 CREATININE 0.99 0.70 0.74  CALCIUM 8.8* 7.8* 7.8*   GFR: Estimated Creatinine Clearance: 96.7 mL/min (by C-G formula based on SCr of 0.74 mg/dL). Liver Function Tests: Recent Labs  Lab 05/02/18 1500 05/04/18 0443 05/04/18 0915  AST 129* 208* 207*  ALT 90* 112* 117*  ALKPHOS 158* 254* 259*  BILITOT 1.4* 1.8* 1.8*  PROT 5.7* 4.9* 4.7*  ALBUMIN 2.2* 1.7* 1.8*   CBG: Recent Labs  Lab 05/03/18 1544 05/03/18 2126 05/03/18 2351 05/04/18 0404 05/04/18 0744  GLUCAP 83 88 118* 92 95   LThyroid Function Tests: Recent Labs    05/02/18 1500  TSH 2.416  FREET4 0.91  Sepsis Labs: Recent Labs  Lab 05/02/18 1516 05/02/18 1756 05/03/18 0056  LATICACIDVEN 2.27* 2.04* 1.0    Recent Results (from the past 240 hour(s))  Culture, respiratory (NON-Expectorated)     Status: None (Preliminary result)   Collection Time: 05/03/18 10:39 PM  Result Value Ref Range Status   Specimen Description SPUTUM  Final   Special Requests NONE  Final   Gram Stain   Final    NO WBC SEEN FEW GRAM VARIABLE ROD FEW GRAM POSITIVE COCCI IN PAIRS RARE YEAST Performed at [facility], [place] [address] [place], [place] [address]    Culture PENDING  Incomplete   Report Status PENDING  Incomplete      Radiology Studies: Dg Chest 2 View  Result Date: 05/02/2018 CLINICAL DATA:  67 year old male with cirrhosis. Weakness and loss of appetite. EXAM: CHEST - 2 VIEW COMPARISON:  Chest CT 08/25/2017, radiographs 06/25/2016. FINDINGS: Semi upright AP and lateral views of the chest. Diffuse reticulonodular pulmonary opacity with an upper lobe predominance, and some semi confluent areas of upper lobe involvement. No superimposed pneumothorax or pleural effusion. Mediastinal contours remain normal. Visualized tracheal air column is within normal limits. Chronic postoperative changes to the left scapula. No acute osseous abnormality identified. Negative visible bowel gas pattern. IMPRESSION: Diffuse  pulmonary reticulonodular opacity with some mildly confluent areas in the bilateral upper lobes. No pleural effusion. The appearance is nonspecific. Favor acute viral/atypical respiratory infection. Characterization with Chest CT (noncontrast probably would suffice) may be valuable. Electronically Signed   By: Genevie Ann M.D.   On: 05/02/2018 16:30   Ct Angio Chest Pe W And/or Wo Contrast  Result Date: 05/02/2018 CLINICAL DATA:  Three weeks of decreased appetite and generalized weakness. Shortness of breath  with exertion. No fever. EXAM: CT ANGIOGRAPHY CHEST WITH CONTRAST TECHNIQUE: Multidetector CT imaging of the chest was performed using the standard protocol during bolus administration of intravenous contrast. Multiplanar CT image reconstructions and MIPs were obtained to evaluate the vascular anatomy. CONTRAST:  132m ISOVUE-370 IOPAMIDOL (ISOVUE-370) INJECTION 76% COMPARISON:  Chest x-ray May 02, 2018.  Chest CT September 02, 2017 FINDINGS: Cardiovascular: The thoracic aorta demonstrates atherosclerotic change with no aneurysm or dissection. Stairstep artifact is seen, particularly in the lung bases, limiting evaluation for pulmonary emboli. Taking the artifact into account, there is no convincing evidence of pulmonary emboli. Mediastinum/Nodes: The thyroid is normal. There is a prominent subcarinal node which measures 18 mm and is stable. Shotty nodes in the AP window are stable. There is a prominent right hilar node which was not well assessed on the previous unenhanced study. Lungs/Pleura: Central airways are normal. No pneumothorax. Bilateral pulmonary infiltrates consisting of both numerous small nodules and more confluent regions. The infiltrates are most prominent in the upper lobes with cavitary rounded regions as seen on coronal image 74 measuring up to 3.6 cm in the right upper lobe, coronal image 67 in the left apex measuring up to 1.9 cm, and coronal image 71 in the left upper lobe measuring up to  1.9 cm. The right middle lobe and lower lobes are involve as well but less severely. No other acute abnormalities identified within the lung parenchyma. Upper Abdomen: Thickening of the right adrenal gland is stable, likely hyperplasia. Increased attenuation in the fat of the omentum within the left upper quadrant is new and nonspecific. Probable minimal ascites. Musculoskeletal: No chest wall abnormality. No acute or significant osseous findings. Review of the MIP images confirms the above findings. IMPRESSION: 1. Bilateral pulmonary infiltrates most prominent in the upper lobes with rounded regions of cavitation in the bilateral apices. Findings are most consistent with an infectious or inflammatory process. Atypical infections such as tuberculosis should be considered. Non tuberculous mycobacterial infections and fungal infections are also possible. Malignancy is considered less likely. Inflammatory causes such as sarcoidosis could also present with upper lobe cavitating nodules. There are a few scattered prominent nodes in the mediastinum which are stable. There is a prominent right hilar node which was not well assessed on previous unenhanced imaging. 2. Atherosclerotic change in the thoracic aorta. 3. Evaluation for pulmonary emboli is limited due to artifact but no emboli are identified. Findings called to the patient's ER doctor, Dr. PMaryan Rued Aortic Atherosclerosis (ICD10-I70.0). Electronically Signed   By: DDorise BullionIII M.D   On: 05/02/2018 18:02    Scheduled Meds: . insulin aspart  0-5 Units Subcutaneous QHS  . insulin aspart  0-9 Units Subcutaneous TID WC  . metoprolol succinate  50 mg Oral Daily  . sodium chloride flush  3 mL Intravenous Q12H  . sodium chloride HYPERTONIC  4 mL Nebulization Daily  . sodium chloride HYPERTONIC  4 mL Nebulization Once   Continuous Infusions: . piperacillin-tazobactam (ZOSYN)  IV 3.375 g (05/04/18 1057)  . vancomycin 1,000 mg (05/04/18 1057)    Time  spent: >35 minutes  SDeatra James MD [facility],  Pager [phone] If 7PM-7AM, please contact night-coverage [email or web address]   Password [email or web address] 05/04/2018, 11:38 AM

## 2018-05-04 NOTE — Progress Notes (Signed)
CBG 25, amp of d50 given, CBG rechecked: 176. Patient was drowsy but oriented.

## 2018-05-04 NOTE — Progress Notes (Signed)
New Admission Note:  Arrival Method: Bed Mental Orientation: Alert and oriented x 4 Telemetry: N/A Assessment: Completed Skin: Warm and dry.  IV: NSL X2 Pain: Denies Tubes: N/A Safety Measures: Safety Fall Prevention Plan initiated.  Admission: Completed 5 M  Orientation: Patient has been orientated to the room, unit and the staff. Family: None  Orders have been reviewed and implemented. Will continue to monitor the patient. Call light has been placed within reach and bed alarm has been activated.   Sima Matas BSN, RN  Phone Number: [phone]

## 2018-05-04 NOTE — Progress Notes (Signed)
Name: Jeffrey Hayes MRN: [id number] DOB: July 15, 1951    ADMISSION DATE:  05/02/2018 CONSULTATION DATE: 05/03/2018  REFERRING MD : [facility]  CHIEF COMPLAINT: Exertional dyspnea  BRIEF PATIENT DESCRIPTION: 67 year old with progressive dyspnea  SIGNIFICANT EVENTS    STUDIES:  529 CT scan is noted   HISTORY OF PRESENT ILLNESS:   Jeffrey Hayes is a 67 year old male who has a past medical history that is notable for diabetes, sarcoidosis which was diagnosed via biopsy, history of cirrhosis with recent paracentesis of 4 L of clear fluid obtained.  He presents with development of weight loss loss of appetite and exertional dyspnea.  He does report coughing up sputum but denies hemoptysis.  He was evaluated by [facility] hospitalist CT scan was performed which shows extensive cavitary pneumonia and infiltrates.  Pulmonary critical care has been consulted to evaluate.  Radiographic findings make tuberculosis strong candidate.  He will have sputum was induced for AFB but this is not sufficient he will may require bronchoscopy in the future.  Note that he has been evaluated for HIV.  SUBJECTIVE:  Feels better this AM  VITAL SIGNS: Temp:  [99 F (37.2 C)-103.2 F (39.6 C)] 100.6 F (38.1 C) (05/30 0507) Pulse Rate:  [88-125] 102 (05/30 0507) Resp:  [16-30] 20 (05/30 0507) BP: (90-118)/(61-75) 101/72 (05/30 0507) SpO2:  [88 %-99 %] 95 % (05/30 0507)  PHYSICAL EXAMINATION: General:  Chronically ill appearing male, NAD Neuro:  Alert and oriented, moving all ext to command HEENT:  [place]/AT, PERRL, EOM-I and MMM Cardiovascular:  RRR, Nl S1/S2 and -M/R/G Lungs:  Distant BS diffusely Abdomen:  Soft, NT, ND and +BS Musculoskeletal:  -edema and -tenderness Skin:  Intact  Recent Labs  Lab 05/02/18 1051 05/04/18 0443  NA 126* 130*  K 4.0 3.5  CL 88* 94*  CO2 26 23  BUN 11 9  CREATININE 0.99 0.70  GLUCOSE 166* 90   Recent Labs  Lab 05/02/18 1051 05/03/18 0056 05/04/18 0443  HGB 14.3 11.3*  12.7*  HCT 43.4 35.4* 39.0  WBC 8.0 5.8 5.4  PLT 92* 64* 49*   Dg Chest 2 View  Result Date: 05/02/2018 CLINICAL DATA:  67 year old male with cirrhosis. Weakness and loss of appetite. EXAM: CHEST - 2 VIEW COMPARISON:  Chest CT 08/25/2017, radiographs 06/25/2016. FINDINGS: Semi upright AP and lateral views of the chest. Diffuse reticulonodular pulmonary opacity with an upper lobe predominance, and some semi confluent areas of upper lobe involvement. No superimposed pneumothorax or pleural effusion. Mediastinal contours remain normal. Visualized tracheal air column is within normal limits. Chronic postoperative changes to the left scapula. No acute osseous abnormality identified. Negative visible bowel gas pattern. IMPRESSION: Diffuse pulmonary reticulonodular opacity with some mildly confluent areas in the bilateral upper lobes. No pleural effusion. The appearance is nonspecific. Favor acute viral/atypical respiratory infection. Characterization with Chest CT (noncontrast probably would suffice) may be valuable. Electronically Signed   By: Genevie Ann M.D.   On: 05/02/2018 16:30   Ct Angio Chest Pe W And/or Wo Contrast  Result Date: 05/02/2018 CLINICAL DATA:  Three weeks of decreased appetite and generalized weakness. Shortness of breath with exertion. No fever. EXAM: CT ANGIOGRAPHY CHEST WITH CONTRAST TECHNIQUE: Multidetector CT imaging of the chest was performed using the standard protocol during bolus administration of intravenous contrast. Multiplanar CT image reconstructions and MIPs were obtained to evaluate the vascular anatomy. CONTRAST:  133mL ISOVUE-370 IOPAMIDOL (ISOVUE-370) INJECTION 76% COMPARISON:  Chest x-ray May 02, 2018.  Chest CT September 02, 2017 FINDINGS:  Cardiovascular: The thoracic aorta demonstrates atherosclerotic change with no aneurysm or dissection. Stairstep artifact is seen, particularly in the lung bases, limiting evaluation for pulmonary emboli. Taking the artifact into account,  there is no convincing evidence of pulmonary emboli. Mediastinum/Nodes: The thyroid is normal. There is a prominent subcarinal node which measures 18 mm and is stable. Shotty nodes in the AP window are stable. There is a prominent right hilar node which was not well assessed on the previous unenhanced study. Lungs/Pleura: Central airways are normal. No pneumothorax. Bilateral pulmonary infiltrates consisting of both numerous small nodules and more confluent regions. The infiltrates are most prominent in the upper lobes with cavitary rounded regions as seen on coronal image 74 measuring up to 3.6 cm in the right upper lobe, coronal image 67 in the left apex measuring up to 1.9 cm, and coronal image 71 in the left upper lobe measuring up to 1.9 cm. The right middle lobe and lower lobes are involve as well but less severely. No other acute abnormalities identified within the lung parenchyma. Upper Abdomen: Thickening of the right adrenal gland is stable, likely hyperplasia. Increased attenuation in the fat of the omentum within the left upper quadrant is new and nonspecific. Probable minimal ascites. Musculoskeletal: No chest wall abnormality. No acute or significant osseous findings. Review of the MIP images confirms the above findings. IMPRESSION: 1. Bilateral pulmonary infiltrates most prominent in the upper lobes with rounded regions of cavitation in the bilateral apices. Findings are most consistent with an infectious or inflammatory process. Atypical infections such as tuberculosis should be considered. Non tuberculous mycobacterial infections and fungal infections are also possible. Malignancy is considered less likely. Inflammatory causes such as sarcoidosis could also present with upper lobe cavitating nodules. There are a few scattered prominent nodes in the mediastinum which are stable. There is a prominent right hilar node which was not well assessed on previous unenhanced imaging. 2. Atherosclerotic change  in the thoracic aorta. 3. Evaluation for pulmonary emboli is limited due to artifact but no emboli are identified. Findings called to the patient's ER doctor, Dr. Maryan Rued. Aortic Atherosclerosis (ICD10-I70.0). Electronically Signed   By: Dorise Bullion III M.D   On: 05/02/2018 18:02    I reviewed chest CT myself, cavitary lesions noted and infiltrate noted  ASSESSMENT Principal Problem:   Cavitary pneumonia Active Problems:   Diabetes mellitus without complication ([place])   Sarcoidosis   Hypertension   Thrombocytopenia ([place])   Hyponatremia   LFTs abnormal  Cirrhosis    Discussion: 67 year old male with previous history of incarceration and doing community service in homeless shelter, as well as being a reformed alcoholic with cirrhosis and smoker who presents to the hospital with SOB, fever, lethargy and weight loss.  Patient was in his normal state of health until 4 months ago when he was diagnosed with sarcoidosis through an omental biopsy during his evaluation for liver failure.  Patient was started on 40 mg of prednisone daily for 1 month then dropped to 35 mg PO daily x1 month then 30 mg daily.  He stopped taking his prednisone 2 wks ago after felling unwell as above.  Also reports fever x2 months, SOB and wt loss of 37 lbs in 4 months.  No hemoptysis or contact that he recalls with TB.  Discussed with PCCM-NP.  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 steroids  - Quantiferon gold, collected and pending  - HIV, negative  - Continue negative pressure room and isolation  - Sputum  cultures pending  - Appreciate input from ID, will defer starting treatment of MTB to ID  - F/U on AFB, one collected, can collect up to 8 hours apart  - Spoke with RT, will collect 3 samples 8 hours apart  Cavitary lesions: TB vs cavitary PNA vs cancer  - If AFB is negative will need bronch, high chance of TB in this case  - If findings persist post treatment then will need bronchoscopy and BAL  HCAP:  -  F/U on cultures  - Vanc  - Zosyn  - Tailor when cultures are available  Hypoxemia  - Titrate O2 for sat of 88-92%  PCCM will follow  Rush Farmer, M.D. [facility] Pulmonary/Critical Care Medicine. Pager: [phone]. After hours pager: [phone]

## 2018-05-05 ENCOUNTER — Inpatient Hospital Stay (HOSPITAL_COMMUNITY): Payer: Medicare Other

## 2018-05-05 LAB — COMPREHENSIVE METABOLIC PANEL
ALBUMIN: 1.6 g/dL — AB (ref 3.5–5.0)
ALT: 108 U/L — ABNORMAL HIGH (ref 17–63)
ANION GAP: 10 (ref 5–15)
AST: 205 U/L — ABNORMAL HIGH (ref 15–41)
Alkaline Phosphatase: 243 U/L — ABNORMAL HIGH (ref 38–126)
BUN: 14 mg/dL (ref 6–20)
CO2: 24 mmol/L (ref 22–32)
Calcium: 7.5 mg/dL — ABNORMAL LOW (ref 8.9–10.3)
Chloride: 90 mmol/L — ABNORMAL LOW (ref 101–111)
Creatinine, Ser: 0.96 mg/dL (ref 0.61–1.24)
GFR calc Af Amer: >60 mL/min (ref >60)
GLUCOSE: 79 mg/dL (ref 65–99)
POTASSIUM: 3.1 mmol/L — AB (ref 3.5–5.1)
Sodium: 124 mmol/L — ABNORMAL LOW (ref 135–145)
TOTAL PROTEIN: 4.4 g/dL — AB (ref 6.5–8.1)
Total Bilirubin: 3.4 mg/dL — ABNORMAL HIGH (ref 0.3–1.2)

## 2018-05-05 LAB — MAGNESIUM: Magnesium: 1.8 mg/dL (ref 1.7–2.4)

## 2018-05-05 LAB — NA AND K (SODIUM & POTASSIUM), RAND UR: POTASSIUM UR: 43 mmol/L

## 2018-05-05 LAB — CBC WITH DIFFERENTIAL/PLATELET
BASOS PCT: 1 %
Basophils Absolute: 0 10*3/uL (ref 0.0–0.1)
EOS ABS: 0 10*3/uL (ref 0.0–0.7)
Eosinophils Relative: 0 %
HCT: 39.6 % (ref 39.0–52.0)
HEMOGLOBIN: 12.9 g/dL — AB (ref 13.0–17.0)
LYMPHS PCT: 2 %
Lymphs Abs: 0.1 10*3/uL — ABNORMAL LOW (ref 0.7–4.0)
MCH: 25.8 pg — AB (ref 26.0–34.0)
MCHC: 32.6 g/dL (ref 30.0–36.0)
MCV: 79.2 fL (ref 78.0–100.0)
Monocytes Absolute: 0.1 10*3/uL (ref 0.1–1.0)
Monocytes Relative: 2 %
NEUTROS ABS: 3.9 10*3/uL (ref 1.7–7.7)
NEUTROS PCT: 95 %
PLATELETS: 44 10*3/uL — AB (ref 150–400)
RBC: 5 MIL/uL (ref 4.22–5.81)
RDW: 17.7 % — ABNORMAL HIGH (ref 11.5–15.5)
WBC: 4.1 10*3/uL (ref 4.0–10.5)

## 2018-05-05 LAB — QUANTIFERON-TB GOLD PLUS (RQFGPL)
QUANTIFERON MITOGEN VALUE: 7 [IU]/mL
QuantiFERON Nil Value: 6.07 IU/mL
QuantiFERON TB1 Ag Value: 7.33 IU/mL
QuantiFERON TB2 Ag Value: 6.82 IU/mL

## 2018-05-05 LAB — ACID FAST SMEAR (AFB, MYCOBACTERIA)

## 2018-05-05 LAB — HCV RNA QUANT: HCV Quantitative: NOT DETECTED IU/mL (ref >50)

## 2018-05-05 LAB — ACID FAST SMEAR (AFB)
ACID FAST SMEAR - AFSCU2: NEGATIVE
ACID FAST SMEAR - AFSCU2: NEGATIVE

## 2018-05-05 LAB — QUANTIFERON-TB GOLD PLUS: QUANTIFERON-TB GOLD PLUS: NEGATIVE

## 2018-05-05 LAB — OSMOLALITY: OSMOLALITY: 267 mosm/kg — AB (ref 275–295)

## 2018-05-05 LAB — GLUCOSE, CAPILLARY
GLUCOSE-CAPILLARY: 83 mg/dL (ref 65–99)
GLUCOSE-CAPILLARY: 83 mg/dL (ref 65–99)
Glucose-Capillary: 75 mg/dL (ref 65–99)
Glucose-Capillary: 104 mg/dL — ABNORMAL HIGH (ref 65–99)

## 2018-05-05 LAB — LEGIONELLA PNEUMOPHILA SEROGP 1 UR AG: L. PNEUMOPHILA SEROGP 1 UR AG: NEGATIVE

## 2018-05-05 LAB — OSMOLALITY, URINE: OSMOLALITY UR: 547 mosm/kg (ref 300–900)

## 2018-05-05 MED ORDER — POTASSIUM CHLORIDE CRYS ER 20 MEQ PO TBCR
40.0000 meq | EXTENDED_RELEASE_TABLET | Freq: Once | ORAL | Status: AC
  Administered 2018-05-05: 40 meq via ORAL
  Filled 2018-05-05: qty 2

## 2018-05-05 MED ORDER — SODIUM CHLORIDE 0.9 % IV SOLN
INTRAVENOUS | Status: DC
  Administered 2018-05-05 – 2018-05-08 (×4): via INTRAVENOUS

## 2018-05-05 MED ORDER — SODIUM CHLORIDE 1 G PO TABS
1.0000 g | ORAL_TABLET | Freq: Two times a day (BID) | ORAL | Status: DC
  Administered 2018-05-05 – 2018-05-08 (×7): 1 g via ORAL
  Filled 2018-05-05 (×8): qty 1

## 2018-05-05 MED ORDER — LEVOFLOXACIN 750 MG PO TABS
750.0000 mg | ORAL_TABLET | Freq: Every day | ORAL | Status: DC
  Administered 2018-05-05 – 2018-05-06 (×2): 750 mg via ORAL
  Filled 2018-05-05 (×2): qty 1

## 2018-05-05 MED ORDER — IOHEXOL 300 MG/ML  SOLN
100.0000 mL | Freq: Once | INTRAMUSCULAR | Status: AC | PRN
  Administered 2018-05-05: 100 mL via INTRAVENOUS

## 2018-05-05 MED ORDER — IOPAMIDOL (ISOVUE-300) INJECTION 61%
INTRAVENOUS | Status: AC
  Administered 2018-05-05: 01:00:00
  Filled 2018-05-05: qty 30

## 2018-05-05 MED ORDER — CHLORPROMAZINE HCL 50 MG PO TABS
50.0000 mg | ORAL_TABLET | Freq: Three times a day (TID) | ORAL | Status: DC
  Administered 2018-05-05 – 2018-05-06 (×3): 50 mg via ORAL
  Filled 2018-05-05 (×4): qty 1

## 2018-05-05 NOTE — Progress Notes (Signed)
Pharmacy Antibiotic Note  Jeffrey Hayes is a 67 y.o. male admitted on 05/02/2018 with pneumonia and concern for TB. Pharmacy has been consulted for Vancomycin and Zosyn dosing.   Today is D#4 of Vancomycin and Zosyn. Noted ID on board for work-up for TB - empiric treatment started on 5/30 evening. SCr 0.96, CrCl~80 ml/min - current antibiotic dosing remains appropriate. Will need to watch weights for adjustments with RIPE regimen.   Plan: - Continue Vancomycin 1gm IV Q12H - Continue Zosyn 3.375gm IV Q8H (4 hr inf) - Will continue to follow renal function, culture results, LOT, and antibiotic de-escalation plans   Height: 6\' 2"  (188 cm) Weight: 166 lb 0.1 oz (75.3 kg) IBW/kg (Calculated) : 82.2  Temp (24hrs), Avg:99.2 F (37.3 C), Min:97.2 F (36.2 C), Max:103.2 F (39.6 C)  Recent Labs  Lab 05/02/18 1051 05/02/18 1516 05/02/18 1756 05/03/18 0056 05/04/18 0443 05/04/18 0915 05/05/18 0602  WBC 8.0  --   --  5.8 5.4  --  4.1  CREATININE 0.99  --   --   --  0.70 0.74 0.96  LATICACIDVEN  --  2.27* 2.04* 1.0  --   --   --     Estimated Creatinine Clearance: 80.6 mL/min (by C-G formula based on SCr of 0.96 mg/dL).    No Known Allergies  Antimicrobials this admission: CTX x 1 5/28 Azithro x 1 5/28 Vanc 5/28>> Zosyn 5/28>> "RIPE" (TB) 5/30 >>  Dose adjustments this admission: n/a  Microbiology results: 5/28 Quantiferon-gold >> ip (still ip 5/31) 5/28 Strep PNA >> neg 5/28 Legionella >> ip 5/29 RCx >> 5/29 BCx >> ngx1d 5/29 AFB x 3  Thank you for allowing pharmacy to be a part of this patient's care.  Alycia Rossetti, PharmD, BCPS Clinical Pharmacist Pager: [phone] Clinical phone for 05/05/2018 from 7a-3:30p: [phone] If after 3:30p, please call main pharmacy at: [phone] 05/05/2018 10:59 AM

## 2018-05-05 NOTE — Care Management Note (Addendum)
Case Management Note  Patient Details  Name: Jeffrey Hayes MRN: [id number] Date of Birth: 18-Jul-1951  Subjective/Objective:   History of intra-abdominal sarcoidosis on prednisone x 3-4 months, alcoholic liver cirrhosis, recent paracentesis.  Admitted for Severe Sepsis/cavitary lesion/bilateral pneumonia.  PCP noted             Action/Plan: Pulmonary consult. CTA of the chest revealed no PE, bilateral infiltrate, upper lobe cavitary lesion consistent with infection/infiltrate.  Infectious Disease consulted-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 steroids- Quantiferon gold pending.  Prior to admission patient lived at home with wife.  At discharge patient plans to return to same living situation.  NCM will continue to follow for discharge transition needs  Expected Discharge Date:  05/08/18               Expected Discharge Plan:    home self care In-House Referral:   N/A  Discharge planning Services    CM consult Status of Service:   In progress, will continue to monitor.  Kristen Cardinal, RN 05/05/2018, 12:48 PM

## 2018-05-05 NOTE — Progress Notes (Signed)
PROGRESS NOTE    Patient: Jeffrey Hayes     PCP: [name], MD                    DOB: 31-Mar-1951            DOA: 05/02/2018 ION:[id number]             DOS: 05/05/2018, 12:49 PM   LOS: 3 days   Date of Service: The patient was seen and examined on 05/05/2018  Subjective:  Patient was seen and examined this morning, still febrile, temp of 103.4, tachycardic 102, tachypneic respiratory rate is 26, blood pressure improved from 91/66 to 109/73, satting 88% room air, 100% on 2 L of oxygen  Pulmonary critical team Dr. Edison Nasuti has been consulted, anticipating urgent evaluation, possible bronchoscopy. We will keep the patient n.p.o. For now   ----------------------------------------------------------------------------------------------------------------------  Brief Narrative:   Jeffrey Hayes is a 66 y.o. male with medical history significant for hypertension, recent diagnosis of diabetes, and sarcoidosis with intra-abdominal manifestations status post recent course of prednisone, now presenting to the emergency department for evaluation of poor appetite with weight loss, palpitations, and exertional dyspnea over the past 3 weeks.  Patient reports abdominal swelling a few months ago that was attributed to sarcoidosis by his rheumatologist and managed with paracentesis and a course of prednisone.  Patient reports that his abdominal symptoms have since resolved and he was in his usual state until the insidious development of anorexia, malaise, and exertional dyspnea.  He reports sputum production but denies hemoptysis.  He was briefly incarcerated decades ago and also volunteered in a homeless shelter remotely.  Does not know of any close contacts with tuberculosis.  CTA of the chest revealed no PE, bilateral infiltrate, upper lobe cavitary lesion consistent with infection/infiltrate Cultures were obtained, aggressive IV fluid has been initiated with IV antibiotics  Continue to be febrile, with  lactic acid of 2.7, no leukocytosis, hypotensive, tachycardic, Infectious disease and pulmonary was consulted    Principal Problem:   Severe sepsis ([place]) Active Problems:   Cavitary pneumonia   Diabetes mellitus without complication ([place])   Sarcoidosis   Hypertension   Thrombocytopenia ([place])   Hyponatremia   LFTs abnormal   Intractable hiccups   Assessment & Plan:    Sepsis / cavitary lesion/bilateral pneumonia (ruling out TB, cavitary pneumonia, ?  Cancer) - CTA of the chest revealed no PE, bilateral infiltrate, upper lobe cavitary lesion consistent with infection/infiltrate -T-max 103.2, currently 97.2, pulse 99, respiratory 22, blood pressure 91/61 -Improved lactic acid, leukocytosis  -Fllowing up with blood and sputum cultures accordingly, continue IV antibiotics of Zosyn and vancomycin, Thus far only 2 sputum culture has been collected,  -Initiate infectious disease and pulmonary team Dr. Nelda Marseille following  -Per history has been malaise for the past 3 weeks, weight loss over 45 pounds  - He has hx of incarceration and volunteered in homeless shelter, both decades ago, and recently immunosuppressed with prednisone  - Airborne precautions, culture blood and sputum, AFB sputum cultures, pending x3   -No immediate plan for bronchoscopy -HIV negative  -Per ID presumed TB treatment, along with vancomycin and Zosyn -Obtaining alpha-fetoprotein   Transaminitis -as patient is also now on rifampin -Monitoring LFTs -Abdominal CT was reviewed -Acute finding on exam -History of nonspecific ascites, with thoracentesis, contributed to sarcoidosis, much improved on the CT scan --Hepatitis panel negative with exception of hepatitis C < 0.1, -Monitoring LFTs closely, avoiding any other toxins  Hypoxemia/mild  respiratory distress -Currently satting greater than 92% on 2 L of oxygen, will continue to monitor, PRN DuoNeb bronchodilators   Sarcoidosis  -Stable, follow with  rheumatology and reports recent intra-abdominal manifestations are clinically resolved with a course of prednisone, no maintenance dose    Hyponatremia  -On admission sodium is 126 -->131--->124 -In the setting of hypovolemia, will continue IV fluid hydration -SIADH work-up ordered -Sodium tablets  Thrombocytopenia  - On admission platelets was 92, currently 64---> 49 in a setting of sepsis and sarcoidosis -Stable no signs of bleeding -Monitoring very closely   Type II DM  - No A1c on file, recent diagnosis and possibly steroid-induced  - Follow CBG's and use a low-intensity SSI with Novolog as needed    Intractable hiccups -We will initiate Thorazine -Monitor closely   DVT prophylaxis: SCD's Code Status: Full  Family Communication: Significant other updated at bedside Consults called:  Infectious disease team/pulmonologist, critical care team Dr. Edison Nasuti Admission status: Inpatient    Procedures:    Dissipating bronchoscopy  Antimicrobials:  Anti-infectives (From admission, onward)   Start     Dose/Rate Route Frequency Ordered Stop   05/04/18 1800  isoniazid (NYDRAZID) tablet 300 mg     300 mg Oral Daily 05/04/18 1210     05/04/18 1800  pyrazinamide tablet 2,000 mg     2,000 mg Oral Daily 05/04/18 1210     05/04/18 1800  ethambutol (MYAMBUTOL) tablet 1,500 mg     1,500 mg Oral Daily 05/04/18 1210     05/04/18 1800  rifampin (RIFADIN) capsule 600 mg     600 mg Oral Daily 05/04/18 1601     05/04/18 1700  rifampin (RIFADIN) capsule 600 mg  Status:  Discontinued     600 mg Oral Daily 05/04/18 1210 05/04/18 1601   05/03/18 1000  vancomycin (VANCOCIN) IVPB 1000 mg/200 mL premix     1,000 mg 200 mL/hr over 60 Minutes Intravenous Every 12 hours 05/02/18 2020     05/03/18 0200  piperacillin-tazobactam (ZOSYN) IVPB 3.375 g     3.375 g 12.5 mL/hr over 240 Minutes Intravenous Every 8 hours 05/02/18 2020     05/02/18 2030  piperacillin-tazobactam (ZOSYN) IVPB 3.375 g      3.375 g 100 mL/hr over 30 Minutes Intravenous  Once 05/02/18 2017 05/02/18 2335   05/02/18 2030  vancomycin (VANCOCIN) 1,500 mg in sodium chloride 0.9 % 500 mL IVPB     1,500 mg 250 mL/hr over 120 Minutes Intravenous  Once 05/02/18 2017 05/02/18 2330   05/02/18 1830  cefTRIAXone (ROCEPHIN) 1 g in sodium chloride 0.9 % 100 mL IVPB  Status:  Discontinued     1 g 200 mL/hr over 30 Minutes Intravenous  Once 05/02/18 1828 05/02/18 2015   05/02/18 1830  azithromycin (ZITHROMAX) tablet 500 mg     500 mg Oral  Once 05/02/18 1828 05/02/18 1947        Objective: Vitals:   05/05/18 0154 05/05/18 0520 05/05/18 0804 05/05/18 0817  BP: 90/72 95/64 91/61    Pulse: 75 98 99   Resp:  16 (!) 22   Temp:  (!) 97.5 F (36.4 C) (!) 97.2 F (36.2 C)   TempSrc:  Oral    SpO2:  100% 95% 98%  Weight:      Height:        Intake/Output Summary (Last 24 hours) at 05/05/2018 1249 Last data filed at 05/05/2018 1024 Gross per 24 hour  Intake 1810 ml  Output 0 ml  Net 1810 ml   Filed Weights   05/02/18 2000  Weight: 75.3 kg (166 lb 0.1 oz)   BP 91/61 (BP Location: Right Arm)   Pulse 99   Temp (!) 97.2 F (36.2 C)   Resp (!) 22   Ht 6\' 2"  (1.88 m)   Wt 75.3 kg (166 lb 0.1 oz)   SpO2 98%   BMI 21.31 kg/m    Physical Exam  Constitution:  Alert, cooperative, no distress, use cachexia Psychiatric: Normal and stable mood and affect, cognition intact,   HEENT: Normocephalic, PERRL, otherwise with in Normal limits  Cardio vascular:  S1/S2, RRR, No murmure, No Rubs or Gallops  Chest/pulmonary: Positive breath sounds, positive for rhonchi and rubs in upper lobes Abdomen: Soft, non-tender, non-distended, bowel sounds,no masses, no organomegaly Muscular skeletal: Limited exam - in bed, able to move all 4 extremities, Normal strength,  Neuro: CNII-XII intact. , normal motor and sensation, reflexes intact  Extremities: No pitting edema lower extremities, +2 pulses  Skin: Dry, warm to touch, negative for  any Rashes, No open wounds     Data Reviewed: I have personally reviewed following labs and imaging studies  CBC: Recent Labs  Lab 05/02/18 1051 05/03/18 0056 05/04/18 0443 05/05/18 0602  WBC 8.0 5.8 5.4 4.1  NEUTROABS  --  5.6 5.3 3.9  HGB 14.3 11.3* 12.7* 12.9*  HCT 43.4 35.4* 39.0 39.6  MCV 76.8* 79.2 77.5* 79.2  PLT 92* 64* 49* 44*   Basic Metabolic Panel: Recent Labs  Lab 05/02/18 1051 05/04/18 0443 05/04/18 0915 05/05/18 0602 05/05/18 0806  NA 126* 130* 131* 124*  --   K 4.0 3.5 3.8 3.1*  --   CL 88* 94* 96* 90*  --   CO2 26 23 24 24   --   GLUCOSE 166* 90 97 79  --   BUN 11 9 9 14   --   CREATININE 0.99 0.70 0.74 0.96  --   CALCIUM 8.8* 7.8* 7.8* 7.5*  --   MG  --   --   --   --  1.8   GFR: Estimated Creatinine Clearance: 80.6 mL/min (by C-G formula based on SCr of 0.96 mg/dL). Liver Function Tests: Recent Labs  Lab 05/02/18 1500 05/04/18 0443 05/04/18 0915 05/05/18 0602  AST 129* 208* 207* 205*  ALT 90* 112* 117* 108*  ALKPHOS 158* 254* 259* 243*  BILITOT 1.4* 1.8* 1.8* 3.4*  PROT 5.7* 4.9* 4.7* 4.4*  ALBUMIN 2.2* 1.7* 1.8* 1.6*   CBG: Recent Labs  Lab 05/04/18 1630 05/04/18 1703 05/04/18 2210 05/05/18 0742 05/05/18 1208  GLUCAP 25* 176* 101* 75 104*   LThyroid Function Tests: Recent Labs    05/02/18 1500  TSH 2.416  FREET4 0.91  Sepsis Labs: Recent Labs  Lab 05/02/18 1516 05/02/18 1756 05/03/18 0056  LATICACIDVEN 2.27* 2.04* 1.0    Recent Results (from the past 240 hour(s))  Culture, blood (routine x 2) Call MD if unable to obtain prior to antibiotics being given     Status: None (Preliminary result)   Collection Time: 05/03/18 12:58 AM  Result Value Ref Range Status   Specimen Description BLOOD RIGHT HAND  Final   Special Requests   Final    BOTTLES DRAWN AEROBIC AND ANAEROBIC Blood Culture results may not be optimal due to an inadequate volume of blood received in culture bottles   Culture   Final    NO GROWTH 2  DAYS Performed at [facility], [place] [address],  [place], [place] [address]    Report Status PENDING  Incomplete  Culture, blood (routine x 2) Call MD if unable to obtain prior to antibiotics being given     Status: None (Preliminary result)   Collection Time: 05/03/18 12:58 AM  Result Value Ref Range Status   Specimen Description BLOOD RIGHT FOREARM  Final   Special Requests   Final    BOTTLES DRAWN AEROBIC AND ANAEROBIC Blood Culture results may not be optimal due to an inadequate volume of blood received in culture bottles   Culture   Final    NO GROWTH 2 DAYS Performed at [facility], [place] [address] [place], [place] [address]    Report Status PENDING  Incomplete  Culture, respiratory (NON-Expectorated)     Status: None (Preliminary result)   Collection Time: 05/03/18 10:39 PM  Result Value Ref Range Status   Specimen Description SPUTUM  Final   Special Requests NONE  Final   Gram Stain   Final    NO WBC SEEN FEW GRAM VARIABLE ROD FEW GRAM POSITIVE COCCI IN PAIRS RARE YEAST    Culture   Final    CULTURE REINCUBATED FOR BETTER GROWTH Performed at [facility], [place] [address], [place], [place] [address]    Report Status PENDING  Incomplete      Radiology Studies: Ct Abdomen Pelvis W Contrast  Result Date: 05/05/2018 CLINICAL DATA:  Abdominal pain. Fever. Abscess suspected. Unintentional weight loss. EXAM: CT ABDOMEN AND PELVIS WITH CONTRAST TECHNIQUE: Multidetector CT imaging of the abdomen and pelvis was performed using the standard protocol following bolus administration of intravenous contrast. CONTRAST:  100 mL ISOVUE-300 IOPAMIDOL (ISOVUE-300) INJECTION 61%, 154mL OMNIPAQUE IOHEXOL 300 MG/ML SOLN COMPARISON:  01/11/2018 FINDINGS: Lower chest: Lung bases show numerous centrilobular nodular opacities, new since the prior exam. There is a small area patchy ground-glass opacity the posterolateral right middle lobe. Minimal right pleural effusion. There is  dependent opacity in the posterior right lower lobe adjacent to the pleural effusion that is most likely atelectasis. Hepatobiliary: Liver normal in size and attenuation with no mass or focal lesion. There is gallbladder wall edema and pericholecystic fluid. No visible gallstones. No bile duct dilation. Pancreas: Unremarkable. No pancreatic ductal dilatation or surrounding inflammatory changes. Spleen: Normal in size without focal abnormality. Adrenals/Urinary Tract: 2.6 cm right adrenal mass, stable. Normal left adrenal gland. Subtle areas of relative hypoattenuation in both kidneys, most apparent in the lateral midpole of the left kidney. These may reflect areas bright is. There are new since prior exam stable 11 mm lower pole left renal mass consistent with a cyst. No other discrete renal masses. No stones. No hydronephrosis. Ureters are normal course and in caliber. Bladder is unremarkable. Stomach/Bowel: Stomach is unremarkable. No bowel dilation or wall thickening. No convincing inflammation. Normal appendix visualized. Vascular/Lymphatic: No pathologically enlarged lymph nodes. Aortic atherosclerosis. No aneurysm. Reproductive: Unremarkable. Other: Small amount of ascites, less than was present on the prior CT. There is no defined collection to suggest an abscess. Hazy opacities noted throughout the peritoneal and omental fat similar to the prior exam. Calcified peritoneal soft tissue mass in the left central abdomen adjacent to a loop of small bowel is stable from the prior study. Musculoskeletal: No fracture or acute finding. No osteoblastic or osteolytic lesions. IMPRESSION: 1. No evidence of an abscess. 2. There are new bilateral centrilobular pulmonary nodules. Although the differential diagnosis is relatively broad, since these are new, infection with endobronchial spread such as from  non tubercular mycobacteria or aspergillosis is suspected. Hypersensitivity pneumonitis is an alternative diagnosis.  This may be the source of fever. 3. Ascites, decreased when compared the prior CT. 4. Areas of subtle relative decreased attenuation in both kidneys, which could be due to pyelonephritis. No evidence of a renal or perirenal abscess. 5. Gallbladder wall thickening that is most likely nonspecific edema related to ascites. 6. No evidence of bowel inflammation. 7. Stable calcified mesenteric nodule. Electronically Signed   By: Lajean Manes M.D.   On: 05/05/2018 07:20    Scheduled Meds: . chlorproMAZINE  25 mg Oral TID  . ethambutol  1,500 mg Oral Daily  . insulin aspart  0-5 Units Subcutaneous QHS  . insulin aspart  0-9 Units Subcutaneous TID WC  . isoniazid  300 mg Oral Daily  . metoprolol succinate  50 mg Oral Daily  . pyrazinamide  2,000 mg Oral Daily  . vitamin B-6  50 mg Oral Daily  . rifampin  600 mg Oral Daily  . sodium chloride flush  3 mL Intravenous Q12H  . sodium chloride  1 g Oral BID WC   Continuous Infusions: . sodium chloride 75 mL/hr at 05/05/18 0845  . piperacillin-tazobactam (ZOSYN)  IV 3.375 g (05/05/18 1140)  . vancomycin 1,000 mg (05/05/18 1140)    Time spent: >35 minutes  Deatra James, MD [facility],  Pager [phone]  If 7PM-7AM, please contact night-coverage [email or web address]   Password [facility]  05/05/2018, 12:49 PM

## 2018-05-05 NOTE — Progress Notes (Signed)
Gave hypertonic saline 3% 2ml to induce sputum. Pt unable to cough up sputum sample after neb. RN notified.Sputum cup left at bedside. Pt instructed to call nurse if sputum is expectorated.

## 2018-05-05 NOTE — Progress Notes (Signed)
[facility]  Date of Admission:  05/02/2018   Total days of antibiotics 4        Day 4 Vanc/Zosyn        Day 2 RIPE         ASSESSMENT: Jeffrey Hayes is a 67 yo male with history of intra-abdominal sarcoidosis on prednisone x 3-4 months, alcoholic liver cirrhosis, T2DM, and chronic thrombocytopenia who presents with high grade fever, chills, night sweats, anorexia, weight loss, andwasfound to have several cavitary lung lesions on bilateral upper lobes concerning for TB. Currently on broad coverage with vancomycin and Zosyn and started on TB treatment empirically yesterday (5/30). Afebrile overnight and without leukocytosis. Also hypotensive. On exam he appers more fatigue and somnolent than yesterday and he remains tachycardic and tachypnic. Transaminases stable, but total bilirubin continues to trend up. CT abd/pelvis with gallbladder wall thickening thought to be associated with ascites, possible pyelonephritis, and new bilateral lower lobe nodules.  AFB smear collected x2, collecting 3rd sample this AM  HIV nonreactive  HCV total Ab, Hep B surface antigen and antibody, and Hep B core antibody all negative  Strep pneumo antigen negative  Blood culture 5/29 with no growth to date   PLAN: 1. Bilateral, reticular pulmonary infiltrates with cavitary lesions on upper lobes: Highly suspicious for TB. Unfortunately, only 2 samples of sputum collected and was started on RIPE therapy yesterday. Awaiting smear and culture results, but will benefit from bronchoscopy for further evaluation since has started TB treatment already. Will need to continue to monitor transaminases given hepatotoxic effects of rifampin. Will continue vancomycin and Zosyn as well for possible superimposed bacterial infection.   2. Bandemia: Unclear etiology. Can be seen in the setting of severe infection/sepsis, but would not expect this with TB. ?Malignancy.  3. Transamintis: Will continue  to monitor given plan to start RIPE therapy.  Principal Problem:   Severe sepsis ([place]) Active Problems:   Diabetes mellitus without complication ([place])   Sarcoidosis   Hypertension   Cavitary pneumonia   Thrombocytopenia ([facility])   Hyponatremia   LFTs abnormal   Intractable hiccups   Scheduled Meds: . chlorproMAZINE  25 mg Oral TID  . ethambutol  1,500 mg Oral Daily  . insulin aspart  0-5 Units Subcutaneous QHS  . insulin aspart  0-9 Units Subcutaneous TID WC  . isoniazid  300 mg Oral Daily  . metoprolol succinate  50 mg Oral Daily  . pyrazinamide  2,000 mg Oral Daily  . vitamin B-6  50 mg Oral Daily  . rifampin  600 mg Oral Daily  . sodium chloride flush  3 mL Intravenous Q12H  . sodium chloride  1 g Oral BID WC   Continuous Infusions: . sodium chloride 75 mL/hr at 05/05/18 0845  . piperacillin-tazobactam (ZOSYN)  IV Stopped (05/05/18 0545)  . vancomycin Stopped (05/04/18 2313)   PRN Meds:.bisacodyl, HYDROcodone-acetaminophen, ibuprofen, ondansetron **OR** ondansetron (ZOFRAN) IV, senna-docusate   SUBJECTIVE: Afebrile overnight, now mildly hypothermic. Not tachycardic. Remains hypotensive. Appears sleepy this morning. Denies cough. Continues to report poor appetite. States feels overall tired and just wants to sleep.   Review of Systems: Review of Systems  Constitutional: Positive for chills and malaise/fatigue. Negative for diaphoresis and fever.  Respiratory: Negative for cough and shortness of breath.   Cardiovascular: Negative for chest pain and palpitations.  Gastrointestinal: Negative for abdominal pain, diarrhea, nausea and vomiting.  Neurological: Negative for dizziness and headaches.    No  Known Allergies  OBJECTIVE: Vitals:   05/05/18 0154 05/05/18 0520 05/05/18 0804 05/05/18 0817  BP: 90/72 95/64 91/61    Pulse: 75 98 99   Resp:  16 (!) 22   Temp:  (!) 97.5 F (36.4 C) (!) 97.2 F (36.2 C)   TempSrc:  Oral    SpO2:  100% 95% 98%  Weight:        Height:       Body mass index is 21.31 kg/m.  Physical Exam  Constitutional:  Elderly, malnourished male lying in bed. Appears tired and is unable to speak in full sentences   Cardiovascular:  Tachycardic, nl S1/S2, no mrg   Pulmonary/Chest:  Tachypneic, CTAB  Abdominal: Soft. Bowel sounds are normal.  Musculoskeletal: He exhibits no edema.  Neurological:  Somewhat somnolent, but oriented x3 with gross neurological deficits     Lab Results Lab Results  Component Value Date   WBC 4.1 05/05/2018   HGB 12.9 (L) 05/05/2018   HCT 39.6 05/05/2018   MCV 79.2 05/05/2018   PLT 44 (L) 05/05/2018    Lab Results  Component Value Date   CREATININE 0.96 05/05/2018   BUN 14 05/05/2018   NA 124 (L) 05/05/2018   K 3.1 (L) 05/05/2018   CL 90 (L) 05/05/2018   CO2 24 05/05/2018    Lab Results  Component Value Date   ALT 108 (H) 05/05/2018   AST 205 (H) 05/05/2018   ALKPHOS 243 (H) 05/05/2018   BILITOT 3.4 (H) 05/05/2018     Microbiology: Recent Results (from the past 240 hour(s))  Culture, blood (routine x 2) Call MD if unable to obtain prior to antibiotics being given     Status: None (Preliminary result)   Collection Time: 05/03/18 12:58 AM  Result Value Ref Range Status   Specimen Description BLOOD RIGHT HAND  Final   Special Requests   Final    BOTTLES DRAWN AEROBIC AND ANAEROBIC Blood Culture results may not be optimal due to an inadequate volume of blood received in culture bottles   Culture   Final    NO GROWTH 1 DAY Performed at [facility], [place] [address], [place], [place] [address]    Report Status PENDING  Incomplete  Culture, blood (routine x 2) Call MD if unable to obtain prior to antibiotics being given     Status: None (Preliminary result)   Collection Time: 05/03/18 12:58 AM  Result Value Ref Range Status   Specimen Description BLOOD RIGHT FOREARM  Final   Special Requests   Final    BOTTLES DRAWN AEROBIC AND ANAEROBIC Blood Culture results may not  be optimal due to an inadequate volume of blood received in culture bottles   Culture   Final    NO GROWTH 1 DAY Performed at [facility], [place] [address] [place], [place] [address]    Report Status PENDING  Incomplete  Culture, respiratory (NON-Expectorated)     Status: None (Preliminary result)   Collection Time: 05/03/18 10:39 PM  Result Value Ref Range Status   Specimen Description SPUTUM  Final   Special Requests NONE  Final   Gram Stain   Final    NO WBC SEEN FEW GRAM VARIABLE ROD FEW GRAM POSITIVE COCCI IN PAIRS RARE YEAST    Culture   Final    CULTURE REINCUBATED FOR BETTER GROWTH Performed at [facility], [place] [address], [place], [place] [address]    Report Status PENDING  Incomplete  Welford Roche, [place] [facility] [phone] pager   [phone] cell 05/05/2018, 10:47 AM

## 2018-05-05 NOTE — Progress Notes (Signed)
Name: Jeffrey Hayes MRN: [id number] DOB: 1951/08/26    ADMISSION DATE:  05/02/2018 CONSULTATION DATE: 05/03/2018  REFERRING MD : [facility]  CHIEF COMPLAINT: Exertional dyspnea  BRIEF PATIENT DESCRIPTION: 67 year old with progressive dyspnea  SIGNIFICANT EVENTS    STUDIES:  529 CT scan is noted   HISTORY OF PRESENT ILLNESS:   Jeffrey Hayes is a 67 year old male who has a past medical history that is notable for diabetes, sarcoidosis which was diagnosed via biopsy, history of cirrhosis with recent paracentesis of 4 L of clear fluid obtained.  He presents with development of weight loss loss of appetite and exertional dyspnea.  He does report coughing up sputum but denies hemoptysis.  He was evaluated by [facility] hospitalist CT scan was performed which shows extensive cavitary pneumonia and infiltrates.  Pulmonary critical care has been consulted to evaluate.  Radiographic findings make tuberculosis strong candidate.  He will have sputum was induced for AFB but this is not sufficient he will may require bronchoscopy in the future.  Note that he has been evaluated for HIV.  SUBJECTIVE:  No events overnight, no new complaints  VITAL SIGNS: Temp:  [97.2 F (36.2 C)-103.2 F (39.6 C)] 97.2 F (36.2 C) (05/31 0804) Pulse Rate:  [75-111] 99 (05/31 0804) Resp:  [16-28] 22 (05/31 0804) BP: (84-102)/(61-72) 91/61 (05/31 0804) SpO2:  [94 %-100 %] 98 % (05/31 0817)  PHYSICAL EXAMINATION: General:  Chronically ill appearing male, NAD Neuro:  Awake and interactive, moving all ext to command HEENT:  /AT, PERRL, EOM-I and MMM Cardiovascular:  RRR, Nl S1/S2 and -M/R/G Lungs:  Decreased BS diffusely Abdomen:  Soft, NT, ND and +BS Musculoskeletal:  -edema and -tenderness Skin:  Intact  Recent Labs  Lab 05/04/18 0443 05/04/18 0915 05/05/18 0602  NA 130* 131* 124*  K 3.5 3.8 3.1*  CL 94* 96* 90*  CO2 23 24 24   BUN 9 9 14   CREATININE 0.70 0.74 0.96  GLUCOSE 90 97 79   Recent Labs  Lab  05/03/18 0056 05/04/18 0443 05/05/18 0602  HGB 11.3* 12.7* 12.9*  HCT 35.4* 39.0 39.6  WBC 5.8 5.4 4.1  PLT 64* 49* 44*   Ct Abdomen Pelvis W Contrast  Result Date: 05/05/2018 CLINICAL DATA:  Abdominal pain. Fever. Abscess suspected. Unintentional weight loss. EXAM: CT ABDOMEN AND PELVIS WITH CONTRAST TECHNIQUE: Multidetector CT imaging of the abdomen and pelvis was performed using the standard protocol following bolus administration of intravenous contrast. CONTRAST:  100 mL ISOVUE-300 IOPAMIDOL (ISOVUE-300) INJECTION 61%, 188mL OMNIPAQUE IOHEXOL 300 MG/ML SOLN COMPARISON:  01/11/2018 FINDINGS: Lower chest: Lung bases show numerous centrilobular nodular opacities, new since the prior exam. There is a small area patchy ground-glass opacity the posterolateral right middle lobe. Minimal right pleural effusion. There is dependent opacity in the posterior right lower lobe adjacent to the pleural effusion that is most likely atelectasis. Hepatobiliary: Liver normal in size and attenuation with no mass or focal lesion. There is gallbladder wall edema and pericholecystic fluid. No visible gallstones. No bile duct dilation. Pancreas: Unremarkable. No pancreatic ductal dilatation or surrounding inflammatory changes. Spleen: Normal in size without focal abnormality. Adrenals/Urinary Tract: 2.6 cm right adrenal mass, stable. Normal left adrenal gland. Subtle areas of relative hypoattenuation in both kidneys, most apparent in the lateral midpole of the left kidney. These may reflect areas bright is. There are new since prior exam stable 11 mm lower pole left renal mass consistent with a cyst. No other discrete renal masses. No stones. No hydronephrosis. Ureters are  normal course and in caliber. Bladder is unremarkable. Stomach/Bowel: Stomach is unremarkable. No bowel dilation or wall thickening. No convincing inflammation. Normal appendix visualized. Vascular/Lymphatic: No pathologically enlarged lymph nodes. Aortic  atherosclerosis. No aneurysm. Reproductive: Unremarkable. Other: Small amount of ascites, less than was present on the prior CT. There is no defined collection to suggest an abscess. Hazy opacities noted throughout the peritoneal and omental fat similar to the prior exam. Calcified peritoneal soft tissue mass in the left central abdomen adjacent to a loop of small bowel is stable from the prior study. Musculoskeletal: No fracture or acute finding. No osteoblastic or osteolytic lesions. IMPRESSION: 1. No evidence of an abscess. 2. There are new bilateral centrilobular pulmonary nodules. Although the differential diagnosis is relatively broad, since these are new, infection with endobronchial spread such as from non tubercular mycobacteria or aspergillosis is suspected. Hypersensitivity pneumonitis is an alternative diagnosis. This may be the source of fever. 3. Ascites, decreased when compared the prior CT. 4. Areas of subtle relative decreased attenuation in both kidneys, which could be due to pyelonephritis. No evidence of a renal or perirenal abscess. 5. Gallbladder wall thickening that is most likely nonspecific edema related to ascites. 6. No evidence of bowel inflammation. 7. Stable calcified mesenteric nodule. Electronically Signed   By: Lajean Manes M.D.   On: 05/05/2018 07:20    I reviewed chest CT myself, cavitary lesions noted and infiltrate noted  ASSESSMENT Principal Problem:   Cavitary pneumonia Active Problems:   Diabetes mellitus without complication ([place])   Sarcoidosis   Hypertension   Thrombocytopenia (HCC)   Hyponatremia   LFTs abnormal  Cirrhosis   Discussion: 67 year old male with previous history of incarceration and doing community service in homeless shelter, as well as being a reformed alcoholic with cirrhosis and smoker who presents to the hospital with SOB, fever, lethargy and weight loss.  Patient was in his normal state of health until 4 months ago when he was diagnosed  with sarcoidosis through an omental biopsy during his evaluation for liver failure.  Patient was started on 40 mg of prednisone daily for 1 month then dropped to 35 mg PO daily x1 month then 30 mg daily.  He stopped taking his prednisone 2 wks ago after felling unwell as above.  Also reports fever x2 months, SOB and wt loss of 37 lbs in 4 months.  No hemoptysis or contact that he recalls with TB.  Discussed with PCCM-NP.  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 steroids  - Quantiferon gold pending  - HIV negative  - Continue negative pressures  - F/U on cultures and AFB  - Appreciate input from ID, will defer starting treatment of MTB to ID  - F/U on AFB, one collected, can collect up to 8 hours apart  Cavitary lesions: TB vs cavitary PNA vs cancer  - If AFB is negative will consider bronch  - If findings persist post treatment then will need bronchoscopy and BAL  HCAP:  - F/U on cultures  - Vanc  - Zosyn  - Tailor when cultures are available  - ID following  Hypoxemia  - Titrate O2 for sat of 88-92%  PCCM will followng  Rush Farmer, M.D. [facility] Pulmonary/Critical Care Medicine. Pager: [phone]. After hours pager: [phone]

## 2018-05-06 DIAGNOSIS — D509 Iron deficiency anemia, unspecified: Secondary | ICD-10-CM

## 2018-05-06 LAB — COMPREHENSIVE METABOLIC PANEL
ALK PHOS: 394 U/L — AB (ref 38–126)
ALT: 117 U/L — ABNORMAL HIGH (ref 17–63)
ANION GAP: 9 (ref 5–15)
AST: 237 U/L — ABNORMAL HIGH (ref 15–41)
Albumin: 1.5 g/dL — ABNORMAL LOW (ref 3.5–5.0)
BILIRUBIN TOTAL: 4.4 mg/dL — AB (ref 0.3–1.2)
BUN: 15 mg/dL (ref 6–20)
CALCIUM: 7.2 mg/dL — AB (ref 8.9–10.3)
CO2: 21 mmol/L — ABNORMAL LOW (ref 22–32)
Chloride: 96 mmol/L — ABNORMAL LOW (ref 101–111)
Creatinine, Ser: 0.85 mg/dL (ref 0.61–1.24)
GFR calc non Af Amer: >60 mL/min (ref >60)
Glucose, Bld: 81 mg/dL (ref 65–99)
Potassium: 3.7 mmol/L (ref 3.5–5.1)
Sodium: 126 mmol/L — ABNORMAL LOW (ref 135–145)
Total Protein: 4.1 g/dL — ABNORMAL LOW (ref 6.5–8.1)

## 2018-05-06 LAB — CBC WITH DIFFERENTIAL/PLATELET
Basophils Absolute: 0 10*3/uL (ref 0.0–0.1)
Basophils Relative: 0 %
EOS ABS: 0 10*3/uL (ref 0.0–0.7)
Eosinophils Relative: 0 %
HCT: 36.7 % — ABNORMAL LOW (ref 39.0–52.0)
HEMOGLOBIN: 12.2 g/dL — AB (ref 13.0–17.0)
LYMPHS PCT: 2 %
Lymphs Abs: 0.1 10*3/uL — ABNORMAL LOW (ref 0.7–4.0)
MCH: 25.5 pg — ABNORMAL LOW (ref 26.0–34.0)
MCHC: 33.2 g/dL (ref 30.0–36.0)
MCV: 76.6 fL — ABNORMAL LOW (ref 78.0–100.0)
Monocytes Absolute: 0.1 10*3/uL (ref 0.1–1.0)
Monocytes Relative: 2 %
NEUTROS ABS: 4.3 10*3/uL (ref 1.7–7.7)
NEUTROS PCT: 96 %
Platelets: 41 10*3/uL — ABNORMAL LOW (ref 150–400)
RBC: 4.79 MIL/uL (ref 4.22–5.81)
RDW: 17.3 % — ABNORMAL HIGH (ref 11.5–15.5)
WBC: 4.5 10*3/uL (ref 4.0–10.5)

## 2018-05-06 LAB — GLUCOSE, CAPILLARY
GLUCOSE-CAPILLARY: 135 mg/dL — AB (ref 65–99)
GLUCOSE-CAPILLARY: 111 mg/dL — AB (ref 65–99)
GLUCOSE-CAPILLARY: 96 mg/dL (ref 65–99)
Glucose-Capillary: 78 mg/dL (ref 65–99)

## 2018-05-06 LAB — CULTURE, RESPIRATORY W GRAM STAIN
Culture: NORMAL
Gram Stain: NONE SEEN

## 2018-05-06 LAB — AFP TUMOR MARKER: AFP, Serum, Tumor Marker: 1.2 ng/mL (ref 0.0–8.3)

## 2018-05-06 MED ORDER — ENSURE ENLIVE PO LIQD
237.0000 mL | Freq: Three times a day (TID) | ORAL | Status: DC
  Administered 2018-05-06 – 2018-05-11 (×8): 237 mL via ORAL

## 2018-05-06 MED ORDER — LEVOFLOXACIN 750 MG PO TABS
750.0000 mg | ORAL_TABLET | Freq: Every day | ORAL | Status: DC
  Administered 2018-05-07 – 2018-05-11 (×5): 750 mg via ORAL
  Filled 2018-05-06 (×5): qty 1

## 2018-05-06 MED ORDER — METOPROLOL TARTRATE 12.5 MG HALF TABLET
12.5000 mg | ORAL_TABLET | Freq: Two times a day (BID) | ORAL | Status: DC
  Administered 2018-05-07 – 2018-05-15 (×15): 12.5 mg via ORAL
  Filled 2018-05-06 (×16): qty 1

## 2018-05-06 NOTE — Progress Notes (Signed)
[facility] PROGRESS NOTE  Jeffrey Hayes TDV:[id number] DOB: 06/11/1951 DOA: 05/02/2018  PCP: [name], MD  Brief History/Interval Summary: 67 y.o.malewith medical history significant forhypertension, recent diagnosis of diabetes, and sarcoidosis with intra-abdominal manifestations status post recent course of prednisone, presenting to the emergency department for evaluation of poor appetite with weight loss, palpitations, and exertional dyspnea over 3 weeks. CTA of the chest revealed no PE, bilateral infiltrate, upper lobe cavitary lesion consistent with infection/infiltrate. He has hx of incarceration and volunteered in homeless shelter, both decades ago, and recently immunosuppressed with prednisone. Cultures were obtained. Infectious disease and pulmonary was consulted.  Reason for Visit: Cavitary lesions in the lung  Consultants: Pulmonology.  Infectious disease.  Procedures: None yet  Antibiotics: Vancomycin and Zosyn Isoniazid, pyrazinamide, ethambutol Also started on rifampin which was discontinued due to abnormal LFTs Levaquin  Subjective/Interval History: Patient appears to be confused and distracted this morning.  Unable to provide much information.  ROS: Unable to do due to confusion  Objective:  Vital Signs  Vitals:   05/05/18 2117 05/05/18 2120 05/06/18 0455 05/06/18 0952  BP: 102/67  122/81 127/76  Pulse: (!) 122  (!) 115 (!) 108  Resp: (!) 38 (!) 22 (!) 36 16  Temp: 98.3 F (36.8 C)  97.9 F (36.6 C)   TempSrc: Oral  Oral   SpO2: 98% 98% 96% 100%  Weight:      Height:        Intake/Output Summary (Last 24 hours) at 05/06/2018 1257 Last data filed at 05/06/2018 1000 Gross per 24 hour  Intake 2503.75 ml  Output 122 ml  Net 2381.75 ml   Filed Weights   05/02/18 2000  Weight: 75.3 kg (166 lb 0.1 oz)    General appearance: alert, cooperative, distracted and no distress Head: Normocephalic, without obvious abnormality, atraumatic Resp:  Coarse breath sounds bilaterally.  Crackles at the bases.  No rhonchi.  No wheezing.  Normal effort at rest. Cardio: regular rate and rhythm, S1, S2 normal, no murmur, click, rub or gallop GI: soft, non-tender; bowel sounds normal; no masses,  no organomegaly Extremities: extremities normal, atraumatic, no cyanosis or edema Neurologic: No focal deficits.  Seems to be confused and disoriented.  Lab Results:  Data Reviewed: I have personally reviewed following labs and imaging studies  CBC: Recent Labs  Lab 05/02/18 1051 05/03/18 0056 05/04/18 0443 05/05/18 0602 05/06/18 0547  WBC 8.0 5.8 5.4 4.1 4.5  NEUTROABS  --  5.6 5.3 3.9 4.3  HGB 14.3 11.3* 12.7* 12.9* 12.2*  HCT 43.4 35.4* 39.0 39.6 36.7*  MCV 76.8* 79.2 77.5* 79.2 76.6*  PLT 92* 64* 49* 44* 41*    Basic Metabolic Panel: Recent Labs  Lab 05/02/18 1051 05/04/18 0443 05/04/18 0915 05/05/18 0602 05/05/18 0806 05/06/18 0547  NA 126* 130* 131* 124*  --  126*  K 4.0 3.5 3.8 3.1*  --  3.7  CL 88* 94* 96* 90*  --  96*  CO2 26 23 24 24   --  21*  GLUCOSE 166* 90 97 79  --  81  BUN 11 9 9 14   --  15  CREATININE 0.99 0.70 0.74 0.96  --  0.85  CALCIUM 8.8* 7.8* 7.8* 7.5*  --  7.2*  MG  --   --   --   --  1.8  --     GFR: Estimated Creatinine Clearance: 91 mL/min (by C-G formula based on SCr of 0.85 mg/dL).  Liver Function Tests: Recent Labs  Lab 05/02/18 1500 05/04/18 0443 05/04/18 0915 05/05/18 0602 05/06/18 0547  AST 129* 208* 207* 205* 237*  ALT 90* 112* 117* 108* 117*  ALKPHOS 158* 254* 259* 243* 394*  BILITOT 1.4* 1.8* 1.8* 3.4* 4.4*  PROT 5.7* 4.9* 4.7* 4.4* 4.1*  ALBUMIN 2.2* 1.7* 1.8* 1.6* 1.5*    CBG: Recent Labs  Lab 05/05/18 1208 05/05/18 1637 05/05/18 2120 05/06/18 0751 05/06/18 1154  GLUCAP 104* 83 83 78 135*     Recent Results (from the past 240 hour(s))  Culture, blood (routine x 2) Call MD if unable to obtain prior to antibiotics being given     Status: None (Preliminary result)     Collection Time: 05/03/18 12:58 AM  Result Value Ref Range Status   Specimen Description BLOOD RIGHT HAND  Final   Special Requests   Final    BOTTLES DRAWN AEROBIC AND ANAEROBIC Blood Culture results may not be optimal due to an inadequate volume of blood received in culture bottles   Culture   Final    NO GROWTH 2 DAYS Performed at [facility], [place] [address], [place], [place] [address]    Report Status PENDING  Incomplete  Culture, blood (routine x 2) Call MD if unable to obtain prior to antibiotics being given     Status: None (Preliminary result)   Collection Time: 05/03/18 12:58 AM  Result Value Ref Range Status   Specimen Description BLOOD RIGHT FOREARM  Final   Special Requests   Final    BOTTLES DRAWN AEROBIC AND ANAEROBIC Blood Culture results may not be optimal due to an inadequate volume of blood received in culture bottles   Culture   Final    NO GROWTH 2 DAYS Performed at [facility], [place] [address] [place], [place] [address]    Report Status PENDING  Incomplete  Acid Fast Smear (AFB)     Status: None   Collection Time: 05/03/18 10:39 PM  Result Value Ref Range Status   AFB Specimen Processing Concentration  Final   Acid Fast Smear Negative  Final    Comment: (NOTE) Performed At: [facility] [place], [place] [id number] Rush Farmer MD EY:[id number]    Source (AFB) SPUTUM  Final  Culture, respiratory (NON-Expectorated)     Status: None   Collection Time: 05/03/18 10:39 PM  Result Value Ref Range Status   Specimen Description SPUTUM  Final   Special Requests NONE  Final   Gram Stain   Final    NO WBC SEEN FEW GRAM VARIABLE ROD FEW GRAM POSITIVE COCCI IN PAIRS RARE YEAST    Culture   Final    FEW Consistent with normal respiratory flora. Performed at [facility], [place] [address] [place], [place] [address]    Report Status 05/06/2018 FINAL  Final  Acid Fast Smear (AFB)     Status: None   Collection Time:  05/04/18 11:08 AM  Result Value Ref Range Status   AFB Specimen Processing Concentration  Final   Acid Fast Smear Negative  Final    Comment: (NOTE) Performed At: [facility] [place], [place] [id number] Rush Farmer MD OY:[id number] Performed at [facility], [place] [address], [place], [place] [address]    Source (AFB) SPUTUM  Final      Radiology Studies: Ct Abdomen Pelvis W Contrast  Result Date: 05/05/2018 CLINICAL DATA:  Abdominal pain. Fever. Abscess suspected. Unintentional weight loss. EXAM: CT ABDOMEN AND  PELVIS WITH CONTRAST TECHNIQUE: Multidetector CT imaging of the abdomen and pelvis was performed using the standard protocol following bolus administration of intravenous contrast. CONTRAST:  100 mL ISOVUE-300 IOPAMIDOL (ISOVUE-300) INJECTION 61%, 111mL OMNIPAQUE IOHEXOL 300 MG/ML SOLN COMPARISON:  01/11/2018 FINDINGS: Lower chest: Lung bases show numerous centrilobular nodular opacities, new since the prior exam. There is a small area patchy ground-glass opacity the posterolateral right middle lobe. Minimal right pleural effusion. There is dependent opacity in the posterior right lower lobe adjacent to the pleural effusion that is most likely atelectasis. Hepatobiliary: Liver normal in size and attenuation with no mass or focal lesion. There is gallbladder wall edema and pericholecystic fluid. No visible gallstones. No bile duct dilation. Pancreas: Unremarkable. No pancreatic ductal dilatation or surrounding inflammatory changes. Spleen: Normal in size without focal abnormality. Adrenals/Urinary Tract: 2.6 cm right adrenal mass, stable. Normal left adrenal gland. Subtle areas of relative hypoattenuation in both kidneys, most apparent in the lateral midpole of the left kidney. These may reflect areas bright is. There are new since prior exam stable 11 mm lower pole left renal mass consistent with a cyst. No other discrete renal masses. No stones. No  hydronephrosis. Ureters are normal course and in caliber. Bladder is unremarkable. Stomach/Bowel: Stomach is unremarkable. No bowel dilation or wall thickening. No convincing inflammation. Normal appendix visualized. Vascular/Lymphatic: No pathologically enlarged lymph nodes. Aortic atherosclerosis. No aneurysm. Reproductive: Unremarkable. Other: Small amount of ascites, less than was present on the prior CT. There is no defined collection to suggest an abscess. Hazy opacities noted throughout the peritoneal and omental fat similar to the prior exam. Calcified peritoneal soft tissue mass in the left central abdomen adjacent to a loop of small bowel is stable from the prior study. Musculoskeletal: No fracture or acute finding. No osteoblastic or osteolytic lesions. IMPRESSION: 1. No evidence of an abscess. 2. There are new bilateral centrilobular pulmonary nodules. Although the differential diagnosis is relatively broad, since these are new, infection with endobronchial spread such as from non tubercular mycobacteria or aspergillosis is suspected. Hypersensitivity pneumonitis is an alternative diagnosis. This may be the source of fever. 3. Ascites, decreased when compared the prior CT. 4. Areas of subtle relative decreased attenuation in both kidneys, which could be due to pyelonephritis. No evidence of a renal or perirenal abscess. 5. Gallbladder wall thickening that is most likely nonspecific edema related to ascites. 6. No evidence of bowel inflammation. 7. Stable calcified mesenteric nodule. Electronically Signed   By: Lajean Manes M.D.   On: 05/05/2018 07:20     Medications:  Scheduled: . chlorproMAZINE  50 mg Oral TID  . ethambutol  1,500 mg Oral Daily  . insulin aspart  0-5 Units Subcutaneous QHS  . insulin aspart  0-9 Units Subcutaneous TID WC  . isoniazid  300 mg Oral Daily  . [START ON 05/07/2018] levofloxacin  750 mg Oral Daily  . metoprolol succinate  50 mg Oral Daily  . pyrazinamide  2,000 mg  Oral Daily  . vitamin B-6  50 mg Oral Daily  . sodium chloride flush  3 mL Intravenous Q12H  . sodium chloride  1 g Oral BID WC   Continuous: . sodium chloride 75 mL/hr at 05/06/18 0200  . piperacillin-tazobactam (ZOSYN)  IV 3.375 g (05/06/18 1132)  . vancomycin 1,000 mg (05/06/18 1130)   QIO:NGEXBMWUX, HYDROcodone-acetaminophen, ibuprofen, ondansetron **OR** ondansetron (ZOFRAN) IV, senna-docusate  Assessment/Plan:    Cavitary lung lesions/bilateral pneumonia/sepsis CTA of the chest revealed bilateral infiltrates with upper lobe cavitary  lesion.  Concern is for tuberculosis.  Infectious disease and pulmonology following.  Patient is on broad-spectrum antibiotic coverage with vancomycin and Zosyn.  Also on antitubercular treatment.  ATT had to be adjusted due to transaminitis.  Rifampin was discontinued.  He was started on Levaquin.  Also on INH, pyrazinamide and ethambutol.  2 AFB smears have been negative.  One more is pending.  Culture was negative.  Cultures are negative so far.  Sepsis physiology appears to have improved.  No immediate plans for bronchoscopy but could be considered depending on results of testing ordered so far.  HIV nonreactive.  Transaminitis with hyperbilirubinemia LFTs continue to be abnormal.  Bilirubin continues to rise.  Rifampin has been discontinued.  Hepatobiliary system on the recently done CT scan did not show any ductal dilatation.  Hepatitis panel was negative.  Avoid hepatotoxic agents.  Stop Thorazine.  Acute metabolic encephalopathy Seems to be mildly confused this morning.  No focal neurological deficits.  Baseline is unknown.  Check ammonia level.  It appears that patient was started on Thorazine for hiccups.  This could be the reason for his confusion.  We will discontinue.  Acute hypoxic respiratory failure Most likely secondary to the infectious process.  Continue oxygen.  Bronchodilators as needed.  History of sarcoidosis Patient follows with  rheumatology.  He has intra-abdominal manifestations.  Recently completed a course of prednisone.  Not on maintenance dose.  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  Placed on sodium tablets.  Sodium level is stable.  Thrombocytopenia Reason for thrombocytopenia not entirely clear.  Possibly due to sepsis.  No evidence for bleeding.  Continue to monitor closely.  Counts were 92,000 at admission.  Today is 41,000.  Diabetes mellitus type 2 Possibly steroid-induced as this is a recent diagnosis.  Continue to monitor CBGs.  Microcytic anemia No evidence for overt bleeding.  Anemia panel.  Intractable hiccups Initiated on Thorazine appears to be causing side effects symptoms of altered mental status and could also be causing his liver function abnormalities.  We will stop for now.  Essential hypertension Amlodipine is on hold.  Blood pressure noted to be soft.  We will cut back on the dose of beta-blocker.  DVT Prophylaxis: SCDs    Code Status: Full code Family Communication: No family at bedside Disposition Plan: Management as outlined above.  Seems deconditioned.  PT evaluation.    LOS: 4 days   [facility] Pager [phone] 05/06/2018, 12:57 PM  If 7PM-7AM, please contact night-coverage at [email or web address], password [facility]

## 2018-05-06 NOTE — Progress Notes (Addendum)
[facility] for Infectious Disease    Date of Admission:  05/02/2018   Total days of antibiotics 5        Day 3 of LIPE  ID: Jeffrey Hayes is a 67 y.o. 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 tbili continues to rise. His wife states that he was confused this morning. Having poor appetite little oral intake yesterday. Did not eat breakfast but had ensure. Did not eat lunch today  He did provide a teaspoon of sputum in his AFB collection tube  Medications:  . chlorproMAZINE  50 mg Oral TID  . ethambutol  1,500 mg Oral Daily  . insulin aspart  0-5 Units Subcutaneous QHS  . insulin aspart  0-9 Units Subcutaneous TID WC  . isoniazid  300 mg Oral Daily  . levofloxacin  750 mg Oral Daily  . metoprolol succinate  50 mg Oral Daily  . pyrazinamide  2,000 mg Oral Daily  . vitamin B-6  50 mg Oral Daily  . sodium chloride flush  3 mL Intravenous Q12H  . sodium chloride  1 g Oral BID WC    Objective: Vital signs in last 24 hours: Temp:  [97.9 F (36.6 C)-99 F (37.2 C)] 97.9 F (36.6 C) (06/01 0455) Pulse Rate:  [103-122] 108 (06/01 0952) Resp:  [16-38] 16 (06/01 0952) BP: (94-127)/(67-81) 127/76 (06/01 0952) SpO2:  [96 %-100 %] 100 % (06/01 4580) Physical Exam  Constitutional: He is oriented to person, place. He appears frail, chronically ill, mal-nourished. No distress.  HENT:  Mouth/Throat: Oropharynx is clear and moist. No oropharyngeal exudate.  Cardiovascular: Normal rate, regular rhythm and normal heart sounds. Exam reveals no gallop and no friction rub.  No murmur heard.  Pulmonary/Chest: Effort normal and breath sounds normal. No respiratory distress. He has no wheezes.  Abdominal: Soft. Bowel sounds are  diminshed. Slightly distension. There is no tenderness.  Lymphadenopathy:  He has no cervical adenopathy.  Skin: Skin is warm and dry. No rash noted. No erythema.  Psychiatric: He appears lethargic  Lab Results Recent Labs    05/05/18 0602 05/06/18 0547  WBC 4.1 4.5  HGB 12.9* 12.2*  HCT 39.6 36.7*  NA 124* 126*  K 3.1* 3.7  CL 90* 96*  CO2 24 21*  BUN 14 15  CREATININE 0.96 0.85   Liver Panel Recent Labs    05/05/18 0602 05/06/18 0547  PROT 4.4* 4.1*  ALBUMIN 1.6* 1.5*  AST 205* 237*  ALT 108* 117*  ALKPHOS 243* 394*  BILITOT 3.4* 4.4*    Microbiology: afb pending Studies/Results: Ct Abdomen Pelvis W Contrast  Result Date: 05/05/2018 CLINICAL DATA:  Abdominal pain. Fever. Abscess suspected. Unintentional weight loss. EXAM: CT ABDOMEN AND PELVIS WITH CONTRAST TECHNIQUE: Multidetector CT imaging of the abdomen and pelvis was performed using the standard protocol following bolus administration of intravenous contrast. CONTRAST:  100 mL ISOVUE-300 IOPAMIDOL (ISOVUE-300) INJECTION 61%, 160mL OMNIPAQUE IOHEXOL 300 MG/ML SOLN COMPARISON:  01/11/2018 FINDINGS: Lower chest: Lung bases show numerous centrilobular nodular opacities, new since the prior exam. There is a small area patchy ground-glass opacity the posterolateral right middle lobe. Minimal right pleural effusion. There is dependent opacity in the posterior right lower lobe adjacent to  the pleural effusion that is most likely atelectasis. Hepatobiliary: Liver normal in size and attenuation with no mass or focal lesion. There is gallbladder wall edema and pericholecystic fluid. No visible gallstones. No bile duct dilation. Pancreas: Unremarkable. No pancreatic ductal dilatation or surrounding inflammatory changes. Spleen: Normal in size without focal abnormality. Adrenals/Urinary Tract: 2.6 cm right adrenal mass, stable. Normal left adrenal gland. Subtle areas of relative hypoattenuation in both kidneys, most apparent in the  lateral midpole of the left kidney. These may reflect areas bright is. There are new since prior exam stable 11 mm lower pole left renal mass consistent with a cyst. No other discrete renal masses. No stones. No hydronephrosis. Ureters are normal course and in caliber. Bladder is unremarkable. Stomach/Bowel: Stomach is unremarkable. No bowel dilation or wall thickening. No convincing inflammation. Normal appendix visualized. Vascular/Lymphatic: No pathologically enlarged lymph nodes. Aortic atherosclerosis. No aneurysm. Reproductive: Unremarkable. Other: Small amount of ascites, less than was present on the prior CT. There is no defined collection to suggest an abscess. Hazy opacities noted throughout the peritoneal and omental fat similar to the prior exam. Calcified peritoneal soft tissue mass in the left central abdomen adjacent to a loop of small bowel is stable from the prior study. Musculoskeletal: No fracture or acute finding. No osteoblastic or osteolytic lesions. IMPRESSION: 1. No evidence of an abscess. 2. There are new bilateral centrilobular pulmonary nodules. Although the differential diagnosis is relatively broad, since these are new, infection with endobronchial spread such as from non tubercular mycobacteria or aspergillosis is suspected. Hypersensitivity pneumonitis is an alternative diagnosis. This may be the source of fever. 3. Ascites, decreased when compared the prior CT. 4. Areas of subtle relative decreased attenuation in both kidneys, which could be due to pyelonephritis. No evidence of a renal or perirenal abscess. 5. Gallbladder wall thickening that is most likely nonspecific edema related to ascites. 6. No evidence of bowel inflammation. 7. Stable calcified mesenteric nodule. Electronically Signed   By: Lajean Manes M.D.   On: 05/05/2018 07:20     Assessment/Plan: transaminitis with hyperbilirubinemia = will check indirect bili in addn to his protime/INR to see what his synthetic  function is like. Will also check ammonia level due to confusion. 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 AFB smear results to be out tomorrow. We have sent off his 3rd specimen this afternoon.  Continue to airborne isolation  Malnutrition = please have RD see patient to give assessment and recs. Will start him on TID ensure in addn to meals.  [facility] for Infectious Diseases Cell: [phone] Pager: [phone]  05/06/2018, 11:51 AM

## 2018-05-07 LAB — COMPREHENSIVE METABOLIC PANEL
ALT: 98 U/L — AB (ref 17–63)
AST: 175 U/L — AB (ref 15–41)
Albumin: 1.5 g/dL — ABNORMAL LOW (ref 3.5–5.0)
Alkaline Phosphatase: 541 U/L — ABNORMAL HIGH (ref 38–126)
Anion gap: 10 (ref 5–15)
BUN: 17 mg/dL (ref 6–20)
CHLORIDE: 101 mmol/L (ref 101–111)
CO2: 22 mmol/L (ref 22–32)
CREATININE: 0.83 mg/dL (ref 0.61–1.24)
Calcium: 8.1 mg/dL — ABNORMAL LOW (ref 8.9–10.3)
Glucose, Bld: 132 mg/dL — ABNORMAL HIGH (ref 65–99)
POTASSIUM: 3.4 mmol/L — AB (ref 3.5–5.1)
SODIUM: 133 mmol/L — AB (ref 135–145)
Total Bilirubin: 3.3 mg/dL — ABNORMAL HIGH (ref 0.3–1.2)
Total Protein: 4.1 g/dL — ABNORMAL LOW (ref 6.5–8.1)

## 2018-05-07 LAB — IRON AND TIBC: IRON: 44 ug/dL — AB (ref 45–182)

## 2018-05-07 LAB — CBC WITH DIFFERENTIAL/PLATELET
BASOS PCT: 0 %
Basophils Absolute: 0 10*3/uL (ref 0.0–0.1)
EOS PCT: 1 %
Eosinophils Absolute: 0 10*3/uL (ref 0.0–0.7)
HEMATOCRIT: 35.3 % — AB (ref 39.0–52.0)
HEMOGLOBIN: 11.7 g/dL — AB (ref 13.0–17.0)
Lymphocytes Relative: 5 %
Lymphs Abs: 0.2 10*3/uL — ABNORMAL LOW (ref 0.7–4.0)
MCH: 25.4 pg — ABNORMAL LOW (ref 26.0–34.0)
MCHC: 33.1 g/dL (ref 30.0–36.0)
MCV: 76.6 fL — AB (ref 78.0–100.0)
MONO ABS: 0.1 10*3/uL (ref 0.1–1.0)
MONOS PCT: 3 %
NEUTROS PCT: 91 %
Neutro Abs: 3.8 10*3/uL (ref 1.7–7.7)
Platelets: 35 10*3/uL — ABNORMAL LOW (ref 150–400)
RBC: 4.61 MIL/uL (ref 4.22–5.81)
RDW: 17.2 % — AB (ref 11.5–15.5)
WBC: 4.1 10*3/uL (ref 4.0–10.5)

## 2018-05-07 LAB — VITAMIN B12: Vitamin B-12: 4488 pg/mL — ABNORMAL HIGH (ref 180–914)

## 2018-05-07 LAB — GLUCOSE, CAPILLARY
GLUCOSE-CAPILLARY: 149 mg/dL — AB (ref 65–99)
GLUCOSE-CAPILLARY: 91 mg/dL (ref 65–99)
Glucose-Capillary: 110 mg/dL — ABNORMAL HIGH (ref 65–99)
Glucose-Capillary: 108 mg/dL — ABNORMAL HIGH (ref 65–99)

## 2018-05-07 LAB — FOLATE: FOLATE: 9.5 ng/mL (ref >5.9)

## 2018-05-07 LAB — RETICULOCYTES
RBC.: 4.61 MIL/uL (ref 4.22–5.81)
Retic Count, Absolute: 23.1 10*3/uL (ref 19.0–186.0)
Retic Ct Pct: 0.5 % (ref 0.4–3.1)

## 2018-05-07 LAB — FERRITIN: FERRITIN: 5775 ng/mL — AB (ref 24–336)

## 2018-05-07 LAB — AMMONIA: Ammonia: 31 umol/L (ref 9–35)

## 2018-05-07 MED ORDER — VANCOMYCIN HCL IN DEXTROSE 750-5 MG/150ML-% IV SOLN
750.0000 mg | Freq: Three times a day (TID) | INTRAVENOUS | Status: DC
  Administered 2018-05-07 – 2018-05-08 (×4): 750 mg via INTRAVENOUS
  Filled 2018-05-07 (×6): qty 150

## 2018-05-07 MED ORDER — POTASSIUM CHLORIDE CRYS ER 20 MEQ PO TBCR
40.0000 meq | EXTENDED_RELEASE_TABLET | Freq: Once | ORAL | Status: AC
  Administered 2018-05-07: 40 meq via ORAL
  Filled 2018-05-07: qty 2

## 2018-05-07 NOTE — Progress Notes (Addendum)
[facility] for Infectious Disease    Date of Admission:  05/02/2018   Total days of antibiotics 6        Day 4 LIPE           ID: Jeffrey Hayes is a 67 y.o. male with  Cirrhosis, abdominal sarcoid admitted for fever, chills, ns, 50# weight loss and CT concerning for mTB Principal Problem:   Severe sepsis ([place]) Active Problems:   Diabetes mellitus without complication ([place])   Sarcoidosis   Hypertension   Cavitary pneumonia   Thrombocytopenia ([place])   Hyponatremia   LFTs abnormal   Intractable hiccups    Subjective: Had isolated temp of 101.44F yesterday. Still confused thought to be medication induced  2 smears are negative for AFB  Medications:  . ethambutol  1,500 mg Oral Daily  . feeding supplement (ENSURE ENLIVE)  237 mL Oral TID WC  . insulin aspart  0-5 Units Subcutaneous QHS  . insulin aspart  0-9 Units Subcutaneous TID WC  . isoniazid  300 mg Oral Daily  . levofloxacin  750 mg Oral Daily  . metoprolol tartrate  12.5 mg Oral BID  . pyrazinamide  2,000 mg Oral Daily  . vitamin B-6  50 mg Oral Daily  . sodium chloride flush  3 mL Intravenous Q12H  . sodium chloride  1 g Oral BID WC    Objective: Vital signs in last 24 hours: Temp:  [97.8 F (36.6 C)-101.9 F (38.8 C)] 97.8 F (36.6 C) (06/02 0934) Pulse Rate:  [90-135] 91 (06/02 0934) Resp:  [14-20] 20 (06/02 0934) BP: (108-127)/(60-77) 114/73 (06/02 0934) SpO2:  [97 %-100 %] 100 % (06/02 0934) Physical Exam  Constitutional: He is oriented to person, easily arousable. He appears chronically ill and under-nourished. No distress.  HENT:  Mouth/Throat: Oropharynx is clear and moist. No oropharyngeal exudate.  Cardiovascular: Normal rate, regular rhythm and normal heart sounds. Exam reveals no gallop and no friction rub.  No murmur heard.  Pulmonary/Chest: Effort normal and breath sounds normal. No respiratory distress. He has no wheezes.  Abdominal: Soft. Bowel sounds are normal. He exhibits no  distension. There is no tenderness.  Skin: Skin is warm and dry. No rash noted. No erythema.  Psychiatric: He has a normal mood and affect. His behavior is normal.    Lab Results Recent Labs    05/06/18 0547 05/07/18 0527  WBC 4.5 4.1  HGB 12.2* 11.7*  HCT 36.7* 35.3*  NA 126* 133*  K 3.7 3.4*  CL 96* 101  CO2 21* 22  BUN 15 17  CREATININE 0.85 0.83   Liver Panel Recent Labs    05/06/18 0547 05/07/18 0527  PROT 4.1* 4.1*  ALBUMIN 1.5* 1.5*  AST 237* 175*  ALT 117* 98*  ALKPHOS 394* 541*  BILITOT 4.4* 3.3*    Microbiology: 2 smear negative  1 still pending Studies/Results: No results found.   Assessment/Plan: Despite having 2 smears being negative for AFB, would still keep on isolation. Continue on presumptive treatment for pulmonary mTB.   Will discuss with pulmonary to do bronch on Monday since need to find source of his pulmonary infection Recommend to at midnight to keep npo.  History of cirrhosis =transaminitis and tibil mildly improved today but ALP elevated.ammonia is WNL so unlikely hepatic encephalopathy. Recommend to fractionate vs. Getting RUQ [place]  ams = thought to be med induced (thorazine x 2 days) since he was previously treated for hiccups on admit up until yesterday.  Spoke with dr [place] Pink regarding recs   Dr comer to see tomorrow.  [facility] for Infectious Diseases Cell: [phone] Pager: [phone]  05/07/2018, 9:59 AM

## 2018-05-07 NOTE — Progress Notes (Signed)
[facility] PROGRESS NOTE  Jeffrey Hayes VEL:[id number] DOB: 11-29-1951 DOA: 05/02/2018  PCP: [name], MD  Brief History/Interval Summary: 67 y.o.malewith medical history significant forhypertension, recent diagnosis of diabetes, and sarcoidosis with intra-abdominal manifestations status post recent course of prednisone, presenting to the emergency department for evaluation of poor appetite with weight loss, palpitations, and exertional dyspnea over 3 weeks. CTA of the chest revealed no PE, bilateral infiltrate, upper lobe cavitary lesion consistent with infection/infiltrate. He has hx of incarceration and volunteered in homeless shelter, both decades ago, and recently immunosuppressed with prednisone. Cultures were obtained. Infectious disease and pulmonary was consulted.  Reason for Visit: Cavitary lesions in the lung  Consultants: Pulmonology.  Infectious disease.  Procedures: None yet  Antibiotics: Vancomycin and Zosyn Isoniazid, pyrazinamide, ethambutol Also started on rifampin but was discontinued due to abnormal LFTs Levaquin  Subjective/Interval History: Patient confusion appears to have cleared.  He feels much better this morning.  Continues to have a cough and some shortness of breath.  Did have fever overnight.  Denies any nausea vomiting.  Reports poor appetite.   ROS: Denies any abdominal pain.  Objective:  Vital Signs  Vitals:   05/06/18 2021 05/06/18 2123 05/07/18 0423 05/07/18 0934  BP: 108/60  115/77 114/73  Pulse: (!) 135  90 91  Resp: 14  16 20   Temp:  98.1 F (36.7 C) 98.4 F (36.9 C) 97.8 F (36.6 C)  TempSrc:  Oral  Oral  SpO2: 97%  99% 100%  Weight:      Height:        Intake/Output Summary (Last 24 hours) at 05/07/2018 0935 Last data filed at 05/07/2018 0600 Gross per 24 hour  Intake 3557 ml  Output 0 ml  Net 3557 ml   Filed Weights   05/02/18 2000  Weight: 75.3 kg (166 lb 0.1 oz)    General appearance: Awake alert.  Less  distracted today.  No distress. Resp: Coarse breath sounds bilaterally.  Crackles at the bases.  Occasional wheezing.  No rhonchi.  Normal effort at rest.   Cardio: S1-S2 is normal regular.  No S3-S4.  No rubs murmurs or bruit. GI: Abdomen is soft.  Nontender nondistended.  Bowel sounds are present.  No masses organomegaly. Extremities: No edema Neurologic: Less confused and distracted today.  Oriented to place.  Person.  No obvious focal neurological deficits noted.  Lab Results:  Data Reviewed: I have personally reviewed following labs and imaging studies  CBC: Recent Labs  Lab 05/03/18 0056 05/04/18 0443 05/05/18 0602 05/06/18 0547 05/07/18 0527  WBC 5.8 5.4 4.1 4.5 4.1  NEUTROABS 5.6 5.3 3.9 4.3 3.8  HGB 11.3* 12.7* 12.9* 12.2* 11.7*  HCT 35.4* 39.0 39.6 36.7* 35.3*  MCV 79.2 77.5* 79.2 76.6* 76.6*  PLT 64* 49* 44* 41* 35*    Basic Metabolic Panel: Recent Labs  Lab 05/04/18 0443 05/04/18 0915 05/05/18 0602 05/05/18 0806 05/06/18 0547 05/07/18 0527  NA 130* 131* 124*  --  126* 133*  K 3.5 3.8 3.1*  --  3.7 3.4*  CL 94* 96* 90*  --  96* 101  CO2 23 24 24   --  21* 22  GLUCOSE 90 97 79  --  81 132*  BUN 9 9 14   --  15 17  CREATININE 0.70 0.74 0.96  --  0.85 0.83  CALCIUM 7.8* 7.8* 7.5*  --  7.2* 8.1*  MG  --   --   --  1.8  --   --  GFR: Estimated Creatinine Clearance: 93.2 mL/min (by C-G formula based on SCr of 0.83 mg/dL).  Liver Function Tests: Recent Labs  Lab 05/04/18 0443 05/04/18 0915 05/05/18 0602 05/06/18 0547 05/07/18 0527  AST 208* 207* 205* 237* 175*  ALT 112* 117* 108* 117* 98*  ALKPHOS 254* 259* 243* 394* 541*  BILITOT 1.8* 1.8* 3.4* 4.4* 3.3*  PROT 4.9* 4.7* 4.4* 4.1* 4.1*  ALBUMIN 1.7* 1.8* 1.6* 1.5* 1.5*    CBG: Recent Labs  Lab 05/06/18 0751 05/06/18 1154 05/06/18 1618 05/06/18 2020 05/07/18 0725  GLUCAP 78 135* 111* 96 110*     Recent Results (from the past 240 hour(s))  Culture, blood (routine x 2) Call MD if unable  to obtain prior to antibiotics being given     Status: None (Preliminary result)   Collection Time: 05/03/18 12:58 AM  Result Value Ref Range Status   Specimen Description BLOOD RIGHT HAND  Final   Special Requests   Final    BOTTLES DRAWN AEROBIC AND ANAEROBIC Blood Culture results may not be optimal due to an inadequate volume of blood received in culture bottles   Culture   Final    NO GROWTH 3 DAYS Performed at [facility], [place] [address], [place], [place] [address]    Report Status PENDING  Incomplete  Culture, blood (routine x 2) Call MD if unable to obtain prior to antibiotics being given     Status: None (Preliminary result)   Collection Time: 05/03/18 12:58 AM  Result Value Ref Range Status   Specimen Description BLOOD RIGHT FOREARM  Final   Special Requests   Final    BOTTLES DRAWN AEROBIC AND ANAEROBIC Blood Culture results may not be optimal due to an inadequate volume of blood received in culture bottles   Culture   Final    NO GROWTH 3 DAYS Performed at [facility], [place] [address], [place], [place] [address]    Report Status PENDING  Incomplete  Acid Fast Smear (AFB)     Status: None   Collection Time: 05/03/18 10:39 PM  Result Value Ref Range Status   AFB Specimen Processing Concentration  Final   Acid Fast Smear Negative  Final    Comment: (NOTE) Performed At: [facility] [place], [place] [id number] Rush Farmer MD [id number]    Source (AFB) SPUTUM  Final  Culture, respiratory (NON-Expectorated)     Status: None   Collection Time: 05/03/18 10:39 PM  Result Value Ref Range Status   Specimen Description SPUTUM  Final   Special Requests NONE  Final   Gram Stain   Final    NO WBC SEEN FEW GRAM VARIABLE ROD FEW GRAM POSITIVE COCCI IN PAIRS RARE YEAST    Culture   Final    FEW Consistent with normal respiratory flora. Performed at [facility], [place] [address], [place], [place] [address]    Report Status  05/06/2018 FINAL  Final  Acid Fast Smear (AFB)     Status: None   Collection Time: 05/04/18 11:08 AM  Result Value Ref Range Status   AFB Specimen Processing Concentration  Final   Acid Fast Smear Negative  Final    Comment: (NOTE) Performed At: [facility] [place], [place] [id number] Rush Farmer MD WY:[id number] Performed at [facility], [place] [address], [place], [place] [address]    Source (AFB) SPUTUM  Final      Radiology Studies: No results found.  Medications:  Scheduled: . ethambutol  1,500 mg Oral Daily  . feeding supplement (ENSURE ENLIVE)  237 mL Oral TID WC  . insulin aspart  0-5 Units Subcutaneous QHS  . insulin aspart  0-9 Units Subcutaneous TID WC  . isoniazid  300 mg Oral Daily  . levofloxacin  750 mg Oral Daily  . metoprolol tartrate  12.5 mg Oral BID  . pyrazinamide  2,000 mg Oral Daily  . vitamin B-6  50 mg Oral Daily  . sodium chloride flush  3 mL Intravenous Q12H  . sodium chloride  1 g Oral BID WC   Continuous: . sodium chloride 75 mL/hr at 05/06/18 1706  . piperacillin-tazobactam (ZOSYN)  IV Stopped (05/07/18 7124)  . vancomycin     PYK:DXIPJASNK, HYDROcodone-acetaminophen, ibuprofen, ondansetron **OR** ondansetron (ZOFRAN) IV, senna-docusate  Assessment/Plan:    Cavitary lung lesions/bilateral pneumonia/sepsis CTA of the chest revealed bilateral infiltrates with upper lobe cavitary lesion.  Concern is for tuberculosis.  Infectious disease and pulmonology following.  Patient is on broad-spectrum antibiotic coverage with vancomycin and Zosyn.  Also on antitubercular treatment.  ATT had to be adjusted due to transaminitis.  Rifampin was discontinued.  He was started on Levaquin.  Also on INH, pyrazinamide and ethambutol.  2 AFB smears have been negative.  One more is pending.  Cultures are negative so far.  Sepsis physiology appears to have improved.  No immediate plans for bronchoscopy but could be considered  depending on results of testing ordered so far.  HIV nonreactive.  Further management per ID and pulmonology.  Transaminitis with hyperbilirubinemia AST ALT continue to be elevated though better today than yesterday.  Bilirubin is also better.  Alkaline phosphatase is higher.  Hepatitis panel was negative.  Hepatobiliary system noted to be unremarkable on recent CT scan.  Avoid hepatotoxic agents.  Patient recently started on chlorpromazine for hiccups which could have contributed to abnormal LFTs as well.  This has been discontinued.  Abdomen remains benign.  If LFTs do not improve soon, could consider a right upper quadrant ultrasound as well.  Acute metabolic encephalopathy Most likely due to chlorpromazine.  This has been discontinued.  Mental status appears to be better this morning.  Ammonia level is normal.    Acute hypoxic respiratory failure Most likely secondary to the infectious process.  Continue oxygen.  Bronchodilators as needed.  History of sarcoidosis Patient follows with rheumatology.  He has intra-abdominal manifestations.  Recently completed a course of prednisone.  Not on maintenance dose.  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  Placed on sodium tablets.  Sodium level has improved.  Replace potassium.  Thrombocytopenia Reason for thrombocytopenia not entirely clear.  Possibly due to sepsis.  Could be due to chlorpromazine as well.  This medication has been discontinued.  Avoid heparin products.  No evidence for bleeding.  Could any of the other antitubercular medications the responsible? Counts were 92,000 at admission.  Today's count is 35,000.  Diabetes mellitus type 2 Possibly steroid-induced as this is a recent diagnosis.  Continue to monitor CBGs.  SSI.  Microcytic anemia No evidence for overt bleeding.  Anemia panel is pending.  Intractable hiccups Initiated on Thorazine appears to be causing side effects symptoms of  altered mental status and could also be causing his liver function abnormalities.  Discontinued on 6/1.  Monitor for hiccups.  Essential hypertension Amlodipine is on hold.  Dose of beta-blocker was reduced due to borderline low blood pressures.  Blood pressure has  improved.  Continue to monitor.  DVT Prophylaxis: SCDs    Code Status: Full code Family Communication: No family at bedside Disposition Plan: Management as outlined above.  Mobilize as tolerated.    LOS: 5 days   [facility] Pager [phone] 05/07/2018, 9:35 AM  If 7PM-7AM, please contact night-coverage at [email or web address], password [facility]

## 2018-05-07 NOTE — Progress Notes (Signed)
Pharmacy Antibiotic Note  Jeffrey Hayes is a 67 y.o. male admitted on 05/02/2018 with pneumonia and presumed TB. Pharmacy has been consulted for Vancomycin and Zosyn dosing.   Today is D#6 of Vancomycin and Zosyn. Noted ID on board for work-up for TB - empiric treatment started on 5/30 evening. SCr 0.83, CrCl~90 ml/min - improved. Rifampin replaced with levofloxacin d/t increasing tbili.  Plan: - Continue Vancomycin 750 mg IV Q8H - Continue Zosyn 3.375gm IV Q8H (4 hr inf) - Will continue to follow renal function, culture results, LOT, and antibiotic de-escalation plans   Height: 6\' 2"  (188 cm) Weight: 166 lb 0.1 oz (75.3 kg) IBW/kg (Calculated) : 82.2  Temp (24hrs), Avg:99.5 F (37.5 C), Min:98.1 F (36.7 C), Max:101.9 F (38.8 C)  Recent Labs  Lab 05/02/18 1516 05/02/18 1756 05/03/18 0056 05/04/18 0443 05/04/18 0915 05/05/18 0602 05/06/18 0547 05/07/18 0527  WBC  --   --  5.8 5.4  --  4.1 4.5 4.1  CREATININE  --   --   --  0.70 0.74 0.96 0.85 0.83  LATICACIDVEN 2.27* 2.04* 1.0  --   --   --   --   --     Estimated Creatinine Clearance: 93.2 mL/min (by C-G formula based on SCr of 0.83 mg/dL).    No Known Allergies  Antimicrobials this admission: CTX x 1 5/28 Azithro x 1 5/28 Vanc 5/28>> Zosyn 5/28>> "RIPE" (TB) 5/30 >>  Microbiology results: 5/28 Quantiferon-gold >> neg 5/28 Strep PNA >> neg 5/28 Legionella >> ip 5/29 RCx >> 5/29 BCx >> [id number] 5/29 AFB x 3  Thank you for allowing pharmacy to be a part of this patient's care.  Angus Seller, PharmD Pharmacy Resident Clinical Phone for 05/07/2018 until 3:30pm: [phone] If after 3:30pm, please call main pharmacy at [phone] 05/07/2018 8:30 AM

## 2018-05-08 ENCOUNTER — Inpatient Hospital Stay (HOSPITAL_COMMUNITY): Payer: Medicare Other

## 2018-05-08 DIAGNOSIS — R634 Abnormal weight loss: Secondary | ICD-10-CM

## 2018-05-08 DIAGNOSIS — R740 Nonspecific elevation of levels of transaminase and lactic acid dehydrogenase [LDH]: Secondary | ICD-10-CM

## 2018-05-08 DIAGNOSIS — R188 Other ascites: Secondary | ICD-10-CM

## 2018-05-08 DIAGNOSIS — A159 Respiratory tuberculosis unspecified: Secondary | ICD-10-CM

## 2018-05-08 DIAGNOSIS — R531 Weakness: Secondary | ICD-10-CM

## 2018-05-08 DIAGNOSIS — K7460 Unspecified cirrhosis of liver: Secondary | ICD-10-CM

## 2018-05-08 DIAGNOSIS — Z6821 Body mass index (BMI) 21.0-21.9, adult: Secondary | ICD-10-CM

## 2018-05-08 DIAGNOSIS — R509 Fever, unspecified: Secondary | ICD-10-CM

## 2018-05-08 DIAGNOSIS — E162 Hypoglycemia, unspecified: Secondary | ICD-10-CM

## 2018-05-08 DIAGNOSIS — J189 Pneumonia, unspecified organism: Secondary | ICD-10-CM

## 2018-05-08 DIAGNOSIS — Z87891 Personal history of nicotine dependence: Secondary | ICD-10-CM

## 2018-05-08 LAB — COMPREHENSIVE METABOLIC PANEL
ALK PHOS: 925 U/L — AB (ref 38–126)
ALT: 96 U/L — ABNORMAL HIGH (ref 17–63)
ANION GAP: 10 (ref 5–15)
AST: 174 U/L — ABNORMAL HIGH (ref 15–41)
Albumin: 1.5 g/dL — ABNORMAL LOW (ref 3.5–5.0)
BILIRUBIN TOTAL: 4 mg/dL — AB (ref 0.3–1.2)
BUN: 13 mg/dL (ref 6–20)
CO2: 24 mmol/L (ref 22–32)
Calcium: 8.6 mg/dL — ABNORMAL LOW (ref 8.9–10.3)
Chloride: 105 mmol/L (ref 101–111)
Creatinine, Ser: 0.79 mg/dL (ref 0.61–1.24)
GFR calc Af Amer: >60 mL/min (ref >60)
Glucose, Bld: 94 mg/dL (ref 65–99)
POTASSIUM: 3.5 mmol/L (ref 3.5–5.1)
Sodium: 139 mmol/L (ref 135–145)
TOTAL PROTEIN: 4.5 g/dL — AB (ref 6.5–8.1)

## 2018-05-08 LAB — CULTURE, BLOOD (ROUTINE X 2)
CULTURE: NO GROWTH
Culture: NO GROWTH

## 2018-05-08 LAB — ACID FAST SMEAR (AFB, MYCOBACTERIA): Acid Fast Smear: POSITIVE

## 2018-05-08 LAB — GLUCOSE, CAPILLARY
GLUCOSE-CAPILLARY: 125 mg/dL — AB (ref 65–99)
GLUCOSE-CAPILLARY: 65 mg/dL (ref 65–99)
GLUCOSE-CAPILLARY: 122 mg/dL — AB (ref 65–99)
Glucose-Capillary: 53 mg/dL — ABNORMAL LOW (ref 65–99)
Glucose-Capillary: 193 mg/dL — ABNORMAL HIGH (ref 65–99)
Glucose-Capillary: 91 mg/dL (ref 65–99)

## 2018-05-08 LAB — CBC WITH DIFFERENTIAL/PLATELET
BASOS ABS: 0 10*3/uL (ref 0.0–0.1)
Basophils Relative: 0 %
EOS ABS: 0 10*3/uL (ref 0.0–0.7)
Eosinophils Relative: 0 %
HEMATOCRIT: 39.3 % (ref 39.0–52.0)
Hemoglobin: 13.1 g/dL (ref 13.0–17.0)
LYMPHS ABS: 0.2 10*3/uL — AB (ref 0.7–4.0)
LYMPHS PCT: 4 %
MCH: 25.5 pg — ABNORMAL LOW (ref 26.0–34.0)
MCHC: 33.3 g/dL (ref 30.0–36.0)
MCV: 76.6 fL — ABNORMAL LOW (ref 78.0–100.0)
MONOS PCT: 1 %
Monocytes Absolute: 0 10*3/uL — ABNORMAL LOW (ref 0.1–1.0)
NEUTROS ABS: 4.6 10*3/uL (ref 1.7–7.7)
Neutrophils Relative %: 95 %
Platelets: 26 10*3/uL — CL (ref 150–400)
RBC: 5.13 MIL/uL (ref 4.22–5.81)
RDW: 17.7 % — AB (ref 11.5–15.5)
WBC: 4.8 10*3/uL (ref 4.0–10.5)

## 2018-05-08 LAB — DIC (DISSEMINATED INTRAVASCULAR COAGULATION)PANEL
Fibrinogen: 377 mg/dL (ref 210–475)
Platelets: 25 10*3/uL — CL (ref 150–400)
Smear Review: NONE SEEN
aPTT: 54 seconds — ABNORMAL HIGH (ref 24–36)

## 2018-05-08 LAB — DIC (DISSEMINATED INTRAVASCULAR COAGULATION) PANEL
D DIMER QUANT: 5.97 ug{FEU}/mL — AB (ref 0.00–0.50)
INR: 1.19
PROTHROMBIN TIME: 15 s (ref 11.4–15.2)

## 2018-05-08 LAB — LACTATE DEHYDROGENASE: LDH: 398 U/L — AB (ref 98–192)

## 2018-05-08 LAB — BILIRUBIN, FRACTIONATED(TOT/DIR/INDIR)
BILIRUBIN INDIRECT: 0.5 mg/dL (ref 0.3–0.9)
BILIRUBIN TOTAL: 1.6 mg/dL — AB (ref 0.3–1.2)
Bilirubin, Direct: 1.1 mg/dL — ABNORMAL HIGH (ref 0.1–0.5)

## 2018-05-08 LAB — ACID FAST SMEAR (AFB)

## 2018-05-08 MED ORDER — DEXTROSE 50 % IV SOLN
INTRAVENOUS | Status: AC
  Administered 2018-05-08: 25 mL
  Filled 2018-05-08: qty 50

## 2018-05-08 MED ORDER — DEXTROSE 50 % IV SOLN
25.0000 mL | Freq: Once | INTRAVENOUS | Status: AC
  Administered 2018-05-08: 25 mL via INTRAVENOUS
  Filled 2018-05-08: qty 50

## 2018-05-08 MED ORDER — SODIUM CHLORIDE 1 G PO TABS
1.0000 g | ORAL_TABLET | Freq: Every day | ORAL | Status: DC
  Administered 2018-05-09 – 2018-05-14 (×5): 1 g via ORAL
  Filled 2018-05-08 (×6): qty 1

## 2018-05-08 MED ORDER — DEXTROSE-NACL 5-0.9 % IV SOLN
INTRAVENOUS | Status: DC
  Administered 2018-05-08 – 2018-05-10 (×4): via INTRAVENOUS

## 2018-05-08 NOTE — Progress Notes (Signed)
Name: Jeffrey Hayes MRN: [id number] DOB: Dec 09, 1950    ADMISSION DATE:  05/02/2018 CONSULTATION DATE: 05/03/2018  REFERRING MD : [facility]  CHIEF COMPLAINT: Exertional dyspnea  BRIEF PATIENT DESCRIPTION: 67 year old with progressive dyspnea  SIGNIFICANT EVENTS    STUDIES:  529 CT scan is noted   HISTORY OF PRESENT ILLNESS:   Mr. Jeffrey Hayes is a 67 year old male who has a past medical history that is notable for diabetes, sarcoidosis which was diagnosed via biopsy, history of cirrhosis with recent paracentesis of 4 L of clear fluid obtained.  He presents with development of weight loss loss of appetite and exertional dyspnea.  He does report coughing up sputum but denies hemoptysis.  He was evaluated by [facility] hospitalist CT scan was performed which shows extensive cavitary pneumonia and infiltrates.  Pulmonary critical care has been consulted to evaluate.  Radiographic findings make tuberculosis strong candidate.  He will have sputum was induced for AFB but this is not sufficient he will may require bronchoscopy in the future.  Note that he has been evaluated for HIV.  SUBJECTIVE:  No events overnight, no new complaints, hungry  VITAL SIGNS: Temp:  [97.9 F (36.6 C)-100.4 F (38 C)] 98.9 F (37.2 C) (06/03 0601) Pulse Rate:  [95-159] 99 (06/03 0830) Resp:  [19-20] 20 (06/03 0830) BP: (118-127)/(79-86) 119/79 (06/03 0830) SpO2:  [94 %-100 %] 99 % (06/03 0830)  PHYSICAL EXAMINATION: General:  Chronically ill appearing male, NAD Neuro:  Awake and interactive, moving all ext to command HEENT:  [place]/AT, PERRL, EOM-I and MMM Cardiovascular:  RRR, Nl S1/S2 and -M/R/G Lungs:  Decreased BS diffusely Abdomen:  Soft, NT, ND and +BS Musculoskeletal:  -edema and -tenderness Skin:  Intact  Recent Labs  Lab 05/06/18 0547 05/07/18 0527 05/08/18 0755  NA 126* 133* 139  K 3.7 3.4* 3.5  CL 96* 101 105  CO2 21* 22 24  BUN 15 17 13   CREATININE 0.85 0.83 0.79  GLUCOSE 81 132* 94   Recent  Labs  Lab 05/06/18 0547 05/07/18 0527 05/08/18 0755  HGB 12.2* 11.7* 13.1  HCT 36.7* 35.3* 39.3  WBC 4.5 4.1 4.8  PLT 41* 35* 25*  26*   No results found.  I reviewed chest CT myself, cavitary lesions noted in both upper lobs  ASSESSMENT Principal Problem:   Cavitary pneumonia Active Problems:   Diabetes mellitus without complication ([place])   Sarcoidosis   Hypertension   Thrombocytopenia (HCC)   Hyponatremia   LFTs abnormal  Cirrhosis   Discussion: 67 year old male with previous history of incarceration and doing community service in homeless shelter, as well as being a reformed alcoholic with cirrhosis and smoker who presents to the hospital with SOB, fever, lethargy and weight loss.  Patient was in his normal state of health until 4 months ago when he was diagnosed with sarcoidosis through an omental biopsy during his evaluation for liver failure.  Patient was started on 40 mg of prednisone daily for 1 month then dropped to 35 mg PO daily x1 month then 30 mg daily.  He stopped taking his prednisone 2 wks ago after felling unwell as above.  Also reports fever x2 months, SOB and wt loss of 37 lbs in 4 months.  No hemoptysis or contact that he recalls with TB.  Discussed with ID-MD  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 steroids  - Quantiferon gold negative  - HIV negative  - Continue negative pressure til after bronchoscopy 05/09/2018 at 1 PM  - AFB  negative x2  - ID requesting BAL, will schedule for tomorrow  Cavitary lesions: TB vs cavitary PNA vs cancer  - Bronch with BAL and cytology in AM  HCAP:  - MTB treatment per ID  - BAL in AM  - Levofloxacin ordered  - Tailor when cultures are available  - ID following  Hypoxemia  - Titrate O2 for sat of 88-92%  Plan on bronch in AM, PCCM will continue to follow.  Rush Farmer, M.D. [facility] Pulmonary/Critical Care Medicine. Pager: [phone]. After hours pager: [phone]

## 2018-05-08 NOTE — Progress Notes (Signed)
CRITICAL VALUE ALERT  Critical Value:  Platelets 26  Date & Time Notied:  05/08/18 0840  Provider Notified: [place] Pink  Orders Received/Actions taken: MD will see patient this morning. Marcille Blanco, RN

## 2018-05-08 NOTE — Progress Notes (Signed)
[facility] for Infectious Disease    Date of Admission:  05/02/2018   Total days of antibiotics 7        Day 5 LIPE           ID: Jeffrey Hayes is a 67 y.o. male with  Cirrhosis, abdominal sarcoid admitted for fever, chills, ns, 50# weight loss and CT concerning for mTB Principal Problem:   Severe sepsis ([place]) Active Problems:   Diabetes mellitus without complication ([place])   Sarcoidosis   Hypertension   Cavitary pneumonia   Thrombocytopenia ([place])   Hyponatremia   LFTs abnormal   Intractable hiccups    Subjective: Reports feeling somewhat better this morning. He reports sweats, nonproductive cough. Denies feeling fevers, shortness of breath, or abdominal pain. He denies any obvious bleeding.  2 smears are negative for AFB, 3rd smear 6/1 is pending.  Medications:  . dextrose  25 mL Intravenous Once  . ethambutol  1,500 mg Oral Daily  . feeding supplement (ENSURE ENLIVE)  237 mL Oral TID WC  . insulin aspart  0-5 Units Subcutaneous QHS  . insulin aspart  0-9 Units Subcutaneous TID WC  . isoniazid  300 mg Oral Daily  . levofloxacin  750 mg Oral Daily  . metoprolol tartrate  12.5 mg Oral BID  . pyrazinamide  2,000 mg Oral Daily  . vitamin B-6  50 mg Oral Daily  . sodium chloride flush  3 mL Intravenous Q12H  . sodium chloride  1 g Oral BID WC    Objective: Vital signs in last 24 hours: Temp:  [97.9 F (36.6 C)-100.4 F (38 C)] 98.9 F (37.2 C) (06/03 0601) Pulse Rate:  [95-159] 99 (06/03 0830) Resp:  [19-20] 20 (06/03 0830) BP: (118-127)/(79-86) 119/79 (06/03 0830) SpO2:  [94 %-100 %] 99 % (06/03 0830) Physical Exam  General: resting in bed, no acute distress HEENT: [place]/AT Cardiac: RRR, no rubs, murmurs or gallops Pulm: fine crackles lower lung fields Abd: soft, nontender, nondistended Ext: warm and well perfused, no pedal edema Neuro: alert and oriented to person and place    Lab Results Recent Labs    05/07/18 0527 05/08/18 0755  WBC 4.1 4.8    HGB 11.7* 13.1  HCT 35.3* 39.3  NA 133* 139  K 3.4* 3.5  CL 101 105  CO2 22 24  BUN 17 13  CREATININE 0.83 0.79   Liver Panel Recent Labs    05/07/18 0527 05/08/18 0755  PROT 4.1* 4.5*  ALBUMIN 1.5* 1.5*  AST 175* 174*  ALT 98* 96*  ALKPHOS 541* 925*  BILITOT 3.3* 4.0*    Microbiology: 2 smear negative  1 still pending Studies/Results: No results found.   Assessment/Plan: Baldomero Mirarchi is a 68 yo male with history of intra-abdominal sarcoidosis on prednisone x 3-4 months, alcoholic liver cirrhosis, T2DM, and chronic thrombocytopenia who presented with high grade fever, chills, night sweats, anorexia, weight loss, andwasfound to have several cavitary lung lesions on bilateral upper lobes concerning for TB.Currently on broad coverage with vancomycin and Zosyn and started on TB treatment empirically (5/30).   AFB smear collected x3; 2 of 3 smears negative, 3rd smear pending HIV nonreactive  HCV total Ab, Hep B surface antigen and antibody, and Hep B core antibody all negative  Strep pneumo antigen negative Blood culture 5/29 with no growth to date   Bilateral Pulmonary Infiltrates with Cavitary Lesions/Presumptive TB: First two smears are negative, 3rd smear is collected and pending. Would keep on isolation  and anticipate bronchoscopy 6/4 for further evaluation of his pulmonary infection. 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 We will discontinue Vancomycin as there is no obvious MRSA infection and also discontinue Zosyn (Levaquin will cover any potential Pseudomonal infection).  History of cirrhosis/transaminitis: Tbili remains elevated and Alk phos rising. RUQ U/S ordered and pending.    Zada Finders, MD Internal Medicine PGY-3  05/08/2018, 12:30 PM

## 2018-05-08 NOTE — Progress Notes (Signed)
[facility] PROGRESS NOTE  Jeffrey Hayes:[id number] DOB: 01-02-1951 DOA: 05/02/2018  PCP: [name], MD  Brief History/Interval Summary: 67 y.o.malewith medical history significant forhypertension, recent diagnosis of diabetes, and sarcoidosis with intra-abdominal manifestations status post recent course of prednisone, presenting to the emergency department for evaluation of poor appetite with weight loss, palpitations, and exertional dyspnea over 3 weeks. CTA of the chest revealed no PE, bilateral infiltrate, upper lobe cavitary lesion consistent with infection/infiltrate. He has hx of incarceration and volunteered in homeless shelter, both decades ago, and recently immunosuppressed with prednisone. Cultures were obtained. Infectious disease and pulmonary was consulted.  Reason for Visit: Cavitary lesions in the lung  Consultants: Pulmonology.  Infectious disease.  Hematology: Dr. Marin Olp  Procedures: None yet  Antibiotics: Vancomycin and Zosyn Isoniazid, pyrazinamide, ethambutol Also started on rifampin but was discontinued due to abnormal LFTs Levaquin  Subjective/Interval History: Patient's wife is at the bedside today.  She states that his confusion is slightly better.  Patient denies any complaints.  Continues to have a cough which is dry.  Denies any shortness of breath.  No chest pain.     ROS: Denies any abdominal pain.  Objective:  Vital Signs  Vitals:   05/07/18 2028 05/07/18 2300 05/08/18 0601 05/08/18 0830  BP: 118/83  127/86 119/79  Pulse: (!) 159 (!) 122 95 99  Resp: 19  19 20   Temp: (!) 100.4 F (38 C) 97.9 F (36.6 C) 98.9 F (37.2 C)   TempSrc: Oral Oral Oral   SpO2: 94%  100% 99%  Weight:      Height:        Intake/Output Summary (Last 24 hours) at 05/08/2018 0954 Last data filed at 05/08/2018 0602 Gross per 24 hour  Intake 2830 ml  Output 1200 ml  Net 1630 ml   Filed Weights   05/02/18 2000  Weight: 75.3 kg (166 lb 0.1 oz)     General appearance: Awake alert.  Still distracted.  No distress. Resp: Continues to have coarse breath sounds bilaterally.  Crackles at the bases.  Somewhat improved air entry compared to 2 days ago.  No wheezing today.  No rhonchi.  Normal effort at rest.    Cardio: S1-S2 is normal regular.  No S3-S4.  No rubs murmurs or bruit GI: Abdomen remains soft.  Nontender nondistended.  Bowel sounds are present.  No masses organomegaly. Extremities: No edema Neurologic: Less confused compared to 2 days ago but still distracted.  Oriented to person place.  Cranial nerves II to XII intact.  No obvious focal neurological deficits.    Lab Results:  Data Reviewed: I have personally reviewed following labs and imaging studies  CBC: Recent Labs  Lab 05/04/18 0443 05/05/18 0602 05/06/18 0547 05/07/18 0527 05/08/18 0755  WBC 5.4 4.1 4.5 4.1 4.8  NEUTROABS 5.3 3.9 4.3 3.8 PENDING  HGB 12.7* 12.9* 12.2* 11.7* 13.1  HCT 39.0 39.6 36.7* 35.3* 39.3  MCV 77.5* 79.2 76.6* 76.6* 76.6*  PLT 49* 44* 41* 35* 25*  26*    Basic Metabolic Panel: Recent Labs  Lab 05/04/18 0915 05/05/18 0602 05/05/18 0806 05/06/18 0547 05/07/18 0527 05/08/18 0755  NA 131* 124*  --  126* 133* 139  K 3.8 3.1*  --  3.7 3.4* 3.5  CL 96* 90*  --  96* 101 105  CO2 24 24  --  21* 22 24  GLUCOSE 97 79  --  81 132* 94  BUN 9 14  --  15 17  13  CREATININE 0.74 0.96  --  0.85 0.83 0.79  CALCIUM 7.8* 7.5*  --  7.2* 8.1* 8.6*  MG  --   --  1.8  --   --   --     GFR: Estimated Creatinine Clearance: 96.7 mL/min (by C-G formula based on SCr of 0.79 mg/dL).  Liver Function Tests: Recent Labs  Lab 05/04/18 0915 05/05/18 0602 05/06/18 0547 05/07/18 0527 05/08/18 0755  AST 207* 205* 237* 175* 174*  ALT 117* 108* 117* 98* 96*  ALKPHOS 259* 243* 394* 541* 925*  BILITOT 1.8* 3.4* 4.4* 3.3* 4.0*  PROT 4.7* 4.4* 4.1* 4.1* 4.5*  ALBUMIN 1.8* 1.6* 1.5* 1.5* 1.5*    CBG: Recent Labs  Lab 05/07/18 0725 05/07/18 1118  05/07/18 1753 05/07/18 2025 05/08/18 0833  GLUCAP 110* 149* 91 108* 125*     Recent Results (from the past 240 hour(s))  Culture, blood (routine x 2) Call MD if unable to obtain prior to antibiotics being given     Status: None (Preliminary result)   Collection Time: 05/03/18 12:58 AM  Result Value Ref Range Status   Specimen Description BLOOD RIGHT HAND  Final   Special Requests   Final    BOTTLES DRAWN AEROBIC AND ANAEROBIC Blood Culture results may not be optimal due to an inadequate volume of blood received in culture bottles   Culture   Final    NO GROWTH 4 DAYS Performed at [facility], [place] [address], [place], [place] [address]    Report Status PENDING  Incomplete  Culture, blood (routine x 2) Call MD if unable to obtain prior to antibiotics being given     Status: None (Preliminary result)   Collection Time: 05/03/18 12:58 AM  Result Value Ref Range Status   Specimen Description BLOOD RIGHT FOREARM  Final   Special Requests   Final    BOTTLES DRAWN AEROBIC AND ANAEROBIC Blood Culture results may not be optimal due to an inadequate volume of blood received in culture bottles   Culture   Final    NO GROWTH 4 DAYS Performed at [facility], [place] [address], [place], [place] [address]    Report Status PENDING  Incomplete  Acid Fast Smear (AFB)     Status: None   Collection Time: 05/03/18 10:39 PM  Result Value Ref Range Status   AFB Specimen Processing Concentration  Final   Acid Fast Smear Negative  Final    Comment: (NOTE) Performed At: [facility] [place], [place] [id number] Rush Farmer MD [id number]    Source (AFB) SPUTUM  Final  Culture, respiratory (NON-Expectorated)     Status: None   Collection Time: 05/03/18 10:39 PM  Result Value Ref Range Status   Specimen Description SPUTUM  Final   Special Requests NONE  Final   Gram Stain   Final    NO WBC SEEN FEW GRAM VARIABLE ROD FEW GRAM POSITIVE COCCI IN PAIRS RARE  YEAST    Culture   Final    FEW Consistent with normal respiratory flora. Performed at [facility], [place] [address] [place], [place] [address]    Report Status 05/06/2018 FINAL  Final  Acid Fast Smear (AFB)     Status: None   Collection Time: 05/04/18 11:08 AM  Result Value Ref Range Status   AFB Specimen Processing Concentration  Final   Acid Fast Smear Negative  Final    Comment: (NOTE) Performed At: [facility]  [place], [place] [id number] Rush Farmer MD [id number] Performed at [facility], [place] [address], [place], [place] [address]    Source (AFB) SPUTUM  Final      Radiology Studies: No results found.   Medications:  Scheduled: . ethambutol  1,500 mg Oral Daily  . feeding supplement (ENSURE ENLIVE)  237 mL Oral TID WC  . insulin aspart  0-5 Units Subcutaneous QHS  . insulin aspart  0-9 Units Subcutaneous TID WC  . isoniazid  300 mg Oral Daily  . levofloxacin  750 mg Oral Daily  . metoprolol tartrate  12.5 mg Oral BID  . pyrazinamide  2,000 mg Oral Daily  . vitamin B-6  50 mg Oral Daily  . sodium chloride flush  3 mL Intravenous Q12H  . sodium chloride  1 g Oral BID WC   Continuous: . sodium chloride 75 mL/hr at 05/08/18 0126  . piperacillin-tazobactam (ZOSYN)  IV 3.375 g (05/08/18 0242)  . vancomycin 750 mg (05/08/18 0907)   YTK:ZSWFUXNAT, HYDROcodone-acetaminophen, ibuprofen, ondansetron **OR** ondansetron (ZOFRAN) IV, senna-docusate  Assessment/Plan:    Cavitary lung lesions/bilateral pneumonia/sepsis CTA of the chest revealed bilateral infiltrates with upper lobe cavitary lesion.  Concern is for tuberculosis.  Infectious disease and pulmonology following.  Patient is on broad-spectrum antibiotic coverage with vancomycin and Zosyn.  Also on antitubercular treatment.  ATT had to be adjusted due to transaminitis.  Rifampin was discontinued.  He was started on Levaquin.  Also on INH, pyrazinamide and ethambutol.  2  AFB smears have been negative.  One more is pending.  Cultures are negative so far.  Sepsis physiology appears to have improved.  No immediate plans for bronchoscopy but could be considered depending on results of testing ordered so far.  HIV nonreactive.  Further management per ID and pulmonology.  Transaminitis with hyperbilirubinemia AST and ALT continue to be elevated.  Alkaline phosphatase continues to rise.  Bilirubin is stable.  Hepatitis panel was negative.  Hepatobiliary system noted to be unremarkable on recent CT scan.  Avoid hepatotoxic agents.  Patient recently started on chlorpromazine for hiccups which could have contributed to abnormal LFTs as well.  This was discontinued.  Abdomen remains benign.  We will order a right upper quadrant hepatobiliary ultrasound.  Continue to trend LFTs.  Thrombocytopenia Counts continue to decrease.  Reason for thrombocytopenia not entirely clear.  Possibly due to sepsis.  Could be due to chlorpromazine as well.  This medication has been discontinued.  Avoid heparin products.  He has not receive any heparin products during his hospital stay.  No evidence for bleeding.  Peripheral smear negative for schistocytes.  Discussed with Dr. Marin Olp with hematology who will evaluate patient.    Acute metabolic encephalopathy Most likely due to chlorpromazine.  This has been discontinued.  Mental status stable.  Ammonia level was normal.     Acute hypoxic respiratory failure Most likely secondary to the infectious process.  Continue oxygen maintain sats greater than 92%..  Bronchodilators as needed.  History of sarcoidosis Patient follows with rheumatology.  Apparently he has intra-abdominal manifestations.  Recently completed a course of prednisone.  Not on any disease modifying agent.    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  Placed on sodium tablets.  Sodium level has improved.  Potassium level has improved as  well.  He will be given an additional dose of potassium today.  Could cut back on the oral tablets.  Diabetes mellitus type  2/hyperglycemia Possibly steroid-induced as this is a recent diagnosis.  Episodes of hypoglycemia likely due to poor oral intake.  Change to D5.  Stop insulin for now.   Microcytic anemia No evidence for overt bleeding.  Iron 44.  Ferritin 5007 and 75.  B12 4488.  Folate 9.5.  Reticulocyte count 23.1.  Intractable hiccups Hiccups appears to have resolved.  Patient was started on chlorpromazine with side effects were noted including worsening confusion.  So this medication was discontinued on 6/1.   Essential hypertension Amlodipine is on hold.  Dose of beta-blocker was reduced due to borderline low blood pressures.  Blood pressure has improved.  Continue to monitor.  DVT Prophylaxis: SCDs    Code Status: Full code Family Communication: Discussed with wife at bedside. Disposition Plan: Regimen as outlined above.  Mobilize as tolerated.  Await specialty input.    LOS: 6 days   [facility] Pager [phone] 05/08/2018, 9:54 AM  If 7PM-7AM, please contact night-coverage at [email or web address], password [facility]

## 2018-05-08 NOTE — Consult Note (Signed)
Referral MD  Reason for Referral: Thrombocytopenia; history of sarcoidosis; possible atypical lung infection  Chief Complaint  Patient presents with  . Weakness  : I have been losing weight.  HPI: Jeffrey Hayes is a very nice 67 year old African-American male.  He really cannot give me much history.  He was unaware that he had sarcoidosis.  He apparently had a paracentesis 3 months ago.  He complained of bloating.  He had a CT scan which showed ascites.  There is no splenomegaly.  He had no obvious cirrhosis.  A paracentesis did not show any malignant cells.  He is lost quite a bit of weight.  He says he is lost over 50 pounds in a few months.  He subsequently came to the hospital and was admitted about a week ago.  He was very weak.  He has some shortness of breath.  He had a CT scan done.  This showed changes that were suggestive of a pulmonary process that could include tuberculosis.  When he was admitted, his labs showed a white cell count 11.2.  Hemoglobin 17.2.  Platelet count 96,000.  Had a normal platelet count 3 months ago.  His platelet count has dropped.  On the 31st, his platelet count was 44,000.  White cell count 4.1.  Today, his white cell count is 4.8.  Hemoglobin 13.1.  Platelet count is 26,000.  He has a predominant white cell differential of neutrophils.  His chemical studies show a sodium of 139.  Potassium 3.5.  BUN 0.79.  Calcium 8.6.  Albumin is only 1.5.  His alkaline phosphatase is 925.  SGPT 96 with SGOT of 174.  His bilirubin is 4.  He had iron studies done.  His ferritin was over 5700.  His serum iron was only 44.  His vitamin B12 was almost 4500.  His coagulation studies show a INR of 1.19.  PTT is 54.  His LDH is 400.  Urinalysis was relatively unremarkable.  He has a normal alpha-fetoprotein.  His TSH was normal.  He has been seen by pulmonary medicine.  He has been seen by infectious disease.  We are asked to see him because of the  thrombocytopenia.  I looked at his blood smear.  His red cells appear to be relatively normal in morphology.  I do not see many schistocytes.  He had no nucleated red blood cells.  He may have had a couple target cells.  There is no rouleaux formation.  His white blood cells appeared normal in morphology.  He had no hypersegmented polys.  He had no immature myeloid or lymphoid cells.  Platelets were markedly decreased in number.  Platelets were somewhat large in size.  Platelets appear to be fairly well granulated.  Used to be a heavy drinker he says.  He said he stopped when he is 67 years old.  Used to drink "white lightning."  Used to smoke quite a bit also.  Again, he is totally unaware of a diagnosis of sarcoidosis.  He has no appetite  .  He just does not want to eat much.  Overall, I said his performance status is ECOG 3.  As above     Past Medical History:  Diagnosis Date  . Arthritis   . Diabetes mellitus without complication ([place])   . Hypertension   . Sarcoidosis   :  Past Surgical History:  Procedure Laterality Date  . CHALAZION EXCISION Left 03/07/2013   Procedure: EXCISION OF CYST LOWER LEFT EYELID;  Surgeon:  Myrtha Mantis., MD;  Location: [facility];  Service: Ophthalmology;  Laterality: Left;  . IR PARACENTESIS  01/12/2018  . IR PARACENTESIS  01/20/2018  . IR PARACENTESIS  02/10/2018  . ROTATOR CUFF REPAIR    . SHOULDER SURGERY Left    per pt - had a pin placed.   :   Current Facility-Administered Medications:  .  bisacodyl (DULCOLAX) EC tablet 5 mg, 5 mg, Oral, Daily PRN, Opyd, Timothy S, MD .  dextrose 5 %-0.9 % sodium chloride infusion, , Intravenous, Continuous, Bonnielee Haff, MD, Last Rate: 75 mL/hr at 05/08/18 1307 .  ethambutol (MYAMBUTOL) tablet 1,500 mg, 1,500 mg, Oral, Daily, Carlyle Basques, MD, 1,500 mg at 05/08/18 1750 .  feeding supplement (ENSURE ENLIVE) (ENSURE ENLIVE) liquid 237 mL, 237 mL, Oral, TID WC, Carlyle Basques, MD,  237 mL at 05/08/18 1750 .  HYDROcodone-acetaminophen (NORCO/VICODIN) 5-325 MG per tablet 1-2 tablet, 1-2 tablet, Oral, Q4H PRN, Opyd, Ilene Qua, MD .  ibuprofen (ADVIL,MOTRIN) tablet 600 mg, 600 mg, Oral, Q6H PRN, Shahmehdi, Seyed A, MD, 600 mg at 05/07/18 2050 .  isoniazid (NYDRAZID) tablet 300 mg, 300 mg, Oral, Daily, Carlyle Basques, MD, 300 mg at 05/08/18 1751 .  levofloxacin (LEVAQUIN) tablet 750 mg, 750 mg, Oral, Daily, Carlyle Basques, MD, 750 mg at 05/08/18 0907 .  metoprolol tartrate (LOPRESSOR) tablet 12.5 mg, 12.5 mg, Oral, BID, Bonnielee Haff, MD, 12.5 mg at 05/08/18 0907 .  ondansetron (ZOFRAN) tablet 4 mg, 4 mg, Oral, Q6H PRN **OR** ondansetron (ZOFRAN) injection 4 mg, 4 mg, Intravenous, Q6H PRN, Opyd, Timothy S, MD .  pyrazinamide tablet 2,000 mg, 2,000 mg, Oral, Daily, Carlyle Basques, MD, 2,000 mg at 05/08/18 1751 .  pyridOXINE (VITAMIN B-6) tablet 50 mg, 50 mg, Oral, Daily, Carlyle Basques, MD, 50 mg at 05/08/18 1751 .  senna-docusate (Senokot-S) tablet 1 tablet, 1 tablet, Oral, QHS PRN, Opyd, Timothy S, MD .  sodium chloride flush (NS) 0.9 % injection 3 mL, 3 mL, Intravenous, Q12H, Opyd, Ilene Qua, MD, 3 mL at 05/07/18 0940 .  [START ON 05/09/2018] sodium chloride tablet 1 g, 1 g, Oral, Daily, Bonnielee Haff, MD:  . ethambutol  1,500 mg Oral Daily  . feeding supplement (ENSURE ENLIVE)  237 mL Oral TID WC  . isoniazid  300 mg Oral Daily  . levofloxacin  750 mg Oral Daily  . metoprolol tartrate  12.5 mg Oral BID  . pyrazinamide  2,000 mg Oral Daily  . vitamin B-6  50 mg Oral Daily  . sodium chloride flush  3 mL Intravenous Q12H  . [START ON 05/09/2018] sodium chloride  1 g Oral Daily  :  No Known Allergies:  Family History  Problem Relation Age of Onset  . Lung disease Father   . Lung disease Brother   . Alcohol abuse Brother   . Drug abuse Brother   :  Social History   Socioeconomic History  . Marital status: Married    Spouse name: Not on file  . Number of  children: Not on file  . Years of education: Not on file  . Highest education level: Not on file  Occupational History  . Occupation: Retired  Scientific laboratory technician  . Financial resource strain: Not on file  . Food insecurity:    Worry: Not on file    Inability: Not on file  . Transportation needs:    Medical: Not on file    Non-medical: Not on file  Tobacco Use  . Smoking status:  Former Smoker    Packs/day: 0.75    Years: 50.00    Pack years: 37.50    Types: Cigarettes    Last attempt to quit: 12/06/2016    Years since quitting: 1.4  . Smokeless tobacco: Never Used  Substance and Sexual Activity  . Alcohol use: Not Currently  . Drug use: No  . Sexual activity: Not on file  Lifestyle  . Physical activity:    Days per week: Not on file    Minutes per session: Not on file  . Stress: Not on file  Relationships  . Social connections:    Talks on phone: Not on file    Gets together: Not on file    Attends religious service: Not on file    Active member of club or organization: Not on file    Attends meetings of clubs or organizations: Not on file    Relationship status: Not on file  . Intimate partner violence:    Fear of current or ex partner: Not on file    Emotionally abused: Not on file    Physically abused: Not on file    Forced sexual activity: Not on file  Other Topics Concern  . Not on file  Social History Narrative   Lives with wife.   Retired.   :  Pertinent items are noted in HPI.  Exam: As above Patient Vitals for the past 24 hrs:  BP Temp Temp src Pulse Resp SpO2  05/08/18 1656 132/75 98.2 F (36.8 C) Oral (!) 111 20 99 %  05/08/18 1238 112/78 - - 91 18 98 %  05/08/18 0830 119/79 - - 99 20 99 %  05/08/18 0601 127/86 98.9 F (37.2 C) Oral 95 19 100 %  05/07/18 2300 - 97.9 F (36.6 C) Oral (!) 122 - -  05/07/18 2028 118/83 (!) 100.4 F (38 C) Oral (!) 159 19 94 %     Recent Labs    05/07/18 0527 05/08/18 0755  WBC 4.1 4.8  HGB 11.7* 13.1  HCT  35.3* 39.3  PLT 35* 25*  26*   Recent Labs    05/07/18 0527 05/08/18 0755  NA 133* 139  K 3.4* 3.5  CL 101 105  CO2 22 24  GLUCOSE 132* 94  BUN 17 13  CREATININE 0.83 0.79  CALCIUM 8.1* 8.6*    Blood smear review: See above  Pathology: None    Assessment and Plan: Mr. Sobecki is a 67 year old African-American male.  He has progressive thrombocytopenia.  He has this pulmonary issue.  I do not know if this is an atypical infection or possibly mycobacterial infection or possibly aspergillosis.  I would not think that this is malignancy.  He does have a past history of heavy tobacco use.  I am most interested in the fact that he has ascites.  He has horrible liver function studies.  Yet, his CT scan really is unremarkable for his liver.  I just wonder if there is not some type of infiltrative process in the liver that is not been shown on the CT scan.  I think that he is going to need a bone marrow biopsy.  It is certainly conceivable that he has some type of underlying bone marrow process that could be causing his issue.  I do not see that he is septic.  He is on antibiotics for tuberculosis and also on Levaquin.  I suppose that 1 of these could be causing the problem.  It is  just hard to know which one would be causing him to have progressive thrombocytopenia.  May be he has immune-based thrombocytopenia.  He would be at risk for this given that he has sarcoidosis.  I certainly would not give him any therapy for immune thrombocytopenia unless we have a good handle on the diagnosis.  Again, I have to wonder why he would have ascites, why his albumin is only 1.5, why his bilirubin is 4, why his alkaline phosphatase and SGPT SGOT are markedly elevated??.  He has been tested for HIV, hepatitis B and hepatitis C.  These were negative.  Again, I think that a bone marrow biopsy is clearly indicated in this situation.  I would avoid any heparin on him for right now.  I will follow  along.  I think if he does need a bronchoscopy, he will need to have a platelet transfusion for this.  I spent about 45 minutes with him.  All the time was spent face-to-face with him.  He seems to be very nice.  I just have a feeling that he has a bad problem that we just are not yet uncovered.  Lattie Haw, MD  Jeffrey Hayes 1:5-7

## 2018-05-09 ENCOUNTER — Inpatient Hospital Stay (HOSPITAL_COMMUNITY): Payer: Medicare Other

## 2018-05-09 ENCOUNTER — Encounter (HOSPITAL_COMMUNITY): Payer: Medicare Other

## 2018-05-09 ENCOUNTER — Encounter (HOSPITAL_COMMUNITY)
Admission: EM | Disposition: A | Discharge status: Home / Self Care | Payer: Self-pay | Source: Home / Self Care | Attending: Internal Medicine

## 2018-05-09 DIAGNOSIS — J158 Pneumonia due to other specified bacteria: Secondary | ICD-10-CM

## 2018-05-09 DIAGNOSIS — R0902 Hypoxemia: Secondary | ICD-10-CM

## 2018-05-09 DIAGNOSIS — J984 Other disorders of lung: Secondary | ICD-10-CM

## 2018-05-09 DIAGNOSIS — R748 Abnormal levels of other serum enzymes: Secondary | ICD-10-CM

## 2018-05-09 DIAGNOSIS — D696 Thrombocytopenia, unspecified: Secondary | ICD-10-CM

## 2018-05-09 DIAGNOSIS — J189 Pneumonia, unspecified organism: Secondary | ICD-10-CM

## 2018-05-09 LAB — COMPREHENSIVE METABOLIC PANEL
ALT: 83 U/L — AB (ref 17–63)
AST: 154 U/L — AB (ref 15–41)
Albumin: 1.4 g/dL — ABNORMAL LOW (ref 3.5–5.0)
Alkaline Phosphatase: 1111 U/L — ABNORMAL HIGH (ref 38–126)
Anion gap: 11 (ref 5–15)
BUN: 13 mg/dL (ref 6–20)
CHLORIDE: 105 mmol/L (ref 101–111)
CO2: 20 mmol/L — ABNORMAL LOW (ref 22–32)
CREATININE: 0.84 mg/dL (ref 0.61–1.24)
Calcium: 8.7 mg/dL — ABNORMAL LOW (ref 8.9–10.3)
GFR calc Af Amer: >60 mL/min (ref >60)
Glucose, Bld: 145 mg/dL — ABNORMAL HIGH (ref 65–99)
Potassium: 3.7 mmol/L (ref 3.5–5.1)
Sodium: 136 mmol/L (ref 135–145)
Total Bilirubin: 2.2 mg/dL — ABNORMAL HIGH (ref 0.3–1.2)
Total Protein: 3.7 g/dL — ABNORMAL LOW (ref 6.5–8.1)

## 2018-05-09 LAB — TYPE AND SCREEN
ABO/RH(D): O POS
Antibody Screen: NEGATIVE

## 2018-05-09 LAB — CBC WITH DIFFERENTIAL/PLATELET
Basophils Absolute: 0 10*3/uL (ref 0.0–0.1)
Basophils Relative: 1 %
EOS PCT: 1 %
Eosinophils Absolute: 0 10*3/uL (ref 0.0–0.7)
HEMATOCRIT: 36.2 % — AB (ref 39.0–52.0)
Hemoglobin: 12 g/dL — ABNORMAL LOW (ref 13.0–17.0)
LYMPHS ABS: 0.3 10*3/uL — AB (ref 0.7–4.0)
Lymphocytes Relative: 6 %
MCH: 25.4 pg — AB (ref 26.0–34.0)
MCHC: 33.1 g/dL (ref 30.0–36.0)
MCV: 76.5 fL — ABNORMAL LOW (ref 78.0–100.0)
MONO ABS: 0 10*3/uL — AB (ref 0.1–1.0)
Monocytes Relative: 1 %
Neutro Abs: 4.1 10*3/uL (ref 1.7–7.7)
Neutrophils Relative %: 91 %
Platelets: 32 10*3/uL — ABNORMAL LOW (ref 150–400)
RBC: 4.73 MIL/uL (ref 4.22–5.81)
RDW: 17.1 % — AB (ref 11.5–15.5)
WBC: 4.4 10*3/uL (ref 4.0–10.5)

## 2018-05-09 LAB — GLUCOSE, CAPILLARY
GLUCOSE-CAPILLARY: 86 mg/dL (ref 65–99)
GLUCOSE-CAPILLARY: 205 mg/dL — AB (ref 65–99)
Glucose-Capillary: 109 mg/dL — ABNORMAL HIGH (ref 65–99)
Glucose-Capillary: 101 mg/dL — ABNORMAL HIGH (ref 65–99)

## 2018-05-09 LAB — ABO/RH: ABO/RH(D): O POS

## 2018-05-09 SURGERY — VIDEO BRONCHOSCOPY WITHOUT FLUORO
Anesthesia: Moderate Sedation | Laterality: Bilateral

## 2018-05-09 MED ORDER — PYRAZINAMIDE 500 MG PO TABS
1500.0000 mg | ORAL_TABLET | Freq: Every day | ORAL | Status: DC
  Administered 2018-05-09 – 2018-05-15 (×7): 1500 mg via ORAL
  Filled 2018-05-09 (×7): qty 3

## 2018-05-09 MED ORDER — SODIUM CHLORIDE 0.9 % IV SOLN: Freq: Once | INTRAVENOUS | Status: DC

## 2018-05-09 MED ORDER — ETHAMBUTOL HCL 400 MG PO TABS
1200.0000 mg | ORAL_TABLET | Freq: Every day | ORAL | Status: DC
  Administered 2018-05-09 – 2018-05-15 (×7): 1200 mg via ORAL
  Filled 2018-05-09 (×8): qty 3

## 2018-05-09 NOTE — Progress Notes (Addendum)
Name: Jeffrey Hayes MRN: [id number] DOB: November 06, 1951    ADMISSION DATE:  05/02/2018 CONSULTATION DATE: 05/03/2018  REFERRING MD : [facility]  CHIEF COMPLAINT: Exertional dyspnea  BRIEF PATIENT DESCRIPTION: 67 year old with progressive dyspnea  SIGNIFICANT EVENTS  Admission 05/02/2018  STUDIES:  5/29 CT scan is noted 6/4>> For bronch/ BAL  Cultures: 05/06/2018>> AFB >> Positive   HISTORY OF PRESENT ILLNESS:   Mr. Jeffrey Hayes is a 67 year old male who has a past medical history that is notable for diabetes, sarcoidosis which was diagnosed via biopsy, history of cirrhosis with recent paracentesis of 4 L of clear fluid obtained.  He presents with development of weight loss loss of appetite and exertional dyspnea.  He does report coughing up sputum but denies hemoptysis.  He was evaluated by [facility] hospitalist CT scan was performed which shows extensive cavitary pneumonia and infiltrates.  Pulmonary critical care has been consulted to evaluate.  Radiographic findings make tuberculosis strong candidate.  He will have sputum was induced for AFB but this is not sufficient he will may require bronchoscopy in the future.  Note that he has been evaluated for HIV.  SUBJECTIVE:  States he is doing ok. No complaints this am  No acute events through the night  VITAL SIGNS: Temp:  [98.2 F (36.8 C)-100.7 F (38.2 C)] 99 F (37.2 C) (06/04 0845) Pulse Rate:  [91-147] 105 (06/04 0845) Resp:  [16-20] 18 (06/04 0845) BP: (104-132)/(60-89) 124/60 (06/04 0845) SpO2:  [96 %-100 %] 98 % (06/04 0845)  PHYSICAL EXAMINATION: General:  Thin male supine in bed, wearing nasal oxygen Neuro:  Awake and alert, appropriate, follows commands, MAE x 4, A&O x 3 HEENT:  NCAT, NO LAD, MM Pink and moist Cardiovascular:  S1, S2, RRR, No RMG Lungs:  Diminished in the bases bilaterally, regular rate, No acute distress Abdomen:  ND, NT, soft flat abdomen, BS + Musculoskeletal:  No obvious deformities, low muscle mass  noted Skin:  Warm, dry and intact, no lesions noted.  Recent Labs  Lab 05/07/18 0527 05/08/18 0755 05/09/18 0406  NA 133* 139 136  K 3.4* 3.5 3.7  CL 101 105 105  CO2 22 24 20*  BUN '17 13 13  '$ CREATININE 0.83 0.79 0.84  GLUCOSE 132* 94 145*   Recent Labs  Lab 05/07/18 0527 05/08/18 0755 05/09/18 0406  HGB 11.7* 13.1 12.0*  HCT 35.3* 39.3 36.2*  WBC 4.1 4.8 4.4  PLT 35* 25*  26* 32*   US Abdomen Limited Ruq  Result Date: 05/09/2018 CLINICAL DATA:  Abnormal LFTs EXAM: ULTRASOUND ABDOMEN LIMITED RIGHT UPPER QUADRANT COMPARISON:  CT 05/05/2018 FINDINGS: Gallbladder: Marked gallbladder wall thickening measuring up to 9 mm. No stones or sonographic Murphy's sign. Common bile duct: Diameter: Normal caliber, 4 mm Liver: No focal lesion identified. Within normal limits in parenchymal echogenicity. Portal vein is patent on color Doppler imaging with normal direction of blood flow towards the liver. Also noted is a right pleural effusion and ascites. IMPRESSION: Markedly thickened gallbladder wall. No gallstones visualized. This could be related to liver disease or cholecystitis. Right pleural effusion, mild perihepatic ascites. Electronically Signed   By: Rolm Baptise M.D.   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  Patient was in his normal state of health until 4 months ago when he was diagnosed with sarcoidosis through an omental biopsy during his evaluation for liver failure.  Patient was  started on 40 mg of prednisone daily for 1 month then dropped to 35 mg PO daily x1 month then 30 mg daily.  He stopped taking his prednisone 2 wks ago after felling unwell as above.  Also reports fever x2 months, SOB and wt loss of 37 lbs in 4 months.  No hemoptysis or contact that he recalls with TB.  Discussed with ID-MD. Seen by hematology 6/3  Latent TB risk  post steroid initiation A: Quantiferon negative  AFB Positive  HIV Negative P: Continue negative pressure room   Bronch/ BAL  >> cancelled  ( AFB +) Appreciate ID input Appreciate Hematology Input   TB vs cavitary PNA vs cancer A: As noted above AFB + P: Trend CXR prn MTB Tx per ID  HCAP: P; Follow micro>> de-escalate per sensitivities Trend prn imaging Appreciate ID continued input  Hypoxemia A: Saturations 98% on 2L [place]  Wean oxygen for Saturation goal of  88-92%  Thrombocytopenia A: No active bleeding ? Immune based vs. Medication related vs Liver  mediated  P: Trend CBC/ platelet Replete platelets as needed for bleeding/ procedures Hold heparin Trend LFT's Bone Marrow Biopsy per Hematology   AS AFB is +, will cancel bronch scheduled for today and resume patient diet. I have discussed this with the patient and his family. They verbalized understanding  Magdalen Spatz, AGACNP-BC [place]. Pager: [phone] 05/09/2018 10:00 AM  Attending Note:  67 year old male with previous potential exposure to TB who presents to PCCM after being on steroids for sarcoidosis for 4 months with bilateral upper lobe cavitary lesions that I reviewed myself on CT.  On exam, feeling better with distant BS.  AFB in the sputum is positive.  Discussed with ID-MD and PCCM-NP.  TB: AFB positive  - MTB treatment to be continued  - F/U on sensitivities  - Cancel bronch  Cavitary lesion: higher risk for cancer given history  - CT of the chest in 3 months after treatment of MTB to evaluate  closure  HCAP:  - Levofloxacin, unlikely to be helpful but will defer to ID  Hypoxemia:  - Titrate O2 for sat of 88-92%.  PCCM will sign off, please call back if needed.  Patient seen and examined, agree with above note.  I dictated the care and orders written for this patient under my direction.  Rush Farmer, [place]

## 2018-05-09 NOTE — Consult Note (Signed)
Chief Complaint: Patient was seen in consultation today for bone marrow biopsy Chief Complaint  Patient presents with  . Weakness   at the request of Dr Pearletha Alfred  Supervising Physician: Daryll Brod  Patient Status: [facility] - In-pt  History of Present Illness: Jeffrey Hayes is a 67 y.o. male   Thrombocytopenia Sarcoidosis  New dx Tuberculosis  Progressive SOB Wt loss Ascites   Scheduled for Bone marrow bx in am per Dr Marin Olp    Past Medical History:  Diagnosis Date  . Arthritis   . Diabetes mellitus without complication ([place])   . Hypertension   . Sarcoidosis     Past Surgical History:  Procedure Laterality Date  . CHALAZION EXCISION Left 03/07/2013   Procedure: EXCISION OF CYST LOWER LEFT EYELID;  Surgeon: Myrtha Mantis., MD;  Location: [facility];  Service: Ophthalmology;  Laterality: Left;  . IR PARACENTESIS  01/12/2018  . IR PARACENTESIS  01/20/2018  . IR PARACENTESIS  02/10/2018  . ROTATOR CUFF REPAIR    . SHOULDER SURGERY Left    per pt - had a pin placed.     Allergies: Patient has no known allergies.  Medications: Prior to Admission medications   Medication Sig Start Date End Date Taking? Authorizing Provider  amLODipine (NORVASC) 10 MG tablet Take 10 mg by mouth daily.   Yes [provider]  atorvastatin (LIPITOR) 20 MG tablet Take 20 mg by mouth every evening.    Yes [provider]  glimepiride (AMARYL) 4 MG tablet Take 4 mg by mouth daily. 04/17/18  Yes [provider]  losartan (COZAAR) 25 MG tablet Take 25 mg by mouth daily.   Yes [provider]  metFORMIN (GLUCOPHAGE) 1000 MG tablet Take 1,000 mg by mouth 2 (two) times daily with a meal.   Yes [provider]  metoprolol succinate (TOPROL-XL) 50 MG 24 hr tablet Take 50 mg by mouth daily. Take with or immediately following a meal.   Yes [provider]  predniSONE (DELTASONE) 10 MG tablet Take 10 mg by mouth daily with  breakfast. 02/13/18  Yes [provider]     Family History  Problem Relation Age of Onset  . Lung disease Father   . Lung disease Brother   . Alcohol abuse Brother   . Drug abuse Brother     Social History   Socioeconomic History  . Marital status: Married    Spouse name: Not on file  . Number of children: Not on file  . Years of education: Not on file  . Highest education level: Not on file  Occupational History  . Occupation: Retired  Scientific laboratory technician  . Financial resource strain: Not on file  . Food insecurity:    Worry: Not on file    Inability: Not on file  . Transportation needs:    Medical: Not on file    Non-medical: Not on file  Tobacco Use  . Smoking status: Former Smoker    Packs/day: 0.75    Years: 50.00    Pack years: 37.50    Types: Cigarettes    Last attempt to quit: 12/06/2016    Years since quitting: 1.4  . Smokeless tobacco: Never Used  Substance and Sexual Activity  . Alcohol use: Not Currently  . Drug use: No  . Sexual activity: Not on file  Lifestyle  . Physical activity:    Days per week: Not on file    Minutes per session: Not  on file  . Stress: Not on file  Relationships  . Social connections:    Talks on phone: Not on file    Gets together: Not on file    Attends religious service: Not on file    Active member of club or organization: Not on file    Attends meetings of clubs or organizations: Not on file    Relationship status: Not on file  Other Topics Concern  . Not on file  Social History Narrative   Lives with wife.   Retired.     Review of Systems: A 12 point ROS discussed and pertinent positives are indicated in the HPI above.  All other systems are negative.  Review of Systems  Constitutional: Positive for activity change, appetite change, fatigue and unexpected weight change. Negative for fever.  Respiratory: Positive for shortness of breath.   Gastrointestinal: Positive for abdominal distention and abdominal pain.   Neurological: Positive for weakness.  Psychiatric/Behavioral: Negative for behavioral problems and confusion.    Vital Signs: BP 124/63   Pulse 99   Temp 98.7 F (37.1 C) (Oral)   Resp 18   Ht 6' 2"  (1.88 m)   Wt 166 lb 0.1 oz (75.3 kg)   SpO2 98%   BMI 21.31 kg/m   Physical Exam  Constitutional: He is oriented to person, place, and time.  Cardiovascular: Normal rate and regular rhythm.  Pulmonary/Chest: Effort normal. He has wheezes.  Abdominal: Soft. He exhibits distension.  Musculoskeletal: Normal range of motion.  Neurological: He is alert and oriented to person, place, and time.  Skin: Skin is warm and dry.  Psychiatric: He has a normal mood and affect. His behavior is normal. Judgment and thought content normal.  Nursing note and vitals reviewed.   Imaging: Dg Chest 2 View  Result Date: 05/02/2018 CLINICAL DATA:  67 year old male with cirrhosis. Weakness and loss of appetite. EXAM: CHEST - 2 VIEW COMPARISON:  Chest CT 08/25/2017, radiographs 06/25/2016. FINDINGS: Semi upright AP and lateral views of the chest. Diffuse reticulonodular pulmonary opacity with an upper lobe predominance, and some semi confluent areas of upper lobe involvement. No superimposed pneumothorax or pleural effusion. Mediastinal contours remain normal. Visualized tracheal air column is within normal limits. Chronic postoperative changes to the left scapula. No acute osseous abnormality identified. Negative visible bowel gas pattern. IMPRESSION: Diffuse pulmonary reticulonodular opacity with some mildly confluent areas in the bilateral upper lobes. No pleural effusion. The appearance is nonspecific. Favor acute viral/atypical respiratory infection. Characterization with Chest CT (noncontrast probably would suffice) may be valuable. Electronically Signed   By: Genevie Ann M.D.   On: 05/02/2018 16:30   Ct Angio Chest Pe W And/or Wo Contrast  Result Date: 05/02/2018 CLINICAL DATA:  Three weeks of decreased  appetite and generalized weakness. Shortness of breath with exertion. No fever. EXAM: CT ANGIOGRAPHY CHEST WITH CONTRAST TECHNIQUE: Multidetector CT imaging of the chest was performed using the standard protocol during bolus administration of intravenous contrast. Multiplanar CT image reconstructions and MIPs were obtained to evaluate the vascular anatomy. CONTRAST:  140m ISOVUE-370 IOPAMIDOL (ISOVUE-370) INJECTION 76% COMPARISON:  Chest x-ray May 02, 2018.  Chest CT September 02, 2017 FINDINGS: Cardiovascular: The thoracic aorta demonstrates atherosclerotic change with no aneurysm or dissection. Stairstep artifact is seen, particularly in the lung bases, limiting evaluation for pulmonary emboli. Taking the artifact into account, there is no convincing evidence of pulmonary emboli. Mediastinum/Nodes: The thyroid is normal. There is a prominent subcarinal node which measures 18 mm and  is stable. Shotty nodes in the AP window are stable. There is a prominent right hilar node which was not well assessed on the previous unenhanced study. Lungs/Pleura: Central airways are normal. No pneumothorax. Bilateral pulmonary infiltrates consisting of both numerous small nodules and more confluent regions. The infiltrates are most prominent in the upper lobes with cavitary rounded regions as seen on coronal image 74 measuring up to 3.6 cm in the right upper lobe, coronal image 67 in the left apex measuring up to 1.9 cm, and coronal image 71 in the left upper lobe measuring up to 1.9 cm. The right middle lobe and lower lobes are involve as well but less severely. No other acute abnormalities identified within the lung parenchyma. Upper Abdomen: Thickening of the right adrenal gland is stable, likely hyperplasia. Increased attenuation in the fat of the omentum within the left upper quadrant is new and nonspecific. Probable minimal ascites. Musculoskeletal: No chest wall abnormality. No acute or significant osseous findings. Review  of the MIP images confirms the above findings. IMPRESSION: 1. Bilateral pulmonary infiltrates most prominent in the upper lobes with rounded regions of cavitation in the bilateral apices. Findings are most consistent with an infectious or inflammatory process. Atypical infections such as tuberculosis should be considered. Non tuberculous mycobacterial infections and fungal infections are also possible. Malignancy is considered less likely. Inflammatory causes such as sarcoidosis could also present with upper lobe cavitating nodules. There are a few scattered prominent nodes in the mediastinum which are stable. There is a prominent right hilar node which was not well assessed on previous unenhanced imaging. 2. Atherosclerotic change in the thoracic aorta. 3. Evaluation for pulmonary emboli is limited due to artifact but no emboli are identified. Findings called to the patient's ER doctor, Dr. Maryan Rued. Aortic Atherosclerosis (ICD10-I70.0). Electronically Signed   By: Dorise Bullion III M.D   On: 05/02/2018 18:02   Ct Abdomen Pelvis W Contrast  Result Date: 05/05/2018 CLINICAL DATA:  Abdominal pain. Fever. Abscess suspected. Unintentional weight loss. EXAM: CT ABDOMEN AND PELVIS WITH CONTRAST TECHNIQUE: Multidetector CT imaging of the abdomen and pelvis was performed using the standard protocol following bolus administration of intravenous contrast. CONTRAST:  100 mL ISOVUE-300 IOPAMIDOL (ISOVUE-300) INJECTION 61%, 187m OMNIPAQUE IOHEXOL 300 MG/ML SOLN COMPARISON:  01/11/2018 FINDINGS: Lower chest: Lung bases show numerous centrilobular nodular opacities, new since the prior exam. There is a small area patchy ground-glass opacity the posterolateral right middle lobe. Minimal right pleural effusion. There is dependent opacity in the posterior right lower lobe adjacent to the pleural effusion that is most likely atelectasis. Hepatobiliary: Liver normal in size and attenuation with no mass or focal lesion. There is  gallbladder wall edema and pericholecystic fluid. No visible gallstones. No bile duct dilation. Pancreas: Unremarkable. No pancreatic ductal dilatation or surrounding inflammatory changes. Spleen: Normal in size without focal abnormality. Adrenals/Urinary Tract: 2.6 cm right adrenal mass, stable. Normal left adrenal gland. Subtle areas of relative hypoattenuation in both kidneys, most apparent in the lateral midpole of the left kidney. These may reflect areas bright is. There are new since prior exam stable 11 mm lower pole left renal mass consistent with a cyst. No other discrete renal masses. No stones. No hydronephrosis. Ureters are normal course and in caliber. Bladder is unremarkable. Stomach/Bowel: Stomach is unremarkable. No bowel dilation or wall thickening. No convincing inflammation. Normal appendix visualized. Vascular/Lymphatic: No pathologically enlarged lymph nodes. Aortic atherosclerosis. No aneurysm. Reproductive: Unremarkable. Other: Small amount of ascites, less than was present on the  prior CT. There is no defined collection to suggest an abscess. Hazy opacities noted throughout the peritoneal and omental fat similar to the prior exam. Calcified peritoneal soft tissue mass in the left central abdomen adjacent to a loop of small bowel is stable from the prior study. Musculoskeletal: No fracture or acute finding. No osteoblastic or osteolytic lesions. IMPRESSION: 1. No evidence of an abscess. 2. There are new bilateral centrilobular pulmonary nodules. Although the differential diagnosis is relatively broad, since these are new, infection with endobronchial spread such as from non tubercular mycobacteria or aspergillosis is suspected. Hypersensitivity pneumonitis is an alternative diagnosis. This may be the source of fever. 3. Ascites, decreased when compared the prior CT. 4. Areas of subtle relative decreased attenuation in both kidneys, which could be due to pyelonephritis. No evidence of a renal  or perirenal abscess. 5. Gallbladder wall thickening that is most likely nonspecific edema related to ascites. 6. No evidence of bowel inflammation. 7. Stable calcified mesenteric nodule. Electronically Signed   By: Lajean Manes M.D.   On: 05/05/2018 07:20   US Abdomen Limited Ruq  Result Date: 05/09/2018 CLINICAL DATA:  Abnormal LFTs EXAM: ULTRASOUND ABDOMEN LIMITED RIGHT UPPER QUADRANT COMPARISON:  CT 05/05/2018 FINDINGS: Gallbladder: Marked gallbladder wall thickening measuring up to 9 mm. No stones or sonographic Murphy's sign. Common bile duct: Diameter: Normal caliber, 4 mm Liver: No focal lesion identified. Within normal limits in parenchymal echogenicity. Portal vein is patent on color Doppler imaging with normal direction of blood flow towards the liver. Also noted is a right pleural effusion and ascites. IMPRESSION: Markedly thickened gallbladder wall. No gallstones visualized. This could be related to liver disease or cholecystitis. Right pleural effusion, mild perihepatic ascites. Electronically Signed   By: Rolm Baptise M.D.   On: 05/09/2018 08:31    Labs:  CBC: Recent Labs    05/06/18 0547 05/07/18 0527 05/08/18 0755 05/09/18 0406  WBC 4.5 4.1 4.8 4.4  HGB 12.2* 11.7* 13.1 12.0*  HCT 36.7* 35.3* 39.3 36.2*  PLT 41* 35* 25*  26* 32*    COAGS: Recent Labs    01/25/18 1012 05/08/18 0755  INR 1.15 1.19  APTT  --  54*    BMP: Recent Labs    05/06/18 0547 05/07/18 0527 05/08/18 0755 05/09/18 0406  NA 126* 133* 139 136  K 3.7 3.4* 3.5 3.7  CL 96* 101 105 105  CO2 21* 22 24 20*  GLUCOSE 81 132* 94 145*  BUN 15 17 13 13   CALCIUM 7.2* 8.1* 8.6* 8.7*  CREATININE 0.85 0.83 0.79 0.84  GFRNONAA >60 >60 >60 >60  GFRAA >60 >60 >60 >60    LIVER FUNCTION TESTS: Recent Labs    05/06/18 0547 05/07/18 0527 05/08/18 0755 05/08/18 1846 05/09/18 0406  BILITOT 4.4* 3.3* 4.0* 1.6* 2.2*  AST 237* 175* 174*  --  154*  ALT 117* 98* 96*  --  83*  ALKPHOS 394* 541* 925*   --  1,111*  PROT 4.1* 4.1* 4.5*  --  3.7*  ALBUMIN 1.5* 1.5* 1.5*  --  1.4*    TUMOR MARKERS: No results for input(s): AFPTM, CEA, CA199, CHROMGRNA in the last 8760 hours.  Assessment and Plan:  Sarcoidosis +Tuberculosis Thrombocytopenia Scheduled for bone marrow bx in am Risks and benefits discussed with the patient including, but not limited to bleeding, infection, damage to adjacent structures or low yield requiring additional tests.  All of the patient's questions were answered, patient is agreeable to proceed. Consent signed and  in chart.   Thank you for this interesting consult.  I greatly enjoyed meeting Jeffrey Hayes and look forward to participating in their care.  A copy of this report was sent to the requesting provider on this date.  Electronically Signed: Lavonia Drafts, PA-C 05/09/2018, 11:56 AM   I spent a total of 20 Minutes    in face to face in clinical consultation, greater than 50% of which was counseling/coordinating care for Bone marrow bx

## 2018-05-09 NOTE — Progress Notes (Signed)
Jeffrey Hayes for Infectious Disease   Reason for visit: Follow up on pneumonia  Interval History: he now has one smear positive for 2+ AFB; WBC wnl, no fever, no chills or new complaints.  No associated abdominal pain or rash. Wife at bedside; some cough still  CT chest independently reviewed and has extensive upper lobe disease  Physical Exam: Constitutional:  Vitals:   05/09/18 0845 05/09/18 1030  BP: 124/60 124/63  Pulse: (!) 105 99  Resp: 18 18  Temp: 99 F (37.2 C) 98.7 F (37.1 C)  SpO2: 98% 98%   patient appears in NAD Eyes: anicteric HENT: no thrush; + nasal cannula Respiratory: Normal respiratory effort; CTA B Cardiovascular: RRR GI: soft, nt, nd  Review of Systems: Constitutional: negative for sweats Gastrointestinal: negative for diarrhea Integument/breast: negative for rash Hematologic/lymphatic: negative for lymphadenopathy  Lab Results  Component Value Date   WBC 4.4 05/09/2018   HGB 12.0 (L) 05/09/2018   HCT 36.2 (L) 05/09/2018   MCV 76.5 (L) 05/09/2018   PLT 32 (L) 05/09/2018    Lab Results  Component Value Date   CREATININE 0.84 05/09/2018   BUN 13 05/09/2018   NA 136 05/09/2018   K 3.7 05/09/2018   CL 105 05/09/2018   CO2 20 (L) 05/09/2018    Lab Results  Component Value Date   ALT 83 (H) 05/09/2018   AST 154 (H) 05/09/2018   ALKPHOS 1,111 (H) 05/09/2018     Microbiology: Recent Results (from the past 240 hour(s))  Culture, blood (routine x 2) Call MD if unable to obtain prior to antibiotics being given     Status: None   Collection Time: 05/03/18 12:58 AM  Result Value Ref Range Status   Specimen Description BLOOD RIGHT HAND  Final   Special Requests   Final    BOTTLES DRAWN AEROBIC AND ANAEROBIC Blood Culture results may not be optimal due to an inadequate volume of blood received in culture bottles   Culture   Final    NO GROWTH 5 DAYS Performed at [facility], [place] [address], [place], [place] [address]    Report  Status 05/08/2018 FINAL  Final  Culture, blood (routine x 2) Call MD if unable to obtain prior to antibiotics being given     Status: None   Collection Time: 05/03/18 12:58 AM  Result Value Ref Range Status   Specimen Description BLOOD RIGHT FOREARM  Final   Special Requests   Final    BOTTLES DRAWN AEROBIC AND ANAEROBIC Blood Culture results may not be optimal due to an inadequate volume of blood received in culture bottles   Culture   Final    NO GROWTH 5 DAYS Performed at [facility], [place] [address], [place], [place] [address]    Report Status 05/08/2018 FINAL  Final  Acid Fast Smear (AFB)     Status: None   Collection Time: 05/03/18 10:39 PM  Result Value Ref Range Status   AFB Specimen Processing Concentration  Final   Acid Fast Smear Negative  Final    Comment: (NOTE) Performed At: [facility] [address] [place], [place] [id number] Rush Farmer MD [id number]    Source (AFB) SPUTUM  Final  Culture, respiratory (NON-Expectorated)     Status: None   Collection Time: 05/03/18 10:39 PM  Result Value Ref Range Status   Specimen Description SPUTUM  Final   Special Requests NONE  Final   Gram Stain   Final  NO WBC SEEN FEW GRAM VARIABLE ROD FEW GRAM POSITIVE COCCI IN PAIRS RARE YEAST    Culture   Final    FEW Consistent with normal respiratory flora. Performed at [facility], [place] [address], [place], [place] [address]    Report Status 05/06/2018 FINAL  Final  Acid Fast Smear (AFB)     Status: None   Collection Time: 05/04/18 11:08 AM  Result Value Ref Range Status   AFB Specimen Processing Concentration  Final   Acid Fast Smear Negative  Final    Comment: (NOTE) Performed At: [facility] [place], [place] [id number] Rush Farmer MD [id number] Performed at [facility], [place] [address], [place], [place] [address]    Source (AFB) SPUTUM  Final  Acid Fast Smear (AFB)     Status: None   Collection Time:  05/06/18  2:53 PM  Result Value Ref Range Status   AFB Specimen Processing Concentration  Final   Acid Fast Smear Positive  Final    Comment: RESULT CALLED TO, READ BACK BY AND VERIFIED WITH: E CASTRO RN [id number] 05/08/18 A BROWNING (NOTE) 2+, 4-36 acid-fast bacilli per 10 fields at 400X magnification, fluorescent smear REPORTED/ FAXED TO ANGELA B. 06.03.19 AT 1830 -VH Performed At: [facility] [place], [place] [id number] Rush Farmer MD [id number]    Source (AFB) SPUTUM  Final    Comment: Performed at [facility], [place] [address] [place],  [address]    Impression/Plan:  1. AFB positive pneumonia - concern for Tb and he will continue with 4 drug treatment.  I will consider challenging him with rifampin so he can be on full drug therapy.   Discussed with Dr. Nelda Marseille, Dr. [place] Pink IP will contact health department and he will be followed there for treatment after discharge  2.  Increased alk phos - unclear etiology.  [place] with marked wall thickening but no gallstones.  T bili has improved.   3.  Thrombocytopenia - for bone marrow tomorrow by IR. ? Infiltration of Mycobacteria.    4.

## 2018-05-09 NOTE — Progress Notes (Addendum)
[facility] PROGRESS NOTE  Jeffrey Hayes ZYY:[id number] DOB: November 01, 1951 DOA: 05/02/2018  PCP: [name], MD  Brief History/Interval Summary: 67 y.o.malewith medical history significant forhypertension, recent diagnosis of diabetes, and sarcoidosis with intra-abdominal manifestations status post recent course of prednisone, presenting to the emergency department for evaluation of poor appetite with weight loss, palpitations, and exertional dyspnea over 3 weeks. CTA of the chest revealed no PE, bilateral infiltrate, upper lobe cavitary lesion consistent with infection/infiltrate. He has hx of incarceration and volunteered in homeless shelter, both decades ago, and recently immunosuppressed with prednisone. Cultures were obtained. Infectious disease and pulmonary was consulted.  Platelet counts continue to decrease.  Hematology was consulted.  LFTs abnormal but stable.  Reason for Visit: Cavitary lesions in the lung  Consultants:  Pulmonology.   Infectious disease.   Hematology: Dr. Marin Olp  Procedures: Bronchoscopy planned for today.  Antibiotics: Vancomycin and Zosyn stopped on 6/3 Isoniazid, pyrazinamide, ethambutol Also started on rifampin but was discontinued due to abnormal LFTs Levaquin  Subjective/Interval History: Patient states that he is feeling slightly better this morning.  Confusion appears to be improving.  Denies any shortness of breath or cough.  No abdominal pain.  No chest pain.     ROS: Denies any nausea or vomiting  Objective:  Vital Signs  Vitals:   05/09/18 0103 05/09/18 0535 05/09/18 0826 05/09/18 0845  BP: 104/79 121/89 130/80 124/60  Pulse: (!) 147 (!) 114 (!) 119 (!) 105  Resp: _0 Temp: 99.9 F (37.7 C) 98.7 F (37.1 C) 99.2 F (37.3 C) 99 F (37.2 C)  TempSrc: Oral Oral Oral Oral  SpO2: 96% 97% 96% 98%  Weight:      Height:        Intake/Output Summary (Last 24 hours) at 05/09/2018 0928 Last data filed at 05/09/2018  0830 Gross per 24 hour  Intake 1844.25 ml  Output 880 ml  Net 964.25 ml   Filed Weights   05/02/18 2000  Weight: 75.3 kg (166 lb 0.1 oz)    General appearance: Awake alert.  In no distress. Resp: Coarse breath sounds bilaterally.  Few crackles at the bases.  However air entry appears to have improved compared to 2 days ago.  No wheezing.  Normal effort at rest.    Cardio: S1-S2 is normal regular.  No S3-S4.  No rubs murmurs or bruit GI: Abdomen is soft.  Nontender nondistended.  Bowel sounds are present.  No masses organomegaly Extremities: No edema Neurologic: Less confused compared to 3 days ago.  Alert.  Oriented to place month person.  Was a little bit confused about the year.  No focal neurological deficits.    Lab Results:  Data Reviewed: I have personally reviewed following labs and imaging studies  CBC: Recent Labs  Lab 05/05/18 0602 05/06/18 0547 05/07/18 0527 05/08/18 0755 05/09/18 0406  WBC 4.1 4.5 4.1 4.8 4.4  NEUTROABS 3.9 4.3 3.8 4.6 4.1  HGB 12.9* 12.2* 11.7* 13.1 12.0*  HCT 39.6 36.7* 35.3* 39.3 36.2*  MCV 79.2 76.6* 76.6* 76.6* 76.5*  PLT 44* 41* 35* 25*  26* 32*    Basic Metabolic Panel: Recent Labs  Lab 05/05/18 0602 05/05/18 0806 05/06/18 0547 05/07/18 0527 05/08/18 0755 05/09/18 0406  NA 124*  --  126* 133* 139 136  K 3.1*  --  3.7 3.4* 3.5 3.7  CL 90*  --  96* 101 105 105  CO2 24  --  21* 22 24 20*  GLUCOSE 79  --  81 132* 94 145*  BUN 14  --  _0 CREATININE 0.96  --  0.85 0.83 0.79 0.84  CALCIUM 7.5*  --  7.2* 8.1* 8.6* 8.7*  MG  --  1.8  --   --   --   --     GFR: Estimated Creatinine Clearance: 92.1 mL/min (by C-G formula based on SCr of 0.84 mg/dL).  Liver Function Tests: Recent Labs  Lab 05/05/18 0602 05/06/18 0547 05/07/18 0527 05/08/18 0755 05/08/18 1846 05/09/18 0406  AST 205* 237* 175* 174*  --  154*  ALT 108* 117* 98* 96*  --  83*  ALKPHOS 243* 394* 541* 925*  --  1,111*  BILITOT 3.4* 4.4* 3.3* 4.0*  1.6* 2.2*  PROT 4.4* 4.1* 4.1* 4.5*  --  3.7*  ALBUMIN 1.6* 1.5* 1.5* 1.5*  --  1.4*    CBG: Recent Labs  Lab 05/08/18 1211 05/08/18 1237 05/08/18 1659 05/08/18 2103 05/09/18 0736  GLUCAP 53* 193* 91 122* 86     Recent Results (from the past 240 hour(s))  Culture, blood (routine x 2) Call MD if unable to obtain prior to antibiotics being given     Status: None   Collection Time: 05/03/18 12:58 AM  Result Value Ref Range Status   Specimen Description BLOOD RIGHT HAND  Final   Special Requests   Final    BOTTLES DRAWN AEROBIC AND ANAEROBIC Blood Culture results may not be optimal due to an inadequate volume of blood received in culture bottles   Culture   Final    NO GROWTH 5 DAYS Performed at [facility], [place] [address] [place], [place] [address]    Report Status 05/08/2018 FINAL  Final  Culture, blood (routine x 2) Call MD if unable to obtain prior to antibiotics being given     Status: None   Collection Time: 05/03/18 12:58 AM  Result Value Ref Range Status   Specimen Description BLOOD RIGHT FOREARM  Final   Special Requests   Final    BOTTLES DRAWN AEROBIC AND ANAEROBIC Blood Culture results may not be optimal due to an inadequate volume of blood received in culture bottles   Culture   Final    NO GROWTH 5 DAYS Performed at [facility], [place] [address], [place], [place] [address]    Report Status 05/08/2018 FINAL  Final  Acid Fast Smear (AFB)     Status: None   Collection Time: 05/03/18 10:39 PM  Result Value Ref Range Status   AFB Specimen Processing Concentration  Final   Acid Fast Smear Negative  Final    Comment: (NOTE) Performed At: [facility] [place], [place] [id number] Rush Farmer MD [id number]    Source (AFB) SPUTUM  Final  Culture, respiratory (NON-Expectorated)     Status: None   Collection Time: 05/03/18 10:39 PM  Result Value Ref Range Status   Specimen Description SPUTUM  Final   Special Requests NONE   Final   Gram Stain   Final    NO WBC SEEN FEW GRAM VARIABLE ROD FEW GRAM POSITIVE COCCI IN PAIRS RARE YEAST    Culture   Final    FEW Consistent with normal respiratory flora. Performed at [facility], [place] [address] [place], [place] [address]    Report Status 05/06/2018 FINAL  Final  Acid Fast Smear (AFB)     Status: None   Collection Time: 05/04/18 11:08 AM  Result  Value Ref Range Status   AFB Specimen Processing Concentration  Final   Acid Fast Smear Negative  Final    Comment: (NOTE) Performed At: [facility] [place], [place] [id number] Rush Farmer MD [id number] Performed at [facility], [place] [address] [place], [place] [address]    Source (AFB) SPUTUM  Final  Acid Fast Smear (AFB)     Status: None   Collection Time: 05/06/18  2:53 PM  Result Value Ref Range Status   AFB Specimen Processing Concentration  Final   Acid Fast Smear Positive  Final    Comment: RESULT CALLED TO, READ BACK BY AND VERIFIED WITH: E CASTRO RN 2029 05/08/18 A BROWNING (NOTE) 2+, 4-36 acid-fast bacilli per 10 fields at 400X magnification, fluorescent smear REPORTED/ FAXED TO ANGELA B. 06.03.19 AT 1830 -VH Performed At: [facility] [place], [place] [id number] Rush Farmer MD JT:[id number]    Source (AFB) SPUTUM  Final    Comment: Performed at [facility], [place] [address], [place], [place] [address]      Radiology Studies: US Abdomen Limited Ruq  Result Date: 05/09/2018 CLINICAL DATA:  Abnormal LFTs EXAM: ULTRASOUND ABDOMEN LIMITED RIGHT UPPER QUADRANT COMPARISON:  CT 05/05/2018 FINDINGS: Gallbladder: Marked gallbladder wall thickening measuring up to 9 mm. No stones or sonographic Murphy's sign. Common bile duct: Diameter: Normal caliber, 4 mm Liver: No focal lesion identified. Within normal limits in parenchymal echogenicity. Portal vein is patent on color Doppler imaging with normal direction of blood flow towards the  liver. Also noted is a right pleural effusion and ascites. IMPRESSION: Markedly thickened gallbladder wall. No gallstones visualized. This could be related to liver disease or cholecystitis. Right pleural effusion, mild perihepatic ascites. Electronically Signed   By: Rolm Baptise M.D.   On: 05/09/2018 08:31     Medications:  Scheduled: . ethambutol  1,500 mg Oral Daily  . feeding supplement (ENSURE ENLIVE)  237 mL Oral TID WC  . isoniazid  300 mg Oral Daily  . levofloxacin  750 mg Oral Daily  . metoprolol tartrate  12.5 mg Oral BID  . pyrazinamide  2,000 mg Oral Daily  . vitamin B-6  50 mg Oral Daily  . sodium chloride flush  3 mL Intravenous Q12H  . sodium chloride  1 g Oral Daily   Continuous: . sodium chloride    . dextrose 5 % and 0.9% NaCl 75 mL/hr at 05/09/18 0101   RAQ:TMAUQJFHL, HYDROcodone-acetaminophen, ibuprofen, ondansetron **OR** ondansetron (ZOFRAN) IV, senna-docusate  Assessment/Plan:    Cavitary lung lesions/POSITIVE AFB/bilateral pneumonia/sepsis CTA of the chest revealed bilateral infiltrates with upper lobe cavitary lesion.  Concern is for tuberculosis.  Infectious disease and pulmonology following.  Patient is on broad-spectrum antibiotic coverage with vancomycin and Zosyn.  Also on antitubercular treatment.  ATT had to be adjusted due to transaminitis.  Rifampin was discontinued.  He was started on Levaquin.  Also on INH, pyrazinamide and ethambutol.  2 AFB smears have been negative.  The 3rd sample has been read as positive for AFB.  Vancomycin and Zosyn was discontinued.  Sepsis physiology appears to have improved.  Pulmonology was consulted by infectious disease yesterday.  There was plan for bronchoscopy today however in view of the positive third sample this may have to be revisited.  Discussed with Dr. Linus Salmons.  He has called pulmonology to cancel bronchoscopy.  We will reinitiate diet.  HIV nonreactive.  Further management per ID.  Transaminitis with  hyperbilirubinemia  AST and ALT are high but stable.  Alkaline phosphatase continues to rise.  Ultrasound done yesterday did not show any liver findings.  Bile ducts were normal in size.  Some concern was raised about the gallbladder however patient does not have any abdominal pain.  Gallbladder findings are most likely due to other underlying process and ascites.  Hold off on any further biliary work-up at this time.  We will check a GGT level to see if the alkaline phosphatase is high due to liver process or some other process.  Bilirubin has improved.   Hepatitis panel was negative.  Hepatobiliary system noted to be unremarkable on recent CT scan.  Avoid hepatotoxic agents.  Patient recently started on chlorpromazine for hiccups which could have contributed to abnormal LFTs as well.  This was discontinued. Continue to trend LFTs.  Thrombocytopenia Counts continue to decrease.  Reason for thrombocytopenia not entirely clear.  Possibly due to sepsis.  Could be due to chlorpromazine as well, this was given for hiccups and has been discontinued.  Could be due to tuberculosis considering positive AFB.  Appreciate hematology input.  Patient was transfused platelets this morning as the plan was for him to undergo bronchoscopy.  Hematology has ordered bone marrow biopsy.  Avoid heparin products.  He has not receive any heparin products during his hospital stay.  No evidence for bleeding.  Peripheral smear negative for schistocytes.   Acute metabolic encephalopathy Most likely due to chlorpromazine.  This has been discontinued.  Mental status remains stable.  Ammonia level was normal.     Acute hypoxic respiratory failure Most likely secondary to the infectious process.  Continue oxygen maintain sats greater than 92%..  Bronchodilators as needed.  History of sarcoidosis Patient follows with rheumatology.  Apparently he has intra-abdominal manifestations.  Recently completed a course of prednisone.  Not on any  disease modifying agent.  Has been followed by Dr. Michail Sermon with GI.  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  Placed on sodium tablets.  Sodium level has improved.  Potassium level has improved as well.  Dose of the sodium tablets reduced.  Diabetes mellitus type 2/hypoglycemia Hyperglycemia was possibly steroid-induced as this is a recent diagnosis.  2 to have episodes of hypoglycemia in the hospital.  Most likely due to poor oral intake.  He was changed over to D5 IV fluids yesterday.  SSI was discontinued.  Continue to monitor for now.   Microcytic anemia No evidence for overt bleeding.  Iron 44.  Ferritin 5007 and 75.  B12 4488.  Folate 9.5.  Reticulocyte count 23.1.  Intractable hiccups Hiccups appears to have resolved.  Patient was started on chlorpromazine with side effects were noted including worsening confusion.  So this medication was discontinued on 6/1.   Essential hypertension Amlodipine is on hold.  Dose of beta-blocker was reduced due to borderline low blood pressures.  Blood pressure stable.  Continue to monitor.  DVT Prophylaxis: SCDs    Code Status: Full code Family Communication: No family at bedside today. Disposition Plan: Management as outlined above.  Mobilize as tolerated.  PT/OT consult.    LOS: 7 days   [facility] Pager [phone] 05/09/2018, 9:28 AM  If 7PM-7AM, please contact night-coverage at [email or web address], password [facility]

## 2018-05-10 ENCOUNTER — Inpatient Hospital Stay (HOSPITAL_COMMUNITY): Payer: Medicare Other

## 2018-05-10 DIAGNOSIS — A150 Tuberculosis of lung: Secondary | ICD-10-CM | POA: Diagnosis present

## 2018-05-10 DIAGNOSIS — E871 Hypo-osmolality and hyponatremia: Secondary | ICD-10-CM

## 2018-05-10 DIAGNOSIS — R945 Abnormal results of liver function studies: Secondary | ICD-10-CM

## 2018-05-10 DIAGNOSIS — R630 Anorexia: Secondary | ICD-10-CM

## 2018-05-10 LAB — COMPREHENSIVE METABOLIC PANEL
ALBUMIN: 1.5 g/dL — AB (ref 3.5–5.0)
ALT: 72 U/L — AB (ref 17–63)
AST: 124 U/L — ABNORMAL HIGH (ref 15–41)
Alkaline Phosphatase: 963 U/L — ABNORMAL HIGH (ref 38–126)
Anion gap: 4 — ABNORMAL LOW (ref 5–15)
BUN: 10 mg/dL (ref 6–20)
CHLORIDE: 109 mmol/L (ref 101–111)
CO2: 23 mmol/L (ref 22–32)
Calcium: 8.3 mg/dL — ABNORMAL LOW (ref 8.9–10.3)
Creatinine, Ser: 0.78 mg/dL (ref 0.61–1.24)
GFR calc non Af Amer: >60 mL/min (ref >60)
GLUCOSE: 110 mg/dL — AB (ref 65–99)
Potassium: 3.7 mmol/L (ref 3.5–5.1)
SODIUM: 136 mmol/L (ref 135–145)
Total Bilirubin: 1.5 mg/dL — ABNORMAL HIGH (ref 0.3–1.2)
Total Protein: 4.3 g/dL — ABNORMAL LOW (ref 6.5–8.1)

## 2018-05-10 LAB — CBC
HEMATOCRIT: 35 % — AB (ref 39.0–52.0)
Hemoglobin: 11.5 g/dL — ABNORMAL LOW (ref 13.0–17.0)
MCH: 26 pg (ref 26.0–34.0)
MCHC: 32.9 g/dL (ref 30.0–36.0)
MCV: 79 fL (ref 78.0–100.0)
Platelets: 43 10*3/uL — ABNORMAL LOW (ref 150–400)
RBC: 4.43 MIL/uL (ref 4.22–5.81)
RDW: 17.9 % — ABNORMAL HIGH (ref 11.5–15.5)
WBC: 5.7 10*3/uL (ref 4.0–10.5)

## 2018-05-10 LAB — GLUCOSE, CAPILLARY
Glucose-Capillary: 133 mg/dL — ABNORMAL HIGH (ref 65–99)
Glucose-Capillary: 137 mg/dL — ABNORMAL HIGH (ref 65–99)
Glucose-Capillary: 139 mg/dL — ABNORMAL HIGH (ref 65–99)
Glucose-Capillary: 169 mg/dL — ABNORMAL HIGH (ref 65–99)

## 2018-05-10 LAB — ADAMTS13 ACTIVITY REFLEX

## 2018-05-10 LAB — BPAM PLATELET PHERESIS
Blood Product Expiration Date: 201906042359
ISSUE DATE / TIME: 201906040828
UNIT TYPE AND RH: 7300

## 2018-05-10 LAB — PREPARE PLATELET PHERESIS: Unit division: 0

## 2018-05-10 LAB — ADAMTS13 ACTIVITY: Adamts 13 Activity: 46.8 % — ABNORMAL LOW (ref >66.8)

## 2018-05-10 LAB — GAMMA GT: GGT: 201 U/L — ABNORMAL HIGH (ref 7–50)

## 2018-05-10 MED ORDER — FENTANYL CITRATE (PF) 100 MCG/2ML IJ SOLN
INTRAMUSCULAR | Status: AC
  Filled 2018-05-10: qty 4

## 2018-05-10 MED ORDER — SODIUM CHLORIDE 0.9 % IV SOLN
INTRAVENOUS | Status: AC | PRN
  Administered 2018-05-10: 10 mL/h via INTRAVENOUS

## 2018-05-10 MED ORDER — FENTANYL CITRATE (PF) 100 MCG/2ML IJ SOLN
INTRAMUSCULAR | Status: AC | PRN
  Administered 2018-05-10: 50 ug via INTRAVENOUS

## 2018-05-10 MED ORDER — MIDAZOLAM HCL 2 MG/2ML IJ SOLN
INTRAMUSCULAR | Status: AC
  Filled 2018-05-10: qty 4

## 2018-05-10 MED ORDER — MIDAZOLAM HCL 2 MG/2ML IJ SOLN
INTRAMUSCULAR | Status: AC | PRN
  Administered 2018-05-10: 0.5 mg via INTRAVENOUS
  Administered 2018-05-10: 1 mg via INTRAVENOUS

## 2018-05-10 MED ORDER — LIDOCAINE HCL 1 % IJ SOLN
INTRAMUSCULAR | Status: AC
  Filled 2018-05-10: qty 20

## 2018-05-10 MED ORDER — LIDOCAINE HCL 1 % IJ SOLN
10.0000 mL | Freq: Once | INTRAMUSCULAR | Status: DC
  Filled 2018-05-10: qty 10

## 2018-05-10 NOTE — Progress Notes (Signed)
[facility] for Infectious Disease   Date of Admission:  05/02/2018  Antibiotics: Ceftriaxone 5/28 Azithromycin 5/28 Vancomycin 5/28 >> 6/3 Zosyn 5/28 >> 6/3 Rifampin 5/30 Isoniazid 5/30 >> Pyrazinamide 5/30 >> Ethambutol 5/30 >> Levofloxacin 5/31 >>  Subjective: Patient seen after bone marrow biopsy. He appears tired but answering yes/no questions. He denies any fevers, diaphoresis, cough, or difficulty breathing.  Objective:  Vitals:   05/10/18 0900 05/10/18 0933  BP:  132/90  Pulse: (!) 105 (!) 102  Resp:  17  Temp:  98.1 F (36.7 C)  SpO2: 96% 100%   General: resting in bed, lethargic, chronically ill appearing HEENT: [place]/AAT Cardiac: RRR, no rubs, murmurs or gallops Pulm: clear to auscultation anteriorly, moving normal volumes of air Abd: soft, nontender, nondistended Ext: no pedal edema Neuro: lethargic, answering yes/no questions   Lab Results Lab Results  Component Value Date   WBC 4.4 05/09/2018   HGB 12.0 (L) 05/09/2018   HCT 36.2 (L) 05/09/2018   MCV 76.5 (L) 05/09/2018   PLT 32 (L) 05/09/2018    Lab Results  Component Value Date   CREATININE 0.78 05/10/2018   BUN 10 05/10/2018   NA 136 05/10/2018   K 3.7 05/10/2018   CL 109 05/10/2018   CO2 23 05/10/2018    Lab Results  Component Value Date   ALT 72 (H) 05/10/2018   AST 124 (H) 05/10/2018   ALKPHOS 963 (H) 05/10/2018   BILITOT 1.5 (H) 05/10/2018      Microbiology: Recent Results (from the past 240 hour(s))  Culture, blood (routine x 2) Call MD if unable to obtain prior to antibiotics being given     Status: None   Collection Time: 05/03/18 12:58 AM  Result Value Ref Range Status   Specimen Description BLOOD RIGHT HAND  Final   Special Requests   Final    BOTTLES DRAWN AEROBIC AND ANAEROBIC Blood Culture results may not be optimal due to an inadequate volume of blood received in culture bottles   Culture   Final    NO GROWTH 5 DAYS Performed at [facility], [place] [address], [place], [place] [address]    Report Status 05/08/2018 FINAL  Final  Culture, blood (routine x 2) Call MD if unable to obtain prior to antibiotics being given     Status: None   Collection Time: 05/03/18 12:58 AM  Result Value Ref Range Status   Specimen Description BLOOD RIGHT FOREARM  Final   Special Requests   Final    BOTTLES DRAWN AEROBIC AND ANAEROBIC Blood Culture results may not be optimal due to an inadequate volume of blood received in culture bottles   Culture   Final    NO GROWTH 5 DAYS Performed at [facility], [place] [address], [place], [place] [address]    Report Status 05/08/2018 FINAL  Final  Acid Fast Smear (AFB)     Status: None   Collection Time: 05/03/18 10:39 PM  Result Value Ref Range Status   AFB Specimen Processing Concentration  Final   Acid Fast Smear Negative  Final    Comment: (NOTE) Performed At: [facility] [address] [place], [place] [id number] Rush Farmer MD [id number]    Source (AFB) SPUTUM  Final  Culture, respiratory (NON-Expectorated)     Status: None   Collection Time: 05/03/18 10:39 PM  Result Value Ref Range Status   Specimen Description SPUTUM  Final   Special Requests NONE  Final  Gram Stain   Final    NO WBC SEEN FEW GRAM VARIABLE ROD FEW GRAM POSITIVE COCCI IN PAIRS RARE YEAST    Culture   Final    FEW Consistent with normal respiratory flora. Performed at [facility], [place] [address] [place], [place] [address]    Report Status 05/06/2018 FINAL  Final  Acid Fast Smear (AFB)     Status: None   Collection Time: 05/04/18 11:08 AM  Result Value Ref Range Status   AFB Specimen Processing Concentration  Final   Acid Fast Smear Negative  Final    Comment: (NOTE) Performed At: [facility] [place], [place] [id number] Rush Farmer MD [id number] Performed at [facility], [place] [address] [place], [place] [address]    Source (AFB) SPUTUM  Final  Acid Fast  Smear (AFB)     Status: None   Collection Time: 05/06/18  2:53 PM  Result Value Ref Range Status   AFB Specimen Processing Concentration  Final   Acid Fast Smear Positive  Final    Comment: RESULT CALLED TO, READ BACK BY AND VERIFIED WITH: E CASTRO RN 2029 05/08/18 A BROWNING (NOTE) 2+, 4-36 acid-fast bacilli per 10 fields at 400X magnification, fluorescent smear REPORTED/ FAXED TO ANGELA B. 06.03.19 AT 1830 -VH Performed At: [facility] [place], [place] [id number] Rush Farmer MD [id number]    Source (AFB) SPUTUM  Final    Comment: Performed at [facility], [place] [address], [place], [place] [address]    Studies/Results: Dg Chest Port 1 View  Result Date: 05/10/2018 CLINICAL DATA:  Shortness of breath. EXAM: PORTABLE CHEST 1 VIEW COMPARISON:  Chest CT 05/02/2018 and chest radiograph 05/02/2018. FINDINGS: Cardiomediastinal silhouette is normal. Mediastinal contours appear intact. Low lung volumes. Worsening aeration of the lungs with increasing bilateral upper lobe predominant airspace consolidation, and cavitary spaces in the upper lobes. Osseous structures are without acute abnormality. Soft tissues are grossly normal. IMPRESSION: Worsening aeration of the lungs with increasing bilateral upper lobe predominant airspace consolidation, and cavitary spaces in the upper lobes. Electronically Signed   By: Fidela Salisbury M.D.   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 He presents for bone marrow biopsy and culture. EXAM: CT GUIDED BONE MARROW ASPIRATION AND CORE BIOPSY Interventional Radiologist:  Criselda Peaches, MD MEDICATIONS: None. ANESTHESIA/SEDATION: Moderate (conscious) sedation was employed during this procedure. A total of 1.5 milligrams versed and 50 micrograms fentanyl were administered intravenously. The patient's level of  consciousness and vital signs were monitored continuously by radiology nursing throughout the procedure under my direct supervision. Total monitored sedation time: 10 minutes FLUOROSCOPY TIME:  Fluoroscopy Time: 0 minutes 0 seconds (0 mGy). COMPLICATIONS: None immediate. Estimated blood loss: <25 mL PROCEDURE: Informed written consent was obtained from the patient after a thorough discussion of the procedural risks, benefits and alternatives. All questions were addressed. Maximal Sterile Barrier Technique was utilized including caps, mask, sterile gowns, sterile gloves, sterile drape, hand hygiene and skin antiseptic. A timeout was performed prior to the initiation of the procedure. The patient was positioned prone and non-contrast localization CT was performed of the pelvis to demonstrate the iliac marrow spaces. Maximal barrier sterile technique utilized including caps, mask, sterile gowns, sterile gloves, large sterile drape, hand hygiene, and betadine prep. Under sterile conditions and local anesthesia, an 11 gauge coaxial bone biopsy  needle was advanced into the right iliac marrow space. Needle position was confirmed with CT imaging. Initially, bone marrow aspiration was performed. Next, the 11 gauge outer cannula was utilized to obtain a right iliac bone marrow core biopsy. Needle was removed. Hemostasis was obtained with compression. The patient tolerated the procedure well. Samples were prepared with the cytotechnologist. IMPRESSION: Technically successful CT-guided bone marrow biopsy, aspiration and bone culture. Signed, Criselda Peaches, MD [facility] Electronically Signed   By: Jacqulynn Cadet M.D.   On: 05/10/2018 09:53   US Abdomen Limited Ruq  Result Date: 05/09/2018 CLINICAL DATA:  Abnormal LFTs EXAM: ULTRASOUND ABDOMEN LIMITED RIGHT UPPER QUADRANT COMPARISON:  CT 05/05/2018 FINDINGS: Gallbladder: Marked gallbladder wall thickening  measuring up to 9 mm. No stones or sonographic Murphy's sign. Common bile duct: Diameter: Normal caliber, 4 mm Liver: No focal lesion identified. Within normal limits in parenchymal echogenicity. Portal vein is patent on color Doppler imaging with normal direction of blood flow towards the liver. Also noted is a right pleural effusion and ascites. IMPRESSION: Markedly thickened gallbladder wall. No gallstones visualized. This could be related to liver disease or cholecystitis. Right pleural effusion, mild perihepatic ascites. Electronically Signed   By: Rolm Baptise M.D.   On: 05/09/2018 08:31    Assessment/Plan:  AFB Positive Pneumonia: 3rd AFB smear positive, culture is pending. On treatment for presumptive TB. Initially on RIPE therapy, however rifampin was switched to Levofloxacin given his hepatic dysfunction. We will consider rechallenging with Rifampin for full drug therapy.  Elevated Alkaline Phosphatase: RUQ U/S showing marked wall thickening without cholelithiasis. Questionable acalculous cholecystitis vs mycobacterial disease. Alk phos, Tbili, AST/ALT are slowly improving.   Thrombocytopenia: s/p platelet transfusion 6/4. Oncology consulted and following. He is undergoing CT guided bone marrow biopsy this morning. There is concern for bone marrow mycobacterial infection.  Zada Finders, MD Internal Medicine PGY-3 05/10/2018, 10:07 AM

## 2018-05-10 NOTE — Evaluation (Addendum)
Occupational Therapy Evaluation Patient Details Name: Jeffrey Hayes MRN: [id number] DOB: 1951/08/26 Today's Date: 05/10/2018    History of Present Illness 67 y.o. male with medical history significant for hypertension, recent diagnosis of diabetes, and sarcoidosis with intra-abdominal manifestations status post recent course of prednisone, presenting to the emergency department for evaluation of poor appetite with weight loss, palpitations, and exertional dyspnea over 3 weeks. CT scan was performed which shows extensive cavitary pneumonia and infiltrates. Workup being completed for tuberculosis vs Mycobacterium   Clinical Impression   This 67 y/o male presents with the above. At baseline pt is independent with ADLs and functional mobility, reports he lives with spouse. Pt presenting with significant functional weakness and decreased activity tolerance, impacting his overall functional performance. Pt initially requiring ModA+2 for bed mobility and sit<>stand transfers, progressed to minA+2 for sit<>stand as session progressed. Pt completing stand pivot transfers with modA+2; currently requires maxA (+2) for LB and toileting ADLs. Pt very willing to participate with therapy this session with noted improvements throughout as session progressed. Pt will benefit from CIR level services to maximize his overall safety and independence with ADLs and mobility prior to return home. Will continue to follow acutely to progress pt towards established OT goals.    Follow Up Recommendations  CIR;Supervision/Assistance - 24 hour    Equipment Recommendations  Other (comment)(TBD in next venue )    Recommendations for Other Services Rehab consult     Precautions / Restrictions Precautions Precautions: Fall Precaution Comments: airborne precautions Restrictions Weight Bearing Restrictions: No      Mobility Bed Mobility Overal bed mobility: Needs Assistance Bed Mobility: Supine to Sit     Supine to  sit: HOB elevated;Mod assist;+2 for physical assistance;+2 for safety/equipment     General bed mobility comments: assist for LEs over EOB and to support trunk upright into sitting; initially requires assist to gain static sitting balance  Transfers Overall transfer level: Needs assistance Equipment used: 2 person hand held assist Transfers: Sit to/from Stand;Stand Pivot Transfers Sit to Stand: Mod assist;Min assist;+2 physical assistance;+2 safety/equipment Stand pivot transfers: Mod assist;+2 safety/equipment;+2 physical assistance       General transfer comment: initially requiring modA+2 for sit<>stand, progressed to minA+2 during session with assist to rise and control descent, multimodal cues for safe hand placement ; +2 ModA(HHA) for stand pivot transfers including verbal cues/step-by-step instructions for sequencing movement of LEs to take steps during transitions; pt completing stand pivot EOB>recliner and recliner<>BSC     Balance Overall balance assessment: Needs assistance Sitting-balance support: Feet supported;Bilateral upper extremity supported Sitting balance-Leahy Scale: Poor Sitting balance - Comments: pt initially requiring up to modA to maintain static sitting balance due to posterior lean; with multimodal cues and assist pt able to maintain sitting balance with minA, intermittent close minguard  Postural control: Posterior lean Standing balance support: Bilateral upper extremity supported Standing balance-Leahy Scale: Poor Standing balance comment: reliant on UE support at this time                            ADL either performed or assessed with clinical judgement   ADL Overall ADL's : Needs assistance/impaired Eating/Feeding: Minimal assistance;Sitting Eating/Feeding Details (indicate cue type and reason): pt initially requiring assist to bring hand to mouth while holding cup to drink Grooming: Minimal assistance;Sitting   Upper Body Bathing:  Minimal assistance;Sitting   Lower Body Bathing: Moderate assistance;+2 for physical assistance;+2 for safety/equipment;Sit to/from stand   Upper  Body Dressing : Minimal assistance;Sitting   Lower Body Dressing: Maximal assistance;+2 for physical assistance;+2 for safety/equipment;Sit to/from stand   Toilet Transfer: Moderate assistance;+2 for physical assistance;+2 for safety/equipment;Stand-pivot;BSC Toilet Transfer Details (indicate cue type and reason): +2 HHA for steadying, requires step-by-step cues at times to sequence LE movements Toileting- Clothing Manipulation and Hygiene: Maximal assistance;+2 for physical assistance;+2 for safety/equipment;Sit to/from stand Toileting - Clothing Manipulation Details (indicate cue type and reason): assist for peri-care after BM in standing with one therapist providing support in static standing while additional therapist assists with peri-care      Functional mobility during ADLs: Moderate assistance;+2 for physical assistance;+2 for safety/equipment(stand pivot transfers ) General ADL Comments: pt transferred EOB>recliner and recliner<>BSC for toileting task during session; pt limited mostly by weakness and fatigue                          Pertinent Vitals/Pain Pain Assessment: Faces Faces Pain Scale: No hurt Pain Intervention(s): Monitored during session     Hand Dominance Right   Extremity/Trunk Assessment Upper Extremity Assessment Upper Extremity Assessment: Generalized weakness;LUE deficits/detail;RUE deficits/detail RUE Deficits / Details: reports baseline rotator cuff issues limiting his AROM  LUE Deficits / Details: reports baseline rotator cuff issues limiting his AROM    Lower Extremity Assessment Lower Extremity Assessment: Defer to PT evaluation       Communication Communication Communication: No difficulties(intermittently difficult to understand due to mumbled speech and soft spoken)   Cognition  Arousal/Alertness: Awake/alert Behavior During Therapy: WFL for tasks assessed/performed Overall Cognitive Status: No family/caregiver present to determine baseline cognitive functioning                                 General Comments: pt demonstrating delayed processing at times, requires increased time and occasional tactile cues to follow one step commands   General Comments                  Home Living Family/patient expects to be discharged to:: Private residence Living Arrangements: Spouse/significant other;Children Available Help at Discharge: Family Type of Home: House       Home Layout: One level     Bathroom Shower/Tub: Teacher, early years/pre: Standard                Prior Functioning/Environment Level of Independence: Independent                 OT Problem List: Decreased strength;Decreased activity tolerance;Impaired balance (sitting and/or standing);Decreased knowledge of use of DME or AE      OT Treatment/Interventions: Self-care/ADL training;DME and/or AE instruction;Therapeutic activities;Balance training;Therapeutic exercise;Patient/family education;Energy conservation    OT Goals(Current goals can be found in the care plan section) Acute Rehab OT Goals Patient Stated Goal: regain independence OT Goal Formulation: With patient Time For Goal Achievement: 05/24/18 Potential to Achieve Goals: Good  OT Frequency: Min 2X/week   Barriers to D/C:            Co-evaluation PT/OT/SLP Co-Evaluation/Treatment: Yes Reason for Co-Treatment: For patient/therapist safety;To address functional/ADL transfers;Complexity of the patient's impairments (multi-system involvement) PT goals addressed during session: Mobility/safety with mobility OT goals addressed during session: ADL's and self-care;Proper use of Adaptive equipment and DME      AM-PAC PT "6 Clicks" Daily Activity     Outcome Measure Help from another person  eating meals?: A Little Help from another person  taking care of personal grooming?: A Little Help from another person toileting, which includes using toliet, bedpan, or urinal?: A Lot Help from another person bathing (including washing, rinsing, drying)?: A Lot Help from another person to put on and taking off regular upper body clothing?: A Little Help from another person to put on and taking off regular lower body clothing?: A Lot 6 Click Score: 15   End of Session Equipment Utilized During Treatment: Gait belt;Oxygen Nurse Communication: Mobility status  Activity Tolerance: Patient tolerated treatment well Patient left: in chair;with call bell/phone within reach;Other (comment)(with MD present )  OT Visit Diagnosis: Muscle weakness (generalized) (M62.81)                Time: 7897-8478 OT Time Calculation (min): 43 min Charges:  OT General Charges $OT Visit: 1 Visit OT Evaluation $OT Eval Moderate Complexity: 1 Mod G-Codes:     Lou Cal, OT Pager [phone] 05/10/2018   Raymondo Band 05/10/2018, 12:14 PM

## 2018-05-10 NOTE — Evaluation (Signed)
Physical Therapy Evaluation Patient Details Name: Jeffrey Hayes MRN: [id number] DOB: Nov 14, 1951 Today's Date: 05/10/2018   History of Present Illness  67 y.o. male with medical history significant for hypertension, recent diagnosis of diabetes, and sarcoidosis with intra-abdominal manifestations status post recent course of prednisone, presenting to the emergency department for evaluation of poor appetite with weight loss, palpitations, and exertional dyspnea over 3 weeks. CT scan was performed which shows extensive cavitary pneumonia and infiltrates. Workup being completed for tuberculosis vs Mycobacterium  Clinical Impression   Pt admitted with above diagnosis. Pt currently with functional limitations due to the deficits listed below (see PT Problem List). At baseline pt is independent with ADLs and functional mobility, reports he lives with spouse. Pt presenting with significant functional weakness and decreased activity tolerance, impacting his overall functional performance. Pt initially requiring ModA+2 for bed mobility and sit<>stand transfers, progressed to minA+2 for sit<>stand as session progressed. Pt completing stand pivot transfers with modA+2; Recommend CIR for post-acute rehab, but noted they cannot accommodate airborne precautions; Pt will benefit from skilled PT to increase their independence and safety with mobility to allow discharge to the venue listed below.       Follow Up Recommendations CIR ([facility] cannot accommodate airborne precautions, so hopefully TB can be ruled out)    Equipment Recommendations  Rolling walker with 5" wheels;3in1 (PT)    Recommendations for Other Services Rehab consult     Precautions / Restrictions Precautions Precautions: Fall Precaution Comments: airborne precautions Restrictions Weight Bearing Restrictions: No      Mobility  Bed Mobility Overal bed mobility: Needs Assistance Bed Mobility: Supine to Sit     Supine to sit: HOB  elevated;Mod assist;+2 for physical assistance;+2 for safety/equipment     General bed mobility comments: assist for LEs over EOB and to support trunk upright into sitting; initially requires assist to gain static sitting balance  Transfers Overall transfer level: Needs assistance Equipment used: 2 person hand held assist Transfers: Sit to/from Stand;Stand Pivot Transfers Sit to Stand: Mod assist;Min assist;+2 physical assistance;+2 safety/equipment Stand pivot transfers: Mod assist;+2 safety/equipment;+2 physical assistance       General transfer comment: initially requiring modA+2 for sit<>stand, progressed to minA+2 during session with assist to rise and control descent, multimodal cues for safe hand placement ; +2 ModA(HHA) for stand pivot transfers including verbal cues/step-by-step instructions for sequencing movement of LEs to take steps during transitions; pt completing stand pivot EOB>recliner and recliner<>BSC   Ambulation/Gait                Stairs            Wheelchair Mobility    Modified Rankin (Stroke Patients Only)       Balance Overall balance assessment: Needs assistance Sitting-balance support: Feet supported;Bilateral upper extremity supported Sitting balance-Leahy Scale: Poor Sitting balance - Comments: pt initially requiring up to modA to maintain static sitting balance due to posterior lean; with multimodal cues and assist pt able to maintain sitting balance with minA, intermittent close minguard  Postural control: Posterior lean Standing balance support: Bilateral upper extremity supported Standing balance-Leahy Scale: Poor Standing balance comment: reliant on UE support at this time                              Pertinent Vitals/Pain Pain Assessment: Faces Faces Pain Scale: No hurt Pain Intervention(s): Monitored during session    Home Living Family/patient expects to be discharged to::  Private residence Living Arrangements:  Spouse/significant other;Children Available Help at Discharge: Family Type of Home: House       Home Layout: One level        Prior Function Level of Independence: Independent               Hand Dominance   Dominant Hand: Right    Extremity/Trunk Assessment   Upper Extremity Assessment Upper Extremity Assessment: Defer to OT evaluation RUE Deficits / Details: reports baseline rotator cuff issues limiting his AROM  LUE Deficits / Details: reports baseline rotator cuff issues limiting his AROM     Lower Extremity Assessment Lower Extremity Assessment: Generalized weakness       Communication   Communication: No difficulties(intermittently difficult to understand due to mumbled speech and soft spoken)  Cognition Arousal/Alertness: Awake/alert Behavior During Therapy: WFL for tasks assessed/performed Overall Cognitive Status: No family/caregiver present to determine baseline cognitive functioning                                 General Comments: pt demonstrating delayed processing at times, requires increased time and occasional tactile cues to follow one step commands      General Comments      Exercises     Assessment/Plan    PT Assessment Patient needs continued PT services  PT Problem List Decreased strength;Decreased range of motion;Decreased activity tolerance;Decreased balance;Decreased mobility;Decreased coordination;Decreased cognition;Decreased knowledge of use of DME;Decreased safety awareness;Decreased knowledge of precautions;Cardiopulmonary status limiting activity       PT Treatment Interventions DME instruction;Gait training;Stair training;Functional mobility training;Therapeutic activities;Therapeutic exercise;Balance training;Cognitive remediation    PT Goals (Current goals can be found in the Care Plan section)  Acute Rehab PT Goals Patient Stated Goal: regain independence PT Goal Formulation: With patient Time For Goal  Achievement: 05/24/18 Potential to Achieve Goals: Good    Frequency Min 3X/week   Barriers to discharge        Co-evaluation PT/OT/SLP Co-Evaluation/Treatment: Yes Reason for Co-Treatment: For patient/therapist safety PT goals addressed during session: Mobility/safety with mobility OT goals addressed during session: ADL's and self-care       AM-PAC PT "6 Clicks" Daily Activity  Outcome Measure Difficulty turning over in bed (including adjusting bedclothes, sheets and blankets)?: A Lot Difficulty moving from lying on back to sitting on the side of the bed? : A Lot Difficulty sitting down on and standing up from a chair with arms (e.g., wheelchair, bedside commode, etc,.)?: Unable Help needed moving to and from a bed to chair (including a wheelchair)?: A Lot Help needed walking in hospital room?: A Lot Help needed climbing 3-5 steps with a railing? : Total 6 Click Score: 10    End of Session Equipment Utilized During Treatment: Gait belt Activity Tolerance: Patient tolerated treatment well Patient left: in chair;with call bell/phone within reach Nurse Communication: Mobility status PT Visit Diagnosis: Muscle weakness (generalized) (M62.81);Other abnormalities of gait and mobility (R26.89)    Time: 3235-5732 PT Time Calculation (min) (ACUTE ONLY): 44 min   Charges:   PT Evaluation $PT Eval Moderate Complexity: 1 Mod PT Treatments $Therapeutic Activity: 8-22 mins   PT G Codes:        Roney Marion, PT  Acute Rehabilitation Services Pager [phone] Office [phone]   Colletta [place] 05/10/2018, 3:01 PM

## 2018-05-10 NOTE — Progress Notes (Signed)
It looks like we might be dealing with tuberculosis with Mr. Kostelnik.  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  He is on anti-tuberculosis therapy.  Hopefully, with a bone marrow test will be done today or tomorrow.  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  I wonder if his elevated liver tests are from Mycobacterium in the liver.  Surprisingly, his bilirubin is gotten better.  His alkaline phosphatase and other liver tests are also somewhat better.  There is no platelet results back yet.  I am not sure if he was transfused with platelets yesterday.  I think he was supposed to have a bronch but I suspect that given the AFB positivity, that the diagnosis is probable with tuberculosis.  I think a bone marrow test will be incredibly helpful.  Again, would not surprise me if he has extensive granulomatous disease from mycobacterial infection.  We will continue to follow along.  Lattie Haw, MD  [place]brew[place] 4:16

## 2018-05-10 NOTE — Progress Notes (Signed)
PROGRESS NOTE    Jeffrey Hayes  ZTI:[id number] DOB: 1951-08-13 DOA: 05/02/2018 PCP: [name], MD    Brief Narrative:  67 year old male who presented with weight loss malaise and dyspnea on exertion.  He does have the significant past medical history for hypertension, type 2 diabetes mellitus and sarcoidosis with intra-abdominal manifestations.  Status post recent course of prednisone.  Reported 3 weeks of worsening symptoms, associated with productive cough.  On the initial physical examination blood pressure 113/68, heart rate 107, respiratory 26, oxygen saturation 92%.  Moist mucous membranes, lungs with decreased breath sounds bilaterally, mild tachypnea, heart rate 110 bpm, abdomen soft nontender, no lower extremity edema.  Chest CT with bilateral apical cavitary lesions.  Patient was admitted to the hospital with working diagnosis of cavitary pneumonia.  Assessment & Plan:   Principal Problem:   Severe sepsis ([place]) Active Problems:   Diabetes mellitus without complication ([place])   Sarcoidosis   Hypertension   Cavitary pneumonia   Thrombocytopenia ([facility])   Hyponatremia   LFTs abnormal   Intractable hiccups   Community acquired pneumonia   Cavitary lesion of lung   HCAP (healthcare-associated pneumonia)   Hypoxemia   1. 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 In the setting of sarcoidosis and recent steroid exposure. Will continue antibiotic regimen with levofloxacin, isoniazid, ethambutol and pyrazinamide. Patient has been afebrile and oxygenating well, with pulse oxymetry at 100% on room air. Continue physical therapy evaluation, out of bed as tolerated.   2. Elevated liver enzymes and hyperbilirubinemia. AST and ALT continue to improve today down to 124 AST and 72 ALT with total bilirubins down to 1,5.   3. Thrombocytopenia. Likely multifactorial, will continue to follow cell count, had transfusion on 6/3, today at 71. May need bone marrow  biopsy.  4. SIADH.  Stable Na at 136, renal function with serum cr at 0.78, will continue to follow up renal panel in am, on low dose salt tablets.  5. HTN. Will continue blood pressure control with metoprolol 12.5 mg po bid.   6. T2DM. Continue glucose cover and monitoring with insulin sliding scale. Capillary glucose 205, 109, 101, 133, 137.     DVT prophylaxis: enoxaparin   Code Status:  full Family Communication: no family at the bedside  Disposition Plan: CIR   Consultants:   Pulmonary   ID  Procedures:     Antimicrobials:   Levofloxacin   Isoniazid  Pyrazinamide    ethambutol   Subjective: Patient feeling very weak and deconditioned, no chest pain, nausea or vomiting. Positive dyspnea on exertion. History of TB in his father.   Objective: Vitals:   05/10/18 0855 05/10/18 0856 05/10/18 0900 05/10/18 0933  BP: 117/75   132/90  Pulse: (!) 102 (!) 102 (!) 105 (!) 102  Resp:    17  Temp:    98.1 F (36.7 C)  TempSrc:    Oral  SpO2: 96% 97% 96% 100%  Weight:      Height:        Intake/Output Summary (Last 24 hours) at 05/10/2018 1127 Last data filed at 05/10/2018 0600 Gross per 24 hour  Intake 2280 ml  Output 500 ml  Net 1780 ml   Filed Weights   05/02/18 2000 05/09/18 1200 05/09/18 2046  Weight: 75.3 kg (166 lb 0.1 oz) 75.1 kg (165 lb 9.1 oz) 75 kg (165 lb 5.5 oz)    Examination:   General: deconditioned  Neurology: Awake and alert, non focal  E  ENT: mild pallor, no icterus, oral mucosa moist Cardiovascular: No JVD. S1-S2 present, rhythmic, no gallops, rubs, or murmurs. No lower extremity edema. Pulmonary: positive breath sounds bilaterally, adequate air movement, no wheezing, rhonchi or rales. Gastrointestinal. Abdomen with no organomegaly, non tender, no rebound or guarding Skin. No rashes Musculoskeletal: no joint deformities     Data Reviewed: I have personally reviewed following labs and imaging studies  CBC: Recent Labs  Lab  05/05/18 0602 05/06/18 0547 05/07/18 0527 05/08/18 0755 05/09/18 0406  WBC 4.1 4.5 4.1 4.8 4.4  NEUTROABS 3.9 4.3 3.8 4.6 4.1  HGB 12.9* 12.2* 11.7* 13.1 12.0*  HCT 39.6 36.7* 35.3* 39.3 36.2*  MCV 79.2 76.6* 76.6* 76.6* 76.5*  PLT 44* 41* 35* 25*  26* 32*   Basic Metabolic Panel: Recent Labs  Lab 05/05/18 0806 05/06/18 0547 05/07/18 0527 05/08/18 0755 05/09/18 0406 05/10/18 0357  NA  --  126* 133* 139 136 136  K  --  3.7 3.4* 3.5 3.7 3.7  CL  --  96* 101 105 105 109  CO2  --  21* 22 24 20* 23  GLUCOSE  --  81 132* 94 145* 110*  BUN  --  15 17 13 13 10   CREATININE  --  0.85 0.83 0.79 0.84 0.78  CALCIUM  --  7.2* 8.1* 8.6* 8.7* 8.3*  MG 1.8  --   --   --   --   --    GFR: Estimated Creatinine Clearance: 96.4 mL/min (by C-G formula based on SCr of 0.78 mg/dL). Liver Function Tests: Recent Labs  Lab 05/06/18 0547 05/07/18 0527 05/08/18 0755 05/08/18 1846 05/09/18 0406 05/10/18 0357  AST 237* 175* 174*  --  154* 124*  ALT 117* 98* 96*  --  83* 72*  ALKPHOS 394* 541* 925*  --  1,111* 963*  BILITOT 4.4* 3.3* 4.0* 1.6* 2.2* 1.5*  PROT 4.1* 4.1* 4.5*  --  3.7* 4.3*  ALBUMIN 1.5* 1.5* 1.5*  --  1.4* 1.5*   No results for input(s): LIPASE, AMYLASE in the last 168 hours. Recent Labs  Lab 05/07/18 0527  AMMONIA 31   Coagulation Profile: Recent Labs  Lab 05/08/18 0755  INR 1.19   Cardiac Enzymes: No results for input(s): CKTOTAL, CKMB, CKMBINDEX, TROPONINI in the last 168 hours. BNP (last 3 results) No results for input(s): PROBNP in the last 8760 hours. HbA1C: No results for input(s): HGBA1C in the last 72 hours. CBG: Recent Labs  Lab 05/09/18 0736 05/09/18 1226 05/09/18 1625 05/09/18 2046 05/10/18 0931  GLUCAP 86 205* 109* 101* 133*   Lipid Profile: No results for input(s): CHOL, HDL, LDLCALC, TRIG, CHOLHDL, LDLDIRECT in the last 72 hours. Thyroid Function Tests: No results for input(s): TSH, T4TOTAL, FREET4, T3FREE, THYROIDAB in the last 72  hours. Anemia Panel: No results for input(s): VITAMINB12, FOLATE, FERRITIN, TIBC, IRON, RETICCTPCT in the last 72 hours.    Radiology Studies: I have reviewed all of the imaging during this hospital visit personally     Scheduled Meds: . ethambutol  1,200 mg Oral Daily  . feeding supplement (ENSURE ENLIVE)  237 mL Oral TID WC  . fentaNYL      . isoniazid  300 mg Oral Daily  . levofloxacin  750 mg Oral Daily  . lidocaine  10 mL Intradermal Once  . lidocaine      . metoprolol tartrate  12.5 mg Oral BID  . midazolam      . pyrazinamide  1,500 mg Oral Daily  .  vitamin B-6  50 mg Oral Daily  . sodium chloride flush  3 mL Intravenous Q12H  . sodium chloride  1 g Oral Daily   Continuous Infusions: . sodium chloride    . dextrose 5 % and 0.9% NaCl 75 mL/hr at 05/10/18 0442     LOS: 8 days        Timothey Dahlstrom Gerome Apley, MD [facility] Pager [phone]

## 2018-05-10 NOTE — Progress Notes (Signed)
Rehab Admissions Coordinator Note:  Patient was screened by Cleatrice Burke for appropriateness for an Inpatient Acute Rehab Consult per OT prescreen order with OT evaluation in incomplete notes. 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 We are unable to accommodate airborne precautions at this time. Once off precautions, we could consider patient if he fails to progress to d/c home. I will follow.   Cleatrice Burke 05/10/2018, 2:00 PM  I can be reached at [phone].

## 2018-05-11 DIAGNOSIS — Z5181 Encounter for therapeutic drug level monitoring: Secondary | ICD-10-CM

## 2018-05-11 LAB — GLUCOSE, CAPILLARY
GLUCOSE-CAPILLARY: 103 mg/dL — AB (ref 65–99)
Glucose-Capillary: 101 mg/dL — ABNORMAL HIGH (ref 65–99)
Glucose-Capillary: 154 mg/dL — ABNORMAL HIGH (ref 65–99)
Glucose-Capillary: 104 mg/dL — ABNORMAL HIGH (ref 65–99)

## 2018-05-11 LAB — ACID FAST SMEAR (AFB)

## 2018-05-11 LAB — ACID FAST SMEAR (AFB, MYCOBACTERIA): Acid Fast Smear: NEGATIVE

## 2018-05-11 MED ORDER — RIFAMPIN 300 MG PO CAPS
600.0000 mg | ORAL_CAPSULE | Freq: Every day | ORAL | Status: DC
  Administered 2018-05-11 – 2018-05-15 (×5): 600 mg via ORAL
  Filled 2018-05-11 (×6): qty 2

## 2018-05-11 NOTE — Care Management Note (Addendum)
Case Management Note  Patient Details  Name: Jeffrey Hayes MRN: [id number] Date of Birth: December 29, 1950  Subjective/Objective: Pt presented for [facility] and weight-loss. 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 PTA from home with wife. PT/OT recommendations for CIR- unable to accommodate patient with Active TB @ CIR. CM received call from MD and plan will be to transition home with [place] on 05-12-18. Pt will need HH RN, PT orders and F2F. DME orders: BSC and RW. (Not sure if this can be set up).         Action/Plan: CM did call the [facility] Department: Evelena Asa will be the Case Manager for the patient & she can be reached @ [phone]. Pt is on  Ethambutol 1200 mg, Isoniazid 300 mg tablet, and Pyrazinamide 1500 mg tablet. Health Department will provide the medications to the patient once stable to transition home. [place] Agency List provided to patient and family chose [facility] for DME and [facility] RN/PT Services. CM did make referral with Butch Penny with [facility]. Liaison checking with office to make sure they can service the patient and that a staff member has been fit tested. James with [facility] notified for DME RW and 3n1. DME to be delivered to room prior to transition home. Patient uses [facility] [address]/ Pt has Medicare/ Medicaid and Medications cost no more than $1.50- $3.00. No further needs from CM at this time.    Expected Discharge Date:  05/08/18               Expected Discharge Plan:  [place]  In-House Referral:  NA  Discharge planning Services  CM Consult  Post Acute Care Choice:  Durable Medical Equipment, Home Health Choice offered to:  Patient, Spouse  DME Arranged:  3-N-1, Walker rolling DME Agency:  [place]:  RN, Disease Management, PT [place] Agency: N/A  Status of Service:  Completed, signed off  If discussed at [place] of Stay  Meetings, dates discussed:    Additional Comments: 1654 05-11-18 Jacqlyn Krauss, RN,BSN [phone]   Concerns that family needs to be tested prior to transition home-Health Department to make sure family has been tested. CM did reach out to Physician Advisor and Director of Social Work to make them aware of case. No Home Health Services to be set up at this time. Health Department to follow from this point.   1623 05-11-18 Jacqlyn Krauss, RN,BSN [phone] CM did receive call from [place] and they are not able to service the patient. CM did place a call to the wife in regards to [place] not being able to be staffed Advertising account planner). Wife called CM back and is aware that services could not be set up- Wife states that the pt is motivated to move and the family can work with him with strengthening exercises. CM to make MD aware that [place] will not be set up. CM did speak with Cassandra @ the Health Department- MD at the Health Department to contact Dr. Cathlean Sauer in regards to disposition/ medications/ plan of care. CM did call [facility] to see if they could look at the patient. CM will continue to monitor for disposition needs.      [place] 05-11-18 Jacqlyn Krauss, RN,BSN [phone] Butch Penny with [facility] did call back and they do not have staff that has been Fit tested-Unable to accept the referral. CM  then called [facility] to see if they can service the patient. Awaiting call back.  Bethena Roys, RN 05/11/2018, 3:11 PM

## 2018-05-11 NOTE — Care Management Important Message (Signed)
Important Message  Patient Details  Name: HEITH HAIGLER MRN: [id number] Date of Birth: 11-23-1951   Medicare Important Message Given:  Yes    Bethena Roys, RN 05/11/2018, 4:02 PM

## 2018-05-11 NOTE — Progress Notes (Signed)
[place] for Infectious Disease   Reason for visit: Follow up on probable Tb  Interval History: no new cultures, no new events, no complaints, can't go to SNF due to isolation needs; no associated rash  Physical Exam: Constitutional:  Vitals:   05/11/18 0931 05/11/18 1554  BP: 119/88 124/86  Pulse: (!) 117 (!) 114  Resp: 18 18  Temp: 99.2 F (37.3 C)   SpO2: 98% 95%   patient appears in NAD CV: RRR Pulmonary: CTA B; normal respiratory effort GI: soft, nt MS: no edema Skin: no rashes Neuro: non focal HENT: no thrush   Review of Systems: Constitutional: negative for fevers and chills  Lab Results  Component Value Date   WBC 5.7 05/10/2018   HGB 11.5 (L) 05/10/2018   HCT 35.0 (L) 05/10/2018   MCV 79.0 05/10/2018   PLT 43 (L) 05/10/2018    Lab Results  Component Value Date   CREATININE 0.78 05/10/2018   BUN 10 05/10/2018   NA 136 05/10/2018   K 3.7 05/10/2018   CL 109 05/10/2018   CO2 23 05/10/2018    Lab Results  Component Value Date   ALT 72 (H) 05/10/2018   AST 124 (H) 05/10/2018   ALKPHOS 963 (H) 05/10/2018     Microbiology: Recent Results (from the past 240 hour(s))  Culture, blood (routine x 2) Call MD if unable to obtain prior to antibiotics being given     Status: None   Collection Time: 05/03/18 12:58 AM  Result Value Ref Range Status   Specimen Description BLOOD RIGHT HAND  Final   Special Requests   Final    BOTTLES DRAWN AEROBIC AND ANAEROBIC Blood Culture results may not be optimal due to an inadequate volume of blood received in culture bottles   Culture   Final    NO GROWTH 5 DAYS Performed at [facility], [address], [place], [place] [address]    Report Status 05/08/2018 FINAL  Final  Culture, blood (routine x 2) Call MD if unable to obtain prior to antibiotics being given     Status: None   Collection Time: 05/03/18 12:58 AM  Result Value Ref Range Status   Specimen Description BLOOD RIGHT FOREARM  Final   Special Requests   Final    BOTTLES DRAWN AEROBIC AND ANAEROBIC Blood Culture results may not be optimal due to an inadequate volume of blood received in culture bottles   Culture   Final    NO GROWTH 5 DAYS Performed at [facility], [place] [address] [place], [place] [address]    Report Status 05/08/2018 FINAL  Final  Acid Fast Smear (AFB)     Status: None   Collection Time: 05/03/18 10:39 PM  Result Value Ref Range Status   AFB Specimen Processing Concentration  Final   Acid Fast Smear Negative  Final    Comment: (NOTE) Performed At: [facility] [place], [place] [id number] Rush Farmer MD [id number]    Source (AFB) SPUTUM  Final  Culture, respiratory (NON-Expectorated)     Status: None   Collection Time: 05/03/18 10:39 PM  Result Value Ref Range Status   Specimen Description SPUTUM  Final   Special Requests NONE  Final   Gram Stain   Final    NO WBC SEEN FEW GRAM VARIABLE ROD FEW GRAM POSITIVE COCCI IN PAIRS RARE YEAST    Culture   Final    FEW Consistent with normal respiratory  flora. Performed at [facility], [place] [address], [place], [place] [address]    Report Status 05/06/2018 FINAL  Final  Acid Fast Smear (AFB)     Status: None   Collection Time: 05/04/18 11:08 AM  Result Value Ref Range Status   AFB Specimen Processing Concentration  Final   Acid Fast Smear Negative  Final    Comment: (NOTE) Performed At: [facility], [place] [id number] Rush Farmer MD [id number] Performed at [facility], [place] [address] [place], [place] [address]    Source (AFB) SPUTUM  Final  Acid Fast Smear (AFB)     Status: None   Collection Time: 05/06/18  2:53 PM  Result Value Ref Range Status   AFB Specimen Processing Concentration  Final   Acid Fast Smear Positive  Final    Comment: RESULT CALLED TO, READ BACK BY AND VERIFIED WITH: E CASTRO RN 2029 05/08/18 A BROWNING (NOTE) 2+, 4-36 acid-fast  bacilli per 10 fields at 400X magnification, fluorescent smear REPORTED/ FAXED TO ANGELA B. 06.03.19 AT 1830 -VH Performed At: [facility] [place], [place] [id number] Rush Farmer MD [id number]    Source (AFB) SPUTUM  Final    Comment: Performed at [facility], [place] [address] [place], [place] [address]    Impression/Plan:  1. Probable Tb - I will rechallenge him with rifampin and stop levaquin.    2. Medication monitoring - will monitor LFTs for above.    3. Thrombocytopenia - stable.  willl continue to monitor

## 2018-05-11 NOTE — Progress Notes (Addendum)
PROGRESS NOTE    Jeffrey Hayes  HTX:[id number] DOB: [birth year] DOA: 05/02/2018 PCP: [name], MD    Brief Narrative:  67 year old male who presented with weight loss malaise and dyspnea on exertion.  He does have the significant past medical history for hypertension, type 2 diabetes mellitus and sarcoidosis with intra-abdominal manifestations.  Status post recent course of prednisone.  Reported 3 weeks of worsening symptoms, associated with productive cough.  On the initial physical examination blood pressure 113/68, heart rate 107, respiratory 26, oxygen saturation 92%.  Moist mucous membranes, lungs with decreased breath sounds bilaterally, mild tachypnea, heart rate 110 bpm, abdomen soft nontender, no lower extremity edema.  Sodium 126, potassium 4.0, chloride 88, bicarb 26, glucose 166, BUN 11, creatinine 0.99, alkaline phosphatase 158, AST 129, ALT 90, total bilirubin 1.4.  White cell count 8.0, hemoglobin 14.3, hematocrit 43.4, platelets at 92.  Urinalysis with specific gravity 1.020, protein 30.  Drug screen positive for tetrahydrocannabinol.  Chest x-ray with diffuse bilateral interstitial lung markings/reticulonodular.  Chest CT with bilateral apical cavitary lesions.  EKG sinus tachycardia with positive PVCs and PACs.  Patient was admitted to the hospital with the working diagnosis of cavitary pneumonia.   Assessment & Plan:   Principal Problem:   Severe sepsis ([place]) Active Problems:   Diabetes mellitus without complication ([place])   Sarcoidosis   Hypertension   Cavitary pneumonia   Thrombocytopenia ([facility])   Hyponatremia   LFTs abnormal   Intractable hiccups   Community acquired pneumonia   Cavitary lesion of lung   HCAP (healthcare-associated pneumonia)   Hypoxemia   Pulmonary tuberculosis   1. 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 In the setting of sarcoidosis and recent steroid exposure. Antibiotic regimen with levofloxacin,  isoniazid, ethambutol and pyrazinamide, follow with further ID recommendations. Pending result of bone marrow biopsy. Patient very weak and deconditioned, not able to be discharge to snf due to active tb. Will plan for discharge in am.   2. Elevated liver enzymes and hyperbilirubinemia. AST and ALT have been trending down, will check liver panel in am.   3. Thrombocytopenia. Platelets have been improving, will continue to follow cell count in am. No signs of bleeding.   4. SIADH. Patient tolerating po well, will continue to follow on renal panel in am.   5. HTN. Continue with metoprolol 12.5 mg po bid, blood pressure systolic 320 to 233 mmHg.   6. T2DM. Glucose cover and monitoring with insulin sliding scale. Capillary glucose 137, 139, 169, 101, 154.    DVT prophylaxis: enoxaparin   Code Status:  full Family Communication: I spoke with patient's wife at the bedside and all questions were addressed.  Disposition Plan: home in am, with home health. Patient not accepted at CIR due to    Consultants:   Pulmonary   ID  Procedures:     Antimicrobials:   Levofloxacin   Isoniazid  Pyrazinamide    ethambutol     Subjective: Patient with no chest pain or dyspnea, still feeling very weak and deconditioned, out of bed to the chair. No cough or hemoptysis.   Objective: Vitals:   05/10/18 2119 05/11/18 0416 05/11/18 0931 05/11/18 0931  BP: 116/73 128/72 119/88 119/88  Pulse: (!) 102 (!) 105 (!) 117 (!) 117  Resp: 14 15  18   Temp: 99.3 F (37.4 C) 98.4 F (36.9 C)  99.2 F (37.3 C)  TempSrc:    Oral  SpO2: 99% 98%  98%  Weight:  Height:        Intake/Output Summary (Last 24 hours) at 05/11/2018 1006 Last data filed at 05/11/2018 0919 Gross per 24 hour  Intake 420 ml  Output 400 ml  Net 20 ml   Filed Weights   05/02/18 2000 05/09/18 1200 05/09/18 2046  Weight: 75.3 kg (166 lb 0.1 oz) 75.1 kg (165 lb 9.1 oz) 75 kg (165 lb 5.5 oz)    Examination:    General: deconditioned  Neurology: Awake and alert, non focal  E ENT: mild pallor, no icterus, oral mucosa moist Cardiovascular: No JVD. S1-S2 present, rhythmic, no gallops, rubs, or murmurs. No lower extremity edema. Pulmonary: decreased breath sounds bilaterally, adequate air movement, no wheezing, rhonchi or rales. Gastrointestinal. Abdomen with no organomegaly, non tender, no rebound or guarding Skin. No rashes Musculoskeletal: no joint deformities     Data Reviewed: I have personally reviewed following labs and imaging studies  CBC: Recent Labs  Lab 05/05/18 0602 05/06/18 0547 05/07/18 0527 05/08/18 0755 05/09/18 0406 05/10/18 1039  WBC 4.1 4.5 4.1 4.8 4.4 5.7  NEUTROABS 3.9 4.3 3.8 4.6 4.1  --   HGB 12.9* 12.2* 11.7* 13.1 12.0* 11.5*  HCT 39.6 36.7* 35.3* 39.3 36.2* 35.0*  MCV 79.2 76.6* 76.6* 76.6* 76.5* 79.0  PLT 44* 41* 35* 25*  26* 32* 43*   Basic Metabolic Panel: Recent Labs  Lab 05/05/18 0806 05/06/18 0547 05/07/18 0527 05/08/18 0755 05/09/18 0406 05/10/18 0357  NA  --  126* 133* 139 136 136  K  --  3.7 3.4* 3.5 3.7 3.7  CL  --  96* 101 105 105 109  CO2  --  21* 22 24 20* 23  GLUCOSE  --  81 132* 94 145* 110*  BUN  --  15 17 13 13 10   CREATININE  --  0.85 0.83 0.79 0.84 0.78  CALCIUM  --  7.2* 8.1* 8.6* 8.7* 8.3*  MG 1.8  --   --   --   --   --    GFR: Estimated Creatinine Clearance: 96.4 mL/min (by C-G formula based on SCr of 0.78 mg/dL). Liver Function Tests: Recent Labs  Lab 05/06/18 0547 05/07/18 0527 05/08/18 0755 05/08/18 1846 05/09/18 0406 05/10/18 0357  AST 237* 175* 174*  --  154* 124*  ALT 117* 98* 96*  --  83* 72*  ALKPHOS 394* 541* 925*  --  1,111* 963*  BILITOT 4.4* 3.3* 4.0* 1.6* 2.2* 1.5*  PROT 4.1* 4.1* 4.5*  --  3.7* 4.3*  ALBUMIN 1.5* 1.5* 1.5*  --  1.4* 1.5*   No results for input(s): LIPASE, AMYLASE in the last 168 hours. Recent Labs  Lab 05/07/18 0527  AMMONIA 31   Coagulation Profile: Recent Labs  Lab  05/08/18 0755  INR 1.19   Cardiac Enzymes: No results for input(s): CKTOTAL, CKMB, CKMBINDEX, TROPONINI in the last 168 hours. BNP (last 3 results) No results for input(s): PROBNP in the last 8760 hours. HbA1C: No results for input(s): HGBA1C in the last 72 hours. CBG: Recent Labs  Lab 05/10/18 0931 05/10/18 1213 05/10/18 1638 05/10/18 2117 05/11/18 0726  GLUCAP 133* 137* 139* 169* 101*   Lipid Profile: No results for input(s): CHOL, HDL, LDLCALC, TRIG, CHOLHDL, LDLDIRECT in the last 72 hours. Thyroid Function Tests: No results for input(s): TSH, T4TOTAL, FREET4, T3FREE, THYROIDAB in the last 72 hours. Anemia Panel: No results for input(s): VITAMINB12, FOLATE, FERRITIN, TIBC, IRON, RETICCTPCT in the last 72 hours.    Radiology Studies: I have  reviewed all of the imaging during this hospital visit personally     Scheduled Meds: . ethambutol  1,200 mg Oral Daily  . feeding supplement (ENSURE ENLIVE)  237 mL Oral TID WC  . isoniazid  300 mg Oral Daily  . levofloxacin  750 mg Oral Daily  . lidocaine  10 mL Intradermal Once  . metoprolol tartrate  12.5 mg Oral BID  . pyrazinamide  1,500 mg Oral Daily  . vitamin B-6  50 mg Oral Daily  . sodium chloride flush  3 mL Intravenous Q12H  . sodium chloride  1 g Oral Daily   Continuous Infusions: . sodium chloride       LOS: 9 days        Calel Pisarski Gerome Apley, MD [facility] Pager [phone]

## 2018-05-12 DIAGNOSIS — I10 Essential (primary) hypertension: Secondary | ICD-10-CM

## 2018-05-12 DIAGNOSIS — D7589 Other specified diseases of blood and blood-forming organs: Secondary | ICD-10-CM

## 2018-05-12 LAB — CBC WITH DIFFERENTIAL/PLATELET
Abs Immature Granulocytes: 0.1 10*3/uL (ref 0.0–0.1)
BASOS ABS: 0 10*3/uL (ref 0.0–0.1)
Basophils Relative: 0 %
EOS ABS: 0 10*3/uL (ref 0.0–0.7)
EOS PCT: 1 %
HEMATOCRIT: 35.4 % — AB (ref 39.0–52.0)
Hemoglobin: 11.7 g/dL — ABNORMAL LOW (ref 13.0–17.0)
Immature Granulocytes: 1 %
LYMPHS ABS: 0.3 10*3/uL — AB (ref 0.7–4.0)
LYMPHS PCT: 4 %
MCH: 25.7 pg — ABNORMAL LOW (ref 26.0–34.0)
MCHC: 33.1 g/dL (ref 30.0–36.0)
MCV: 77.8 fL — ABNORMAL LOW (ref 78.0–100.0)
Monocytes Absolute: 0.4 10*3/uL (ref 0.1–1.0)
Monocytes Relative: 6 %
Neutro Abs: 5.5 10*3/uL (ref 1.7–7.7)
Neutrophils Relative %: 88 %
Platelets: 53 10*3/uL — ABNORMAL LOW (ref 150–400)
RBC: 4.55 MIL/uL (ref 4.22–5.81)
RDW: 17.7 % — AB (ref 11.5–15.5)
WBC: 6.2 10*3/uL (ref 4.0–10.5)

## 2018-05-12 LAB — BASIC METABOLIC PANEL
Anion gap: 6 (ref 5–15)
BUN: 9 mg/dL (ref 6–20)
CHLORIDE: 110 mmol/L (ref 101–111)
CO2: 20 mmol/L — ABNORMAL LOW (ref 22–32)
Calcium: 8.2 mg/dL — ABNORMAL LOW (ref 8.9–10.3)
Creatinine, Ser: 0.76 mg/dL (ref 0.61–1.24)
Glucose, Bld: 94 mg/dL (ref 65–99)
POTASSIUM: 3.5 mmol/L (ref 3.5–5.1)
SODIUM: 136 mmol/L (ref 135–145)

## 2018-05-12 LAB — HEPATIC FUNCTION PANEL
ALBUMIN: 1.7 g/dL — AB (ref 3.5–5.0)
ALT: 47 U/L (ref 17–63)
AST: 67 U/L — ABNORMAL HIGH (ref 15–41)
Alkaline Phosphatase: 684 U/L — ABNORMAL HIGH (ref 38–126)
BILIRUBIN TOTAL: 2.4 mg/dL — AB (ref 0.3–1.2)
Bilirubin, Direct: 1.1 mg/dL — ABNORMAL HIGH (ref 0.1–0.5)
Indirect Bilirubin: 1.3 mg/dL — ABNORMAL HIGH (ref 0.3–0.9)
TOTAL PROTEIN: 4.6 g/dL — AB (ref 6.5–8.1)

## 2018-05-12 LAB — HEMOGLOBIN A1C
HEMOGLOBIN A1C: 11.8 % — AB (ref 4.8–5.6)
MEAN PLASMA GLUCOSE: 291.96 mg/dL

## 2018-05-12 LAB — GLUCOSE, CAPILLARY
GLUCOSE-CAPILLARY: 87 mg/dL (ref 65–99)
GLUCOSE-CAPILLARY: 81 mg/dL (ref 65–99)
GLUCOSE-CAPILLARY: 89 mg/dL (ref 65–99)
GLUCOSE-CAPILLARY: 103 mg/dL — AB (ref 65–99)

## 2018-05-12 MED ORDER — PRO-STAT SUGAR FREE PO LIQD
30.0000 mL | Freq: Two times a day (BID) | ORAL | Status: DC
  Administered 2018-05-12 – 2018-05-15 (×3): 30 mL via ORAL
  Filled 2018-05-12 (×5): qty 30

## 2018-05-12 NOTE — Progress Notes (Signed)
Physical Therapy Treatment Patient Details Name: Jeffrey Hayes MRN: [id number] DOB: [birth year] Today's Date: 05/12/2018    History of Present Illness 67 y.o. male with medical history significant for hypertension, recent diagnosis of diabetes, and sarcoidosis with intra-abdominal manifestations status post recent course of prednisone, presenting to the emergency department for evaluation of poor appetite with weight loss, palpitations, and exertional dyspnea over 3 weeks. CT scan was performed which shows extensive cavitary pneumonia and infiltrates. Workup being completed for tuberculosis vs Mycobacterium    PT Comments    Patient is progressing very well towards their physical therapy goals. Noted significant improvement from last session. Patient able to ambulate around room with RW and min guard assist. Continues to present with some decreased safety awareness and requires cueing. Rest of session focused on functional strength training with practicing transfers utilizing good technique. Updated discharge plan based on patient being unable to go to CIR/SNF due to airborne precautions.    Follow Up Recommendations  Home health PT;Supervision for mobility/OOB     Equipment Recommendations  Rolling walker with 5" wheels;3in1 (PT)    Recommendations for Other Services       Precautions / Restrictions Precautions Precautions: Fall Precaution Comments: airborne precautions Restrictions Weight Bearing Restrictions: No    Mobility  Bed Mobility Overal bed mobility: Needs Assistance Bed Mobility: Supine to Sit     Supine to sit: Min assist     General bed mobility comments: min assist for trunk elevation  Transfers Overall transfer level: Needs assistance Equipment used: Rolling walker (2 wheeled) Transfers: Sit to/from Stand Sit to Stand: Min guard;Min assist         General transfer comment: patient requiring min assist initially for sit <> stand but able to progress to  min guard by end of session with improved hand placement for safety after cueing  Ambulation/Gait Ambulation/Gait assistance: Min guard Ambulation Distance (Feet): 25 Feet Assistive device: Rolling walker (2 wheeled) Gait Pattern/deviations: Step-through pattern;Decreased stride length     General Gait Details: Good upright posture throughout gait but needs cueing for RW proximity and maintenance. Distance limited by environment (rsetricted to room based on airborne precautions).   Stairs             Wheelchair Mobility    Modified Rankin (Stroke Patients Only)       Balance Overall balance assessment: Needs assistance Sitting-balance support: Feet supported;No upper extremity supported Sitting balance-Leahy Scale: Good     Standing balance support: Bilateral upper extremity supported Standing balance-Leahy Scale: Fair                              Cognition Arousal/Alertness: Awake/alert Behavior During Therapy: WFL for tasks assessed/performed Overall Cognitive Status: No family/caregiver present to determine baseline cognitive functioning                                 General Comments: pt demonstrating delayed processing at times, requires increased time and occasional tactile cues to follow one step commands      Exercises Other Exercises Other Exercises: 5 sit to stands from recliner for functional strengthening    General Comments        Pertinent Vitals/Pain Pain Assessment: Faces Faces Pain Scale: No hurt    Home Living  Prior Function            PT Goals (current goals can now be found in the care plan section) Acute Rehab PT Goals Patient Stated Goal: go home and be able to cook PT Goal Formulation: With patient Time For Goal Achievement: 05/24/18 Potential to Achieve Goals: Good Progress towards PT goals: Progressing toward goals    Frequency    Min 3X/week      PT Plan  Discharge plan needs to be updated    Co-evaluation              AM-PAC PT "6 Clicks" Daily Activity  Outcome Measure  Difficulty turning over in bed (including adjusting bedclothes, sheets and blankets)?: None Difficulty moving from lying on back to sitting on the side of the bed? : Unable Difficulty sitting down on and standing up from a chair with arms (e.g., wheelchair, bedside commode, etc,.)?: A Little Help needed moving to and from a bed to chair (including a wheelchair)?: A Little Help needed walking in hospital room?: A Little Help needed climbing 3-5 steps with a railing? : A Little 6 Click Score: 17    End of Session Equipment Utilized During Treatment: Gait belt Activity Tolerance: Patient tolerated treatment well Patient left: in chair;with call bell/phone within reach;with chair alarm set;with family/visitor present Nurse Communication: Mobility status PT Visit Diagnosis: Muscle weakness (generalized) (M62.81);Other abnormalities of gait and mobility (R26.89)     Time: 6384-5364 PT Time Calculation (min) (ACUTE ONLY): 25 min  Charges:  $Therapeutic Activity: 23-37 mins                    G Codes:       Ellamae Sia, PT, DPT Acute Rehabilitation Services  Pager: [place] 05/12/2018, 3:27 PM

## 2018-05-12 NOTE — Progress Notes (Signed)
I got the preliminary report from the pathologist regarding the bone marrow.  He says that the bone marrow is full of granulomas.  As such, this is either his mycobacterial disease or sarcoidosis.  There is no evidence of malignancy.  The platelet count is 53,000.  This is a little bit better.  I still have to wonder what is going on with his liver.  His alkaline phosphatase is 684.  His albumin is only 1.7.  Thankfully, there is no obvious hematologic issue at this point.  We will have to await the final bone marrow report.  However, the pathologist is quite confident that we are not looking at any hematologic malignancy.  As his platelet count is improving, I think that we can still just follow along.  Lattie Haw, MD  Psalm 9:9

## 2018-05-12 NOTE — Progress Notes (Signed)
[facility]ious Disease   Reason for visit: Follow up on probable Tb  Interval History:bone marrow smear negative AFB; WBC wnl, platelets up, LFTs trending down; no associated rash  Physical Exam: Constitutional:  Vitals:   05/12/18 0425 05/12/18 0726  BP: 130/79 126/75  Pulse: 99 (!) 109  Resp: 20 18  Temp: 98.9 F (37.2 C) 98.2 F (36.8 C)  SpO2: 95% 95%   patient appears in NAD CV: RRR Pulmonary: CTA B; normal respiratory effort GI: soft, nt MS: no edema Skin: no rashes    Review of Systems: Constitutional: negative for fevers and chills  Lab Results  Component Value Date   WBC 6.2 05/12/2018   HGB 11.7 (L) 05/12/2018   HCT 35.4 (L) 05/12/2018   MCV 77.8 (L) 05/12/2018   PLT 53 (L) 05/12/2018    Lab Results  Component Value Date   CREATININE 0.76 05/12/2018   BUN 9 05/12/2018   NA 136 05/12/2018   K 3.5 05/12/2018   CL 110 05/12/2018   CO2 20 (L) 05/12/2018    Lab Results  Component Value Date   ALT 47 05/12/2018   AST 67 (H) 05/12/2018   ALKPHOS 684 (H) 05/12/2018     Microbiology: Recent Results (from the past 240 hour(s))  Culture, blood (routine x 2) Call MD if unable to obtain prior to antibiotics being given     Status: None   Collection Time: 05/03/18 12:58 AM  Result Value Ref Range Status   Specimen Description BLOOD RIGHT HAND  Final   Special Requests   Final    BOTTLES DRAWN AEROBIC AND ANAEROBIC Blood Culture results may not be optimal due to an inadequate volume of blood received in culture bottles   Culture   Final    NO GROWTH 5 DAYS Performed at [facility], [place] [address], [place], [place] [address]    Report Status 05/08/2018 FINAL  Final  Culture, blood (routine x 2) Call MD if unable to obtain prior to antibiotics being given     Status: None   Collection Time: 05/03/18 12:58 AM  Result Value Ref Range Status   Specimen Description BLOOD RIGHT FOREARM  Final   Special Requests   Final    BOTTLES  DRAWN AEROBIC AND ANAEROBIC Blood Culture results may not be optimal due to an inadequate volume of blood received in culture bottles   Culture   Final    NO GROWTH 5 DAYS Performed at [facility], [place] [address], [place], [place] [address]    Report Status 05/08/2018 FINAL  Final  Acid Fast Smear (AFB)     Status: None   Collection Time: 05/03/18 10:39 PM  Result Value Ref Range Status   AFB Specimen Processing Concentration  Final   Acid Fast Smear Negative  Final    Comment: (NOTE) Performed At: [facility] [place], [place] [id number] Rush Farmer MD [id number]    Source (AFB) SPUTUM  Final  Culture, respiratory (NON-Expectorated)     Status: None   Collection Time: 05/03/18 10:39 PM  Result Value Ref Range Status   Specimen Description SPUTUM  Final   Special Requests NONE  Final   Gram Stain   Final    NO WBC SEEN FEW GRAM VARIABLE ROD FEW GRAM POSITIVE COCCI IN PAIRS RARE YEAST    Culture   Final    FEW Consistent with normal respiratory flora. Performed at [facility], [address] [place], [place] [address]    Report Status 05/06/2018 FINAL  Final  Acid Fast Smear (AFB)     Status: None   Collection Time: 05/04/18 11:08 AM  Result Value Ref Range Status   AFB Specimen Processing Concentration  Final   Acid Fast Smear Negative  Final    Comment: (NOTE) Performed At: [facility] [place], [place] [id number] Rush Farmer MD [id number] Performed at [facility], [place] [address], [place], [place] [address]    Source (AFB) SPUTUM  Final  Acid Fast Smear (AFB)     Status: None   Collection Time: 05/06/18  2:53 PM  Result Value Ref Range Status   AFB Specimen Processing Concentration  Final   Acid Fast Smear Positive  Final    Comment: RESULT CALLED TO, READ BACK BY AND VERIFIED WITH: E CASTRO RN 2029 05/08/18 A BROWNING (NOTE) 2+, 4-36 acid-fast bacilli per 10 fields at 400X  magnification, fluorescent smear REPORTED/ FAXED TO ANGELA B. 06.03.19 AT 1830 -VH Performed At: [facility] [place], [place] [id number] Rush Farmer MD [id number]    Source (AFB) SPUTUM  Final    Comment: Performed at [facility], [place] [address], [place], [place] [address]  Acid Fast Smear (AFB)     Status: None   Collection Time: 05/10/18  9:02 AM  Result Value Ref Range Status   AFB Specimen Processing Comment  Final    Comment: Tissue Grinding and Digestion/Decontamination   Acid Fast Smear Negative  Final    Comment: (NOTE) Performed At: [facility] [place], [place] [id number] Rush Farmer MD [id number]    Source (AFB) BONE MARROW  Final    Comment: Performed at [facility], [place] [address] [place], [place] [address]    Impression/Plan:  1. 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     2. Medication monitoring - LFTs still trending down.  Continue to monitor  3. Thrombocytopenia - improving.  For discharge Monday and follow up with the HD.   Dr. Johnnye Sima is available over the weekend, otherwise I will follow up on Monday.

## 2018-05-12 NOTE — Progress Notes (Signed)
Initial Nutrition Assessment  DOCUMENTATION CODES:   Severe malnutrition in context of chronic illness  INTERVENTION:    Magic cup TID with meals, each supplement provides 290 kcal and 9 grams of protein  30 ml Prostat BID, each supplement provides 100 kcals and 15 grams protein.   NUTRITION DIAGNOSIS:   Severe Malnutrition related to chronic illness(sarcoidosis s/p prednisone course) as evidenced by severe fat depletion, severe muscle depletion, energy intake < or equal to 75% for > or equal to 1 month.  GOAL:   Patient will meet greater than or equal to 90% of their needs  MONITOR:   PO intake, Supplement acceptance, Weight trends, Labs  REASON FOR ASSESSMENT:   Consult Assessment of nutrition requirement/status  ASSESSMENT:   Patient with PMH significant for HTN, recent diagnosis of DM, and sarcoidosis with intra-abdominal manifestations s/p prednisone course. Presents this admission with poss appetite and exertional dyspnea over the last 3 weeks. Found to have bilateral cavitary PNA due to TB.    Spoke with wife at bedside. Reports pt's appetite was poor 1 month PTA related to taste changes with steroids. During this time period he would eat bites of oatmeal, cheerios with milk, bananas, and apples. Intake this admission has remained poor with meal completions charted as 0-25%. Pt does not like the taste Ensure. RD offered different protein options that are lesser in volume.   Wife endorses pt weighed 195-200 lb in January 2019. Records show a stated weight of 194 lb 11/07/17 and 163 lb this admission. Unable to determine actual weight loss with stated weights. Suspect pt has lost a significant amount of weight.  Nutrition-Focused physical exam completed.   Pt was to d/c today but the Health Department is requesting Hemoglobin A1C results and antibiotics. Plan for d/c Monday.   Medications reviewed and include: vitamin B6 Labs reviewed: AST 67 (H)   NUTRITION - FOCUSED  PHYSICAL EXAM:    Most Recent Value  Orbital Region  Moderate depletion  Upper Arm Region  Severe depletion  Thoracic and Lumbar Region  Severe depletion  Buccal Region  Moderate depletion  Temple Region  Severe depletion  Clavicle Bone Region  Severe depletion  Clavicle and Acromion Bone Region  Moderate depletion  Scapular Bone Region  Moderate depletion  Dorsal Hand  No depletion  Patellar Region  Severe depletion  Anterior Thigh Region  Severe depletion  Posterior Calf Region  Moderate depletion  Edema (RD Assessment)  None  Hair  Reviewed  Eyes  Reviewed  Mouth  Reviewed  Skin  Reviewed  Nails  Reviewed     Diet Order:   Diet Order           DIET SOFT Room service appropriate? Yes; Fluid consistency: Thin  Diet effective now          EDUCATION NEEDS:   Education needs have been addressed  Skin:  Skin Assessment: Skin Integrity Issues: Skin Integrity Issues:: Incisions Incisions: right lower back  Last BM:  05/12/18  Height:   Ht Readings from Last 1 Encounters:  05/02/18 6\' 2"  (1.88 m)    Weight:   Wt Readings from Last 1 Encounters:  05/11/18 163 lb 2.3 oz (74 kg)    Ideal Body Weight:  86.4 kg  BMI:  Body mass index is 20.95 kg/m.  Estimated Nutritional Needs:   Kcal:  2250-2450 kcal  Protein:  115-125 g  Fluid:  >2.2 L/day    Mariana Single RD, LDN Clinical Nutrition Pager # -  [phone]  

## 2018-05-12 NOTE — Progress Notes (Signed)
Inpatient Diabetes Program Recommendations  AACE/ADA: New Consensus Statement on Inpatient Glycemic Control (2015)  Target Ranges:  Prepandial:   less than 140 mg/dL      Peak postprandial:   less than 180 mg/dL (1-2 hours)      Critically ill patients:  140 - 180 mg/dL   Lab Results  Component Value Date   GLUCAP 81 05/12/2018   HGBA1C 11.8 (H) 05/12/2018    Review of Glycemic ControlResults for ALBEN, JEPSEN (MRN [id number]) as of 05/12/2018 13:01  Ref. Range 05/11/2018 11:38 05/11/2018 16:48 05/11/2018 21:21 05/12/2018 07:24 05/12/2018 12:01  Glucose-Capillary Latest Ref Range: 65 - 99 mg/dL 154 (H) 104 (H) 103 (H) 87 81    Diabetes history: Type 2 DM  Outpatient Diabetes medications: Amaryl 4 mg daily, Metformin 1000 mg bid Current orders for Inpatient glycemic control:  None Inpatient Diabetes Program Recommendations:   Blood sugars less than 100 mg/dL today.   A1C is >11% however this does not correspond with current blood sugars in the hospital.   Will follow.   Thanks,  Adah Perl, RN, BC-ADM Inpatient Diabetes Coordinator Pager [phone] (8a-5p)

## 2018-05-12 NOTE — Care Management (Addendum)
[place] with Dr Cathlean Sauer, he will order Hgb A 1 C, patient discharging Monday May 15, 2018 . Wilburn Cornelia with Health Department aware. Cassandra requesting NCM call her on Monday with what time patient is being discharged. Will left CM hand off. Magdalen Spatz RN BSN     CM called l the [facility] Department: Evelena Asa will be the Case Manager for the patient & she can be reached @ [phone] or cell [phone].   Cassandra needs a Hgb A 1 C drawn on patient before discharge, needs to know if patient is being discharged today and if so what time. Cassandra stops seeing patients at 5 pm and admission takes 2 hours. If patient discharged today hospital will have to provide all TB medications for today, Saturday and Sunday. Department of Health will begin providing TB medications on Monday May 15, 2018.Paged MD . Magdalen Spatz RN BSN [phone]

## 2018-05-12 NOTE — Progress Notes (Signed)
PROGRESS NOTE    Jeffrey Hayes  YYF:[id number] DOB: 04/17/1951 DOA: 05/02/2018 PCP: [name], MD    Brief Narrative:  67 year old male who presented with weight loss malaise and dyspnea on exertion. He does have the significant past medical history for hypertension, type 2 diabetes mellitus and sarcoidosis with intra-abdominal manifestations. Status post recent course of prednisone. Reported 3 weeks of worsening symptoms, associated with productive cough.On the initial physical examination blood pressure 113/68, heart rate 107, respiratory 26, oxygen saturation 92%. Moist mucous membranes, lungs with decreased breath sounds bilaterally, mild tachypnea, heart rate 110 bpm, abdomen soft nontender, no lower extremity edema.  Sodium 126, potassium 4.0, chloride 88, bicarb 26, glucose 166, BUN 11, creatinine 0.99, alkaline phosphatase 158, AST 129, ALT 90, total bilirubin 1.4.  White cell count 8.0, hemoglobin 14.3, hematocrit 43.4, platelets at 92.  Urinalysis with specific gravity 1.020, protein 30.  Drug screen positive for tetrahydrocannabinol.  Chest x-ray with diffuse bilateral interstitial lung markings/reticulonodular.  Chest CT with bilateral apical cavitary lesions.  EKG sinus tachycardia with positive PVCs and PACs.  Patient was admitted to the hospital with the working diagnosis of cavitary pneumonia.  Assessment & Plan:   Principal Problem:   Severe sepsis ([place]) Active Problems:   Diabetes mellitus without complication ([facility] [place])   Sarcoidosis   Hypertension   Cavitary pneumonia   Thrombocytopenia ([facility])   Hyponatremia   LFTs abnormal   Intractable hiccups   Community acquired pneumonia   Cavitary lesion of lung   HCAP (healthcare-associated pneumonia)   Hypoxemia   Pulmonary tuberculosis   1. 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 In the setting of sarcoidosis and recent steroid exposure. Final antibiotic regimen with rifampin,  isoniazid, ethambutol and pyrazinamide, will refer to the health department for further follow up on Monday June 10. Social services consulted for home health services.   2. Elevated liver enzymes and hyperbilirubinemia. AST and ALT have been trending down, will check liver panel in am.   3. Thrombocytopenia. Platelet count up to 53, no signs of bleeding, bone marrow biopsy continue to be pending today.   4. SIADH. Serum Na at 136, with K at 3,5 and serum bicarbonate at 20. Will continue close follow up of renal function and electrolytes.   5. HTN. Metoprolol 12.5 mg po bid, with adequate blood pressure systolic.   6. T2DM. Contiue glucose cover and monitoring with insulin sliding scale. Capillary glucose 154, 104, 103, 87, 81. Very poor oral intake, will consult nutritionist consult.   DVT prophylaxis:enoxaparin Code Status:full Family Communication:I spoke with patient's wife at the bedside and all questions were addressed.  Disposition Plan:Home with follow up at the health department in Monday June 10.   Consultants:  Pulmonary   ID  Procedures:    Antimicrobials:  Rifampin  Isoniazid  Pyrazinamide   ethambutol   Subjective: Patient is feeling weak and deconditioned, no nausea or vomiting, but significant decreases po intake, no chest pain or dyspnea.   Objective: Vitals:   05/11/18 1554 05/11/18 2037 05/12/18 0425 05/12/18 0726  BP: 124/86 122/87 130/79 126/75  Pulse: (!) 114 (!) 106 99 (!) 109  Resp: _0 Temp:  98.2 F (36.8 C) 98.9 F (37.2 C) 98.2 F (36.8 C)  TempSrc:  Oral Oral   SpO2: 95% 97% 95% 95%  Weight:  74 kg (163 lb 2.3 oz)    Height:        Intake/Output Summary (Last 24 hours) at  05/12/2018 1109 Last data filed at 05/12/2018 6045 Gross per 24 hour  Intake 1000 ml  Output 875 ml  Net 125 ml   Filed Weights   05/09/18 1200 05/09/18 2046 05/11/18 2037  Weight: 75.1 kg (165 lb 9.1 oz) 75 kg (165 lb 5.5  oz) 74 kg (163 lb 2.3 oz)    Examination:   General: deconditioned  Neurology: Awake and alert, non focal  E ENT: no pallor, no icterus, oral mucosa moist Cardiovascular: No JVD. S1-S2 present, rhythmic, no gallops, rubs, or murmurs. No lower extremity edema. Pulmonary: vesicular breath sounds bilaterally, adequate air movement, no wheezing, rhonchi or rales. Gastrointestinal. Abdomen with no organomegaly, non tender, no rebound or guarding Skin. No rashes Musculoskeletal: no joint deformities     Data Reviewed: I have personally reviewed following labs and imaging studies  CBC: Recent Labs  Lab 05/06/18 0547 05/07/18 0527 05/08/18 0755 05/09/18 0406 05/10/18 1039 05/12/18 0644  WBC 4.5 4.1 4.8 4.4 5.7 6.2  NEUTROABS 4.3 3.8 4.6 4.1  --  5.5  HGB 12.2* 11.7* 13.1 12.0* 11.5* 11.7*  HCT 36.7* 35.3* 39.3 36.2* 35.0* 35.4*  MCV 76.6* 76.6* 76.6* 76.5* 79.0 77.8*  PLT 41* 35* 25*  26* 32* 43* 53*   Basic Metabolic Panel: Recent Labs  Lab 05/07/18 0527 05/08/18 0755 05/09/18 0406 05/10/18 0357 05/12/18 0644  NA 133* 139 136 136 136  K 3.4* 3.5 3.7 3.7 3.5  CL 101 105 105 109 110  CO2 22 24 20* 23 20*  GLUCOSE 132* 94 145* 110* 94  BUN _0 CREATININE 0.83 0.79 0.84 0.78 0.76  CALCIUM 8.1* 8.6* 8.7* 8.3* 8.2*   GFR: Estimated Creatinine Clearance: 95.1 mL/min (by C-G formula based on SCr of 0.76 mg/dL). Liver Function Tests: Recent Labs  Lab 05/07/18 0527 05/08/18 0755 05/08/18 1846 05/09/18 0406 05/10/18 0357 05/12/18 0644  AST 175* 174*  --  154* 124* 67*  ALT 98* 96*  --  83* 72* 47  ALKPHOS 541* 925*  --  1,111* 963* 684*  BILITOT 3.3* 4.0* 1.6* 2.2* 1.5* 2.4*  PROT 4.1* 4.5*  --  3.7* 4.3* 4.6*  ALBUMIN 1.5* 1.5*  --  1.4* 1.5* 1.7*   No results for input(s): LIPASE, AMYLASE in the last 168 hours. Recent Labs  Lab 05/07/18 0527  AMMONIA 31   Coagulation Profile: Recent Labs  Lab 05/08/18 0755  INR 1.19   Cardiac Enzymes: No  results for input(s): CKTOTAL, CKMB, CKMBINDEX, TROPONINI in the last 168 hours. BNP (last 3 results) No results for input(s): PROBNP in the last 8760 hours. HbA1C: No results for input(s): HGBA1C in the last 72 hours. CBG: Recent Labs  Lab 05/11/18 0726 05/11/18 1138 05/11/18 1648 05/11/18 2121 05/12/18 0724  GLUCAP 101* 154* 104* 103* 87   Lipid Profile: No results for input(s): CHOL, HDL, LDLCALC, TRIG, CHOLHDL, LDLDIRECT in the last 72 hours. Thyroid Function Tests: No results for input(s): TSH, T4TOTAL, FREET4, T3FREE, THYROIDAB in the last 72 hours. Anemia Panel: No results for input(s): VITAMINB12, FOLATE, FERRITIN, TIBC, IRON, RETICCTPCT in the last 72 hours.    Radiology Studies: I have reviewed all of the imaging during this hospital visit personally     Scheduled Meds: . ethambutol  1,200 mg Oral Daily  . feeding supplement (ENSURE ENLIVE)  237 mL Oral TID WC  . isoniazid  300 mg Oral Daily  . lidocaine  10 mL Intradermal Once  . metoprolol tartrate  12.5 mg  Oral BID  . pyrazinamide  1,500 mg Oral Daily  . vitamin B-6  50 mg Oral Daily  . rifampin  600 mg Oral Daily  . sodium chloride flush  3 mL Intravenous Q12H  . sodium chloride  1 g Oral Daily   Continuous Infusions: . sodium chloride       LOS: 10 days        Mauricio Gerome Apley, MD [facility] Pager [phone]

## 2018-05-13 DIAGNOSIS — E43 Unspecified severe protein-calorie malnutrition: Secondary | ICD-10-CM

## 2018-05-13 DIAGNOSIS — E119 Type 2 diabetes mellitus without complications: Secondary | ICD-10-CM

## 2018-05-13 LAB — GLUCOSE, CAPILLARY
GLUCOSE-CAPILLARY: 222 mg/dL — AB (ref 65–99)
Glucose-Capillary: 80 mg/dL (ref 65–99)
Glucose-Capillary: 64 mg/dL — ABNORMAL LOW (ref 65–99)
Glucose-Capillary: 118 mg/dL — ABNORMAL HIGH (ref 65–99)

## 2018-05-13 NOTE — Progress Notes (Signed)
PROGRESS NOTE    Jeffrey Hayes  XMI:[id number] DOB: 12-Oct-1951 DOA: 05/02/2018 PCP: [name], MD    Brief Narrative:  67 year old male who presented with weight loss malaise and dyspnea on exertion. He does have the significant past medical history for hypertension, type 2 diabetes mellitus and sarcoidosis with intra-abdominal manifestations. Status post recent course of prednisone. Reported 3 weeks of worsening symptoms, associated with productive cough.On the initial physical examination blood pressure 113/68, heart rate 107, respiratory 26, oxygen saturation 92%. Moist mucous membranes, lungs with decreased breath sounds bilaterally, mild tachypnea, heart rate 110 bpm, abdomen soft nontender, no lower extremity edema.Sodium 126, potassium 4.0, chloride 88, bicarb 26, glucose 166, BUN 11, creatinine 0.99,alkaline phosphatase 158, AST 129, ALT 90, total bilirubin 1.4.White cell count 8.0, hemoglobin 14.3, hematocrit 43.4, platelets at 92.Urinalysis with specific gravity 1.020, protein 30.Drug screen positive for tetrahydrocannabinol. Chest x-ray with diffuse bilateral interstitial lung markings/reticulonodular. Chest CT with bilateral apical cavitary lesions.EKG sinus tachycardia with positive PVCs and PACs.  Patient was admitted to the hospital withtheworking diagnosis of cavitary pneumonia.  Assessment & Plan:   Principal Problem:   Severe sepsis ([place]) Active Problems:   Diabetes mellitus without complication ([place])   Sarcoidosis   Hypertension   Cavitary pneumonia   Thrombocytopenia ([facility])   Hyponatremia   LFTs abnormal   Intractable hiccups   Community acquired pneumonia   Cavitary lesion of lung   HCAP (healthcare-associated pneumonia)   Hypoxemia   Pulmonary tuberculosis   Protein-calorie malnutrition, severe   1. 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 In the setting of sarcoidosis and recent steroid  exposure.Continue rifampin, isoniazid, ethambutol and pyrazinamide follow on Monday June 10 with the health department.  2. Elevated liver enzymes and hyperbilirubinemia.parameters have been improving. Patient with no abdominal pain, nausea or vomiting.  3. Thrombocytopenia. Stable platelet count, will follow cell count in am. Bone marrow biopsy with normal cellularity and multiple granulomas.   4. SIADH.Follow on renal pane and electrolytes in am.    5. HTN.Continue metoprolol 12.5 mg po bid.   6. T2DM.Glucose cover and monitoring with insulin sliding scale. Capillary glucose has been low at 81, 89, 103, 80, 64. Continue to have a  very poor oral intake.   7. Severe calorie protein malnutrition. Will continue nutritional supplements as tolerated. Will need close follow up as outpatient. Not candidate for CIR or SNF due to active tuberculosis.    DVT prophylaxis:enoxaparin Code Status:full Family Communication:I spoke with patient'swifeat the bedside and all questions were addressed.  Disposition Plan:Home with follow up at the health department in Monday June 10.   Consultants:  Pulmonary   ID  Procedures:    Antimicrobials:  Rifampin  Isoniazid  Pyrazinamide   ethambutol   Subjective: Patient continue to be very weak and deconditioned, poor appetite and poor oral intake, no chest pain or dyspnea, no nausea or vomiting.   Objective: Vitals:   05/12/18 1658 05/12/18 2051 05/13/18 0350 05/13/18 0822  BP: 126/84 118/81 119/79 121/81  Pulse: (!) 103 (!) 101 86 (!) 110  Resp: 18 20 16  (!) 22  Temp:  97.9 F (36.6 C) (!) 97.5 F (36.4 C) 99.5 F (37.5 C)  TempSrc:  Oral Oral Oral  SpO2: 96% 99% 97% 97%  Weight:  71.4 kg (157 lb 6.5 oz)    Height:        Intake/Output Summary (Last 24 hours) at 05/13/2018 1406 Last data filed at 05/13/2018 1330 Gross per 24 hour  Intake 840 ml  Output 1200 ml  Net -360 ml   Filed Weights    05/09/18 2046 05/11/18 2037 05/12/18 2051  Weight: 75 kg (165 lb 5.5 oz) 74 kg (163 lb 2.3 oz) 71.4 kg (157 lb 6.5 oz)    Examination:   General: deconditioned and ill looking appearing  Neurology: Awake and alert, non focal  E ENT: no pallor, no icterus, oral mucosa moist Cardiovascular: No JVD. S1-S2 present, rhythmic, no gallops, rubs, or murmurs. No lower extremity edema. Pulmonary: decreased breath sounds bilaterally, no wheezing, rhonchi or rales. Gastrointestinal. Abdomen with no organomegaly, non tender, no rebound or guarding Skin. No rashes Musculoskeletal: no joint deformities     Data Reviewed: I have personally reviewed following labs and imaging studies  CBC: Recent Labs  Lab 05/07/18 0527 05/08/18 0755 05/09/18 0406 05/10/18 1039 05/12/18 0644  WBC 4.1 4.8 4.4 5.7 6.2  NEUTROABS 3.8 4.6 4.1  --  5.5  HGB 11.7* 13.1 12.0* 11.5* 11.7*  HCT 35.3* 39.3 36.2* 35.0* 35.4*  MCV 76.6* 76.6* 76.5* 79.0 77.8*  PLT 35* 25*  26* 32* 43* 53*   Basic Metabolic Panel: Recent Labs  Lab 05/07/18 0527 05/08/18 0755 05/09/18 0406 05/10/18 0357 05/12/18 0644  NA 133* 139 136 136 136  K 3.4* 3.5 3.7 3.7 3.5  CL 101 105 105 109 110  CO2 22 24 20* 23 20*  GLUCOSE 132* 94 145* 110* 94  BUN 17 13 13 10 9   CREATININE 0.83 0.79 0.84 0.78 0.76  CALCIUM 8.1* 8.6* 8.7* 8.3* 8.2*   GFR: Estimated Creatinine Clearance: 91.7 mL/min (by C-G formula based on SCr of 0.76 mg/dL). Liver Function Tests: Recent Labs  Lab 05/07/18 0527 05/08/18 0755 05/08/18 1846 05/09/18 0406 05/10/18 0357 05/12/18 0644  AST 175* 174*  --  154* 124* 67*  ALT 98* 96*  --  83* 72* 47  ALKPHOS 541* 925*  --  1,111* 963* 684*  BILITOT 3.3* 4.0* 1.6* 2.2* 1.5* 2.4*  PROT 4.1* 4.5*  --  3.7* 4.3* 4.6*  ALBUMIN 1.5* 1.5*  --  1.4* 1.5* 1.7*   No results for input(s): LIPASE, AMYLASE in the last 168 hours. Recent Labs  Lab 05/07/18 0527  AMMONIA 31   Coagulation Profile: Recent Labs  Lab  05/08/18 0755  INR 1.19   Cardiac Enzymes: No results for input(s): CKTOTAL, CKMB, CKMBINDEX, TROPONINI in the last 168 hours. BNP (last 3 results) No results for input(s): PROBNP in the last 8760 hours. HbA1C: Recent Labs    05/12/18 0644  HGBA1C 11.8*   CBG: Recent Labs  Lab 05/12/18 1201 05/12/18 1656 05/12/18 2054 05/13/18 0819 05/13/18 1157  GLUCAP 81 89 103* 80 64*   Lipid Profile: No results for input(s): CHOL, HDL, LDLCALC, TRIG, CHOLHDL, LDLDIRECT in the last 72 hours. Thyroid Function Tests: No results for input(s): TSH, T4TOTAL, FREET4, T3FREE, THYROIDAB in the last 72 hours. Anemia Panel: No results for input(s): VITAMINB12, FOLATE, FERRITIN, TIBC, IRON, RETICCTPCT in the last 72 hours.    Radiology Studies: I have reviewed all of the imaging during this hospital visit personally     Scheduled Meds: . ethambutol  1,200 mg Oral Daily  . feeding supplement (PRO-STAT SUGAR FREE 64)  30 mL Oral BID  . isoniazid  300 mg Oral Daily  . lidocaine  10 mL Intradermal Once  . metoprolol tartrate  12.5 mg Oral BID  . pyrazinamide  1,500 mg Oral Daily  . vitamin B-6  50 mg  Oral Daily  . rifampin  600 mg Oral Daily  . sodium chloride flush  3 mL Intravenous Q12H  . sodium chloride  1 g Oral Daily   Continuous Infusions: . sodium chloride       LOS: 11 days        Mauricio Gerome Apley, MD [facility] Pager [phone]

## 2018-05-14 LAB — BASIC METABOLIC PANEL
Anion gap: 10 (ref 5–15)
BUN: 9 mg/dL (ref 6–20)
CO2: 21 mmol/L — AB (ref 22–32)
Calcium: 8.1 mg/dL — ABNORMAL LOW (ref 8.9–10.3)
Chloride: 109 mmol/L (ref 101–111)
Creatinine, Ser: 0.58 mg/dL — ABNORMAL LOW (ref 0.61–1.24)
GFR calc Af Amer: >60 mL/min (ref >60)
GLUCOSE: 80 mg/dL (ref 65–99)
POTASSIUM: 3.4 mmol/L — AB (ref 3.5–5.1)
Sodium: 140 mmol/L (ref 135–145)

## 2018-05-14 LAB — GLUCOSE, CAPILLARY
GLUCOSE-CAPILLARY: 78 mg/dL (ref 65–99)
Glucose-Capillary: 88 mg/dL (ref 65–99)
Glucose-Capillary: 119 mg/dL — ABNORMAL HIGH (ref 65–99)
Glucose-Capillary: 106 mg/dL — ABNORMAL HIGH (ref 65–99)

## 2018-05-14 LAB — CBC WITH DIFFERENTIAL/PLATELET
ABS IMMATURE GRANULOCYTES: 0 10*3/uL (ref 0.0–0.1)
BASOS PCT: 0 %
Basophils Absolute: 0 10*3/uL (ref 0.0–0.1)
EOS ABS: 0 10*3/uL (ref 0.0–0.7)
Eosinophils Relative: 1 %
HEMATOCRIT: 38.3 % — AB (ref 39.0–52.0)
Hemoglobin: 12.3 g/dL — ABNORMAL LOW (ref 13.0–17.0)
Immature Granulocytes: 1 %
Lymphocytes Relative: 6 %
Lymphs Abs: 0.3 10*3/uL — ABNORMAL LOW (ref 0.7–4.0)
MCH: 25.5 pg — AB (ref 26.0–34.0)
MCHC: 32.1 g/dL (ref 30.0–36.0)
MCV: 79.5 fL (ref 78.0–100.0)
MONO ABS: 0.3 10*3/uL (ref 0.1–1.0)
MONOS PCT: 6 %
Neutro Abs: 4.1 10*3/uL (ref 1.7–7.7)
Neutrophils Relative %: 86 %
PLATELETS: 82 10*3/uL — AB (ref 150–400)
RBC: 4.82 MIL/uL (ref 4.22–5.81)
RDW: 18 % — AB (ref 11.5–15.5)
WBC: 4.8 10*3/uL (ref 4.0–10.5)

## 2018-05-14 MED ORDER — PRO-STAT SUGAR FREE PO LIQD: 30.0000 mL | Freq: Two times a day (BID) | ORAL | 0 refills | Status: DC

## 2018-05-14 MED ORDER — SODIUM CHLORIDE 0.9 % IV BOLUS
1000.0000 mL | Freq: Once | INTRAVENOUS | Status: AC
  Administered 2018-05-14: 1000 mL via INTRAVENOUS

## 2018-05-14 MED ORDER — METOPROLOL TARTRATE 25 MG PO TABS: 12.5000 mg | ORAL_TABLET | Freq: Two times a day (BID) | ORAL | 0 refills | Status: DC

## 2018-05-14 MED ORDER — IBUPROFEN 600 MG PO TABS
600.0000 mg | ORAL_TABLET | Freq: Once | ORAL | Status: AC
  Administered 2018-05-14: 600 mg via ORAL
  Filled 2018-05-14: qty 1

## 2018-05-14 MED ORDER — PYRIDOXINE HCL 50 MG PO TABS: 50.0000 mg | ORAL_TABLET | Freq: Every day | ORAL | 0 refills | Status: DC

## 2018-05-14 NOTE — Discharge Summary (Addendum)
Physician Discharge Summary  Jeffrey Hayes OVZ:[id number] DOB: 1951/01/06 DOA: 05/02/2018  PCP: [name], MD  Admit date: 05/02/2018 Discharge date: 05/14/2018  Admitted From: Home  Disposition:   Home  Recommendations for Outpatient Follow-up and new medication changes:  1. Follow up with Health department, don't follow up with primary care until TB controlled, please try to address questions over the phone.   2. Patient will follow up with the health department on Monday 6/10 to continue antibiotic therapy for active pulmonary tuberculosis.  3. Holding amlodipine and losartan to prevent hypotension 4. Holding atorvastatin due to elevated liver enzymes 5. Holding glimepiride due to risk of hypoglycemia 6. Holding Metformin due to poor oral intake.  7. Antituberculosis regimen per the health department: Ethambutol isoniazid, pyrazinamide and rifampin 8. Follow liver function testing as an outpatient within 7 days.   Home Health: yes  Equipment/Devices: walker, bedside commode and shower chair   Discharge Condition: stable  CODE STATUS: full  Diet recommendation: Diabetic prudent.   Brief/Interim Summary: 67 year old male who presented with weight loss, malaise and dyspnea on exertion. He does have the significant past medical history for hypertension, type 2 diabetes mellitus and sarcoidosis with intra-abdominal manifestations. Status post recent course of prednisone. Reported 3 weeks of worsening symptoms, weight loss, malaise and dyspnea on exertion associated with productive cough.On the initial physical examination blood pressure 113/68, heart rate 107, respiratory rate 26, oxygen saturation 92%. Moist mucous membranes, lungs with decreased breath sounds bilaterally, mild tachypnea, heart rate 110 bpm, abdomen soft nontender, no lower extremity edema.Sodium 126, potassium 4.0, chloride 88, bicarb 26, glucose 166, BUN 11, creatinine 0.99,alkaline phosphatase 158, AST 129, ALT  90, total bilirubin 1.4.White cell count 8.0, hemoglobin 14.3, hematocrit 43.4, platelets at 92.Urinalysis with specific gravity 1.020, protein 30.Drug screen positive for tetrahydrocannabinol. Chest x-ray with diffuse bilateral interstitial lung markings/reticulonodular. Chest CT with bilateral apical cavitary lesions.EKG sinus tachycardia with positive PVCs and PACs.  Patient was admitted to the hospital withtheworking diagnosis of cavitary pneumonia.  1.  Bilateral cavitary pneumonia due to active tuberculosis, present on admission, in the setting of sarcoidosis and recent systemic steroid exposure.  Patient was admitted to the medical ward and he was initially placed on IV antibiotic therapy with vancomycin and Zosyn for cavitary community-acquired pneumonia. Due to infiltrates morphologic features patient was placed on airborne precautions and acid-fast bacillus sputum cultures were sent.  AFB resulted positive, antibiotic therapy was changed to antituberculosis regimen, four drug regimen with levofloxacin, isoniazid, pyrazinamide, and ethambutol.  Posteriorly levofloxacin was exchanged to rifampin.  Patient has been tolerated antibiotic therapy well, the health department was contacted, patient will continue therapy as an outpatient with a strict supervised therapy.  Patient was placed on vitamin B6 supplements.  2.  Elevated liver enzymes with hyperbilirubinemia.  Likely multifactorial, close follow-up with liver enzymes showed improvement.  By June 7, AST 67, ALT 47, total bilirubin is 2.4.  No abdominal pain, nausea or vomiting.  CT of the abdomen showed normal liver.  Late entry: Continue to improve liver function testing, discharge AST 65, ALT 37, alkaline phosphatase 518, total bilirubin's 1.5  3.  Thrombocytopenia.  Likely multifactorial, including sarcoidosis and active tuberculosis. Nadir platelet count was 25, 000, by the time of discharge it is up to 82.  Further work-up with  bone marrow biopsy showed normocellular marrow with numerous granulomata.   4.  Hypertension.  Blood pressure remained well controlled with metoprolol.  Hold losartan and amlodipine to decrease risk of  hypotension.  5.  Type 2 diabetes mellitus.  Patient was placed on insulin sliding scale for glucose coverage and monitor, capillary glucose remained well controlled.  Discontinue glimepiride and metformin, patient very frail and risk of complications.  Hemoglobin A1c 11.8.   6.  Severe protein calorie malnutrition.  Patient was seen by nutritional service, nutritional supplements were added.  Severe deconditioning, patient qualified for inpatient rehab or skilled nursing facility, due active TB and lack of facilities with appropriate isolation rooms, patient will be discharged home with home health services.  7.  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  She received sodium tablets, discharge sodium 140, kidney function remained preserved with serum creatinine 0.58.  Discharge Diagnoses:  Principal Problem:   Severe sepsis ([place]) Active Problems:   Diabetes mellitus without complication ([place])   Sarcoidosis   Hypertension   Cavitary pneumonia   Thrombocytopenia ([facility])   Hyponatremia   LFTs abnormal   Intractable hiccups   Community acquired pneumonia   Cavitary lesion of lung   HCAP (healthcare-associated pneumonia)   Hypoxemia   Pulmonary tuberculosis   Protein-calorie malnutrition, severe    Discharge Instructions   Allergies as of 05/14/2018   No Known Allergies     Medication List    STOP taking these medications   amLODipine 10 MG tablet Commonly known as:  NORVASC   atorvastatin 20 MG tablet Commonly known as:  LIPITOR   glimepiride 4 MG tablet Commonly known as:  AMARYL   losartan 25 MG tablet Commonly known as:  COZAAR   metFORMIN 1000 MG tablet Commonly known as:  GLUCOPHAGE   metoprolol succinate 50 MG 24 hr tablet Commonly known as:  TOPROL-XL    predniSONE 10 MG tablet Commonly known as:  DELTASONE     TAKE these medications   feeding supplement (PRO-STAT SUGAR FREE 64) Liqd Take 30 mLs by mouth 2 (two) times daily.   metoprolol tartrate 25 MG tablet Commonly known as:  LOPRESSOR Take 0.5 tablets (12.5 mg total) by mouth 2 (two) times daily.   pyridOXINE 50 MG tablet Commonly known as:  B-6 Take 1 tablet (50 mg total) by mouth daily.            Durable Medical Equipment  (From admission, onward)        Start     Ordered   05/11/18 1521  For home use only DME Bedside commode  Once    Question:  Patient needs a bedside commode to treat with the following condition  Answer:  Ambulatory dysfunction   05/11/18 1520   05/11/18 1521  For home use only DME Walker rolling  Once    Question:  Patient needs a walker to treat with the following condition  Answer:  Ambulatory dysfunction   05/11/18 [place] Follow up.   Why:  Rolling Walker Bed side Commode.  Contact information: [place] [id number] [phone]          No Known Allergies  Consultations:  ID  Hematology    Procedures/Studies: Dg Chest 2 View  Result Date: 05/02/2018 CLINICAL DATA:  67 year old male with cirrhosis. Weakness and loss of appetite. EXAM: CHEST - 2 VIEW COMPARISON:  Chest CT 08/25/2017, radiographs 06/25/2016. FINDINGS: Semi upright AP and lateral views of the chest. Diffuse reticulonodular pulmonary opacity with an upper lobe predominance, and some semi confluent areas of upper lobe involvement. No superimposed pneumothorax  or pleural effusion. Mediastinal contours remain normal. Visualized tracheal air column is within normal limits. Chronic postoperative changes to the left scapula. No acute osseous abnormality identified. Negative visible bowel gas pattern. IMPRESSION: Diffuse pulmonary reticulonodular opacity with some mildly confluent areas in the  bilateral upper lobes. No pleural effusion. The appearance is nonspecific. Favor acute viral/atypical respiratory infection. Characterization with Chest CT (noncontrast probably would suffice) may be valuable. Electronically Signed   By: Genevie Ann M.D.   On: 05/02/2018 16:30   Ct Angio Chest Pe W And/or Wo Contrast  Result Date: 05/02/2018 CLINICAL DATA:  Three weeks of decreased appetite and generalized weakness. Shortness of breath with exertion. No fever. EXAM: CT ANGIOGRAPHY CHEST WITH CONTRAST TECHNIQUE: Multidetector CT imaging of the chest was performed using the standard protocol during bolus administration of intravenous contrast. Multiplanar CT image reconstructions and MIPs were obtained to evaluate the vascular anatomy. CONTRAST:  124m ISOVUE-370 IOPAMIDOL (ISOVUE-370) INJECTION 76% COMPARISON:  Chest x-ray May 02, 2018.  Chest CT September 02, 2017 FINDINGS: Cardiovascular: The thoracic aorta demonstrates atherosclerotic change with no aneurysm or dissection. Stairstep artifact is seen, particularly in the lung bases, limiting evaluation for pulmonary emboli. Taking the artifact into account, there is no convincing evidence of pulmonary emboli. Mediastinum/Nodes: The thyroid is normal. There is a prominent subcarinal node which measures 18 mm and is stable. Shotty nodes in the AP window are stable. There is a prominent right hilar node which was not well assessed on the previous unenhanced study. Lungs/Pleura: Central airways are normal. No pneumothorax. Bilateral pulmonary infiltrates consisting of both numerous small nodules and more confluent regions. The infiltrates are most prominent in the upper lobes with cavitary rounded regions as seen on coronal image 74 measuring up to 3.6 cm in the right upper lobe, coronal image 67 in the left apex measuring up to 1.9 cm, and coronal image 71 in the left upper lobe measuring up to 1.9 cm. The right middle lobe and lower lobes are involve as well but less  severely. No other acute abnormalities identified within the lung parenchyma. Upper Abdomen: Thickening of the right adrenal gland is stable, likely hyperplasia. Increased attenuation in the fat of the omentum within the left upper quadrant is new and nonspecific. Probable minimal ascites. Musculoskeletal: No chest wall abnormality. No acute or significant osseous findings. Review of the MIP images confirms the above findings. IMPRESSION: 1. Bilateral pulmonary infiltrates most prominent in the upper lobes with rounded regions of cavitation in the bilateral apices. Findings are most consistent with an infectious or inflammatory process. Atypical infections such as tuberculosis should be considered. Non tuberculous mycobacterial infections and fungal infections are also possible. Malignancy is considered less likely. Inflammatory causes such as sarcoidosis could also present with upper lobe cavitating nodules. There are a few scattered prominent nodes in the mediastinum which are stable. There is a prominent right hilar node which was not well assessed on previous unenhanced imaging. 2. Atherosclerotic change in the thoracic aorta. 3. Evaluation for pulmonary emboli is limited due to artifact but no emboli are identified. Findings called to the patient's ER doctor, Dr. PMaryan Rued Aortic Atherosclerosis (ICD10-I70.0). Electronically Signed   By: DDorise BullionIII M.D   On: 05/02/2018 18:02   Ct Abdomen Pelvis W Contrast  Result Date: 05/05/2018 CLINICAL DATA:  Abdominal pain. Fever. Abscess suspected. Unintentional weight loss. EXAM: CT ABDOMEN AND PELVIS WITH CONTRAST TECHNIQUE: Multidetector CT imaging of the abdomen and pelvis was performed using the standard protocol following bolus  administration of intravenous contrast. CONTRAST:  100 mL ISOVUE-300 IOPAMIDOL (ISOVUE-300) INJECTION 61%, 185m OMNIPAQUE IOHEXOL 300 MG/ML SOLN COMPARISON:  01/11/2018 FINDINGS: Lower chest: Lung bases show numerous  centrilobular nodular opacities, new since the prior exam. There is a small area patchy ground-glass opacity the posterolateral right middle lobe. Minimal right pleural effusion. There is dependent opacity in the posterior right lower lobe adjacent to the pleural effusion that is most likely atelectasis. Hepatobiliary: Liver normal in size and attenuation with no mass or focal lesion. There is gallbladder wall edema and pericholecystic fluid. No visible gallstones. No bile duct dilation. Pancreas: Unremarkable. No pancreatic ductal dilatation or surrounding inflammatory changes. Spleen: Normal in size without focal abnormality. Adrenals/Urinary Tract: 2.6 cm right adrenal mass, stable. Normal left adrenal gland. Subtle areas of relative hypoattenuation in both kidneys, most apparent in the lateral midpole of the left kidney. These may reflect areas bright is. There are new since prior exam stable 11 mm lower pole left renal mass consistent with a cyst. No other discrete renal masses. No stones. No hydronephrosis. Ureters are normal course and in caliber. Bladder is unremarkable. Stomach/Bowel: Stomach is unremarkable. No bowel dilation or wall thickening. No convincing inflammation. Normal appendix visualized. Vascular/Lymphatic: No pathologically enlarged lymph nodes. Aortic atherosclerosis. No aneurysm. Reproductive: Unremarkable. Other: Small amount of ascites, less than was present on the prior CT. There is no defined collection to suggest an abscess. Hazy opacities noted throughout the peritoneal and omental fat similar to the prior exam. Calcified peritoneal soft tissue mass in the left central abdomen adjacent to a loop of small bowel is stable from the prior study. Musculoskeletal: No fracture or acute finding. No osteoblastic or osteolytic lesions. IMPRESSION: 1. No evidence of an abscess. 2. There are new bilateral centrilobular pulmonary nodules. Although the differential diagnosis is relatively broad,  since these are new, infection with endobronchial spread such as from non tubercular mycobacteria or aspergillosis is suspected. Hypersensitivity pneumonitis is an alternative diagnosis. This may be the source of fever. 3. Ascites, decreased when compared the prior CT. 4. Areas of subtle relative decreased attenuation in both kidneys, which could be due to pyelonephritis. No evidence of a renal or perirenal abscess. 5. Gallbladder wall thickening that is most likely nonspecific edema related to ascites. 6. No evidence of bowel inflammation. 7. Stable calcified mesenteric nodule. Electronically Signed   By: DLajean ManesM.D.   On: 05/05/2018 07:20   Dg Chest Port 1 View  Result Date: 05/10/2018 CLINICAL DATA:  Shortness of breath. EXAM: PORTABLE CHEST 1 VIEW COMPARISON:  Chest CT 05/02/2018 and chest radiograph 05/02/2018. FINDINGS: Cardiomediastinal silhouette is normal. Mediastinal contours appear intact. Low lung volumes. Worsening aeration of the lungs with increasing bilateral upper lobe predominant airspace consolidation, and cavitary spaces in the upper lobes. Osseous structures are without acute abnormality. Soft tissues are grossly normal. IMPRESSION: Worsening aeration of the lungs with increasing bilateral upper lobe predominant airspace consolidation, and cavitary spaces in the upper lobes. Electronically Signed   By: DFidela SalisburyM.D.   On: 05/10/2018 09:40   Ct Bone Marrow Biopsy & Aspiration  Result Date: 05/10/2018 INDICATION: 67year old male with sarcoidosis, thrombocytopenia an active TB. He presents for bone marrow biopsy and culture. EXAM: CT GUIDED BONE MARROW ASPIRATION AND CORE BIOPSY Interventional Radiologist:  HCriselda Peaches MD MEDICATIONS: None. ANESTHESIA/SEDATION: Moderate (conscious) sedation was employed during this procedure. A total of 1.5 milligrams versed and 50 micrograms fentanyl were administered intravenously. The patient's level of consciousness and vital  signs were monitored continuously by radiology nursing throughout the procedure under my direct supervision. Total monitored sedation time: 10 minutes FLUOROSCOPY TIME:  Fluoroscopy Time: 0 minutes 0 seconds (0 mGy). COMPLICATIONS: None immediate. Estimated blood loss: <25 mL PROCEDURE: Informed written consent was obtained from the patient after a thorough discussion of the procedural risks, benefits and alternatives. All questions were addressed. Maximal Sterile Barrier Technique was utilized including caps, mask, sterile gowns, sterile gloves, sterile drape, hand hygiene and skin antiseptic. A timeout was performed prior to the initiation of the procedure. The patient was positioned prone and non-contrast localization CT was performed of the pelvis to demonstrate the iliac marrow spaces. Maximal barrier sterile technique utilized including caps, mask, sterile gowns, sterile gloves, large sterile drape, hand hygiene, and betadine prep. Under sterile conditions and local anesthesia, an 11 gauge coaxial bone biopsy needle was advanced into the right iliac marrow space. Needle position was confirmed with CT imaging. Initially, bone marrow aspiration was performed. Next, the 11 gauge outer cannula was utilized to obtain a right iliac bone marrow core biopsy. Needle was removed. Hemostasis was obtained with compression. The patient tolerated the procedure well. Samples were prepared with the cytotechnologist. IMPRESSION: Technically successful CT-guided bone marrow biopsy, aspiration and bone culture. Signed, Criselda Peaches, MD [facility] Electronically Signed   By: Jacqulynn Cadet M.D.   On: 05/10/2018 09:53   US Abdomen Limited Ruq  Result Date: 05/09/2018 CLINICAL DATA:  Abnormal LFTs EXAM: ULTRASOUND ABDOMEN LIMITED RIGHT UPPER QUADRANT COMPARISON:  CT 05/05/2018 FINDINGS: Gallbladder: Marked gallbladder wall thickening measuring up to 9 mm. No  stones or sonographic Murphy's sign. Common bile duct: Diameter: Normal caliber, 4 mm Liver: No focal lesion identified. Within normal limits in parenchymal echogenicity. Portal vein is patent on color Doppler imaging with normal direction of blood flow towards the liver. Also noted is a right pleural effusion and ascites. IMPRESSION: Markedly thickened gallbladder wall. No gallstones visualized. This could be related to liver disease or cholecystitis. Right pleural effusion, mild perihepatic ascites. Electronically Signed   By: Rolm Baptise M.D.   On: 05/09/2018 08:31       Subjective: Patient feeling very weak and deconditioned, poor appetite, no nausea, no vomiting.  No dyspnea or chest pain, no cough or hemoptysis.  Discharge Exam: Vitals:   05/14/18 0850 05/14/18 0913  BP: (!) 148/129   Pulse: 98   Resp: 20   Temp:  97.7 F (36.5 C)  SpO2: 96%    Vitals:   05/13/18 2044 05/14/18 0610 05/14/18 0850 05/14/18 0913  BP: 105/76 108/78 (!) 148/129   Pulse: 86 77 98   Resp: 20 20 20    Temp:  97.6 F (36.4 C)  97.7 F (36.5 C)  TempSrc:  Axillary  Oral  SpO2: 99% 99% 96%   Weight:      Height:        General: Not in pain or dyspnea, deconditioned Neurology: Awake and alert, non focal  E ENT: psotive pallor, no icterus, oral mucosa moist Cardiovascular: No JVD. S1-S2 present, rhythmic, no gallops, rubs, or murmurs. No lower extremity edema. Pulmonary: decreased breath sounds bilaterally due to poor inspiratory effort, no wheezing, rhonchi or rales. Gastrointestinal. Abdomen flat, no organomegaly, non tender, no rebound or guarding Skin. No rashes Musculoskeletal: no joint deformities   The results of significant diagnostics from this hospitalization (including imaging, microbiology, ancillary and laboratory) are listed below for reference.     Microbiology: Recent  Results (from the past 240 hour(s))  Acid Fast Smear (AFB)     Status: None   Collection Time: 05/06/18  2:53  PM  Result Value Ref Range Status   AFB Specimen Processing Concentration  Final   Acid Fast Smear Positive  Final    Comment: RESULT CALLED TO, READ BACK BY AND VERIFIED WITH: E CASTRO RN 2029 05/08/18 A BROWNING (NOTE) 2+, 4-36 acid-fast bacilli per 10 fields at 400X magnification, fluorescent smear REPORTED/ FAXED TO ANGELA B. 06.03.19 AT 1830 -[facility] Performed At: [facility] [place], [place] [id number] Rush Farmer MD LJ:[id number]    Source (AFB) SPUTUM  Final    Comment: Performed at [facility], [place] [address], [place], [place] [address]  Acid Fast Smear (AFB)     Status: None   Collection Time: 05/10/18  9:02 AM  Result Value Ref Range Status   AFB Specimen Processing Comment  Final    Comment: Tissue Grinding and Digestion/Decontamination   Acid Fast Smear Negative  Final    Comment: (NOTE) Performed At: [facility] [place], [place] [id number] Rush Farmer MD ME:[id number]    Source (AFB) BONE MARROW  Final    Comment: Performed at [facility], [place] [address] [place], [place] [address]     Labs: BNP (last 3 results) No results for input(s): BNP in the last 8760 hours. Basic Metabolic Panel: Recent Labs  Lab 05/08/18 0755 05/09/18 0406 05/10/18 0357 05/12/18 0644 05/14/18 0717  NA 139 136 136 136 140  K 3.5 3.7 3.7 3.5 3.4*  CL 105 105 109 110 109  CO2 24 20* 23 20* 21*  GLUCOSE 94 145* 110* 94 80  BUN 13 13 10 9 9   CREATININE 0.79 0.84 0.78 0.76 0.58*  CALCIUM 8.6* 8.7* 8.3* 8.2* 8.1*   Liver Function Tests: Recent Labs  Lab 05/08/18 0755 05/08/18 1846 05/09/18 0406 05/10/18 0357 05/12/18 0644  AST 174*  --  154* 124* 67*  ALT 96*  --  83* 72* 47  ALKPHOS 925*  --  1,111* 963* 684*  BILITOT 4.0* 1.6* 2.2* 1.5* 2.4*  PROT 4.5*  --  3.7* 4.3* 4.6*  ALBUMIN 1.5*  --  1.4* 1.5* 1.7*   No results for input(s): LIPASE, AMYLASE in the last 168 hours. No results for input(s): AMMONIA in  the last 168 hours. CBC: Recent Labs  Lab 05/08/18 0755 05/09/18 0406 05/10/18 1039 05/12/18 0644 05/14/18 0717  WBC 4.8 4.4 5.7 6.2 4.8  NEUTROABS 4.6 4.1  --  5.5 4.1  HGB 13.1 12.0* 11.5* 11.7* 12.3*  HCT 39.3 36.2* 35.0* 35.4* 38.3*  MCV 76.6* 76.5* 79.0 77.8* 79.5  PLT 25*  26* 32* 43* 53* 82*   Cardiac Enzymes: No results for input(s): CKTOTAL, CKMB, CKMBINDEX, TROPONINI in the last 168 hours. BNP: Invalid input(s): POCBNP CBG: Recent Labs  Lab 05/13/18 0819 05/13/18 1157 05/13/18 1640 05/13/18 2117 05/14/18 0806  GLUCAP 80 64* 118* 222* 78   D-Dimer No results for input(s): DDIMER in the last 72 hours. Hgb A1c Recent Labs    05/12/18 0644  HGBA1C 11.8*   Lipid Profile No results for input(s): CHOL, HDL, LDLCALC, TRIG, CHOLHDL, LDLDIRECT in the last 72 hours. Thyroid function studies No results for input(s): TSH, T4TOTAL, T3FREE, THYROIDAB in the last 72 hours.  Invalid input(s): FREET3 Anemia work up No results for input(s): VITAMINB12, FOLATE, FERRITIN, TIBC, IRON, RETICCTPCT in the last 72 hours. Urinalysis  Component Value Date/Time   COLORURINE AMBER (A) 05/02/2018 1631   APPEARANCEUR HAZY (A) 05/02/2018 1631   LABSPEC 1.020 05/02/2018 1631   PHURINE 5.0 05/02/2018 1631   GLUCOSEU NEGATIVE 05/02/2018 1631   HGBUR NEGATIVE 05/02/2018 1631   BILIRUBINUR NEGATIVE 05/02/2018 1631   KETONESUR NEGATIVE 05/02/2018 1631   PROTEINUR 30 (A) 05/02/2018 1631   UROBILINOGEN 0.2 02/04/2010 1718   NITRITE NEGATIVE 05/02/2018 1631   LEUKOCYTESUR NEGATIVE 05/02/2018 1631   Sepsis Labs Invalid input(s): PROCALCITONIN,  WBC,  LACTICIDVEN Microbiology Recent Results (from the past 240 hour(s))  Acid Fast Smear (AFB)     Status: None   Collection Time: 05/06/18  2:53 PM  Result Value Ref Range Status   AFB Specimen Processing Concentration  Final   Acid Fast Smear Positive  Final    Comment: RESULT CALLED TO, READ BACK BY AND VERIFIED WITH: E CASTRO RN  2029 05/08/18 A BROWNING (NOTE) 2+, 4-36 acid-fast bacilli per 10 fields at 400X magnification, fluorescent smear REPORTED/ FAXED TO ANGELA B. 06.03.19 AT 1830 -[facility] Performed At: [facility] [place], [place] [id number] Rush Farmer MD UD:[id number]    Source (AFB) SPUTUM  Final    Comment: Performed at [facility], [place] [address] [place], [place] [address]  Acid Fast Smear (AFB)     Status: None   Collection Time: 05/10/18  9:02 AM  Result Value Ref Range Status   AFB Specimen Processing Comment  Final    Comment: Tissue Grinding and Digestion/Decontamination   Acid Fast Smear Negative  Final    Comment: (NOTE) Performed At: [facility] [place], [place] [id number] Rush Farmer MD UN:[id number]    Source (AFB) BONE MARROW  Final    Comment: Performed at [facility], [place] [address], [place], [place] [address]     Time coordinating discharge: 45 minutes  SIGNED:   Tawni Millers, MD  [facility] 05/14/2018, 12:46 PM Pager [phone]  If 7PM-7AM, please contact night-coverage [email or web address] Password [email or web address]

## 2018-05-15 ENCOUNTER — Other Ambulatory Visit: Payer: Self-pay

## 2018-05-15 LAB — HEPATIC FUNCTION PANEL
ALK PHOS: 518 U/L — AB (ref 38–126)
ALT: 37 U/L (ref 17–63)
AST: 65 U/L — ABNORMAL HIGH (ref 15–41)
Albumin: 1.7 g/dL — ABNORMAL LOW (ref 3.5–5.0)
BILIRUBIN DIRECT: 0.8 mg/dL — AB (ref 0.1–0.5)
BILIRUBIN INDIRECT: 0.7 mg/dL (ref 0.3–0.9)
TOTAL PROTEIN: 4.5 g/dL — AB (ref 6.5–8.1)
Total Bilirubin: 1.5 mg/dL — ABNORMAL HIGH (ref 0.3–1.2)

## 2018-05-15 LAB — GLUCOSE, CAPILLARY
GLUCOSE-CAPILLARY: 76 mg/dL (ref 65–99)
Glucose-Capillary: 72 mg/dL (ref 65–99)

## 2018-05-15 NOTE — Patient Outreach (Signed)
[name]) Care Management  05/15/2018  Jeffrey Hayes 1951/08/23 [id number]   Patient referred from hospital. Patient does not qualify for [facility] at this time.  RN CM will  Notify care management assistant and close case.    Jone Baseman, RN, MSN [facility] Care Management Care Management Coordinator Direct Line [phone] Toll Free: [phone]  Fax: [phone]

## 2018-05-15 NOTE — Progress Notes (Signed)
Pt vitals 102.7-126-22-91/68. Arby Barrette, NP notified. Orders received. Will continue to monitor.

## 2018-05-15 NOTE — Progress Notes (Signed)
Occupational Therapy Treatment Patient Details Name: Jeffrey Hayes MRN: [id number] DOB: 24-Feb-1951 Today's Date: 05/15/2018    History of present illness 67 y.o. male with medical history significant for hypertension, recent diagnosis of diabetes, and sarcoidosis with intra-abdominal manifestations status post recent course of prednisone, presenting to the emergency department for evaluation of poor appetite with weight loss, palpitations, and exertional dyspnea over 3 weeks. CT scan was performed which shows extensive cavitary pneumonia and infiltrates. Workup being completed for tuberculosis vs Mycobacterium   OT comments  Pt making progress with functional goals. Pt fatigues easily but motivated for OOB activity. Pt's wife present and assistde pt with toileting and dressing. OT will continue to follow acutley  Follow Up Recommendations  CIR;Supervision/Assistance - 24 hour(planning to d/ home)    Equipment Recommendations  None recommended by OT    Recommendations for Other Services      Precautions / Restrictions Precautions Precautions: Fall Precaution Comments: airborne precautions Restrictions Weight Bearing Restrictions: No       Mobility Bed Mobility Overal bed mobility: Needs Assistance Bed Mobility: Supine to Sit     Supine to sit: Min assist     General bed mobility comments: min assist for trunk elevation  Transfers Overall transfer level: Needs assistance Equipment used: Rolling walker (2 wheeled) Transfers: Sit to/from Stand Sit to Stand: Min guard;Min assist Stand pivot transfers: Min guard       General transfer comment: cues for correct hand placement    Balance Overall balance assessment: Needs assistance Sitting-balance support: Feet supported;No upper extremity supported Sitting balance-Leahy Scale: Good   Postural control: Posterior lean Standing balance support: Bilateral upper extremity supported;During functional activity Standing  balance-Leahy Scale: Fair                             ADL either performed or assessed with clinical judgement   ADL Overall ADL's : Needs assistance/impaired     Grooming: Wash/dry hands;Wash/dry face;Standing;Min guard           Upper Body Dressing : Sitting;Min guard;With caregiver independent assisting   Lower Body Dressing: Maximal assistance;Sit to/from stand;Moderate assistance;With caregiver independent assisting   Toilet Transfer: Minimal assistance;Ambulation;RW;Comfort height toilet;Grab bars;With caregiver independent assisting   Toileting- Clothing Manipulation and Hygiene: Maximal assistance;Sit to/from stand;With caregiver independent assisting       Functional mobility during ADLs: Minimal assistance;Rolling walker;Caregiver able to provide necessary level of assistance       Vision Patient Visual Report: No change from baseline     Perception     Praxis      Cognition Arousal/Alertness: Awake/alert Behavior During Therapy: WFL for tasks assessed/performed Overall Cognitive Status: Impaired/Different from baseline                                 General Comments: pt demonstrating delayed processing at times, requires increased time and occasional tactile cues to follow one step commands        Exercises     Shoulder Instructions       General Comments      Pertinent Vitals/ Pain       Pain Assessment: No/denies pain Pain Score: 0-No pain Pain Intervention(s): Monitored during session  Home Living  Prior Functioning/Environment              Frequency  Min 2X/week        Progress Toward Goals  OT Goals(current goals can now be found in the care plan section)  Progress towards OT goals: Progressing toward goals     Plan Discharge plan needs to be updated    Co-evaluation                 AM-PAC PT "6 Clicks" Daily Activity      Outcome Measure   Help from another person eating meals?: A Little Help from another person taking care of personal grooming?: A Little Help from another person toileting, which includes using toliet, bedpan, or urinal?: A Lot Help from another person bathing (including washing, rinsing, drying)?: A Lot Help from another person to put on and taking off regular upper body clothing?: A Little Help from another person to put on and taking off regular lower body clothing?: A Lot 6 Click Score: 15    End of Session Equipment Utilized During Treatment: Gait belt;Rolling walker;Other (comment)(3 in 1)  OT Visit Diagnosis: Muscle weakness (generalized) (M62.81);Unsteadiness on feet (R26.81)   Activity Tolerance Patient tolerated treatment well   Patient Left in chair;with call bell/phone within reach;with family/visitor present   Nurse Communication      Functional Assessment Tool Used: AM-PAC 6 Clicks Daily Activity   Time: 2536-6440 OT Time Calculation (min): 24 min  Charges: OT G-codes **NOT FOR INPATIENT CLASS** Functional Assessment Tool Used: AM-PAC 6 Clicks Daily Activity OT General Charges $OT Visit: 1 Visit OT Treatments $Self Care/Home Management : 8-22 mins $Therapeutic Activity: 8-22 mins     Britt Bottom 05/15/2018, 2:29 PM

## 2018-05-15 NOTE — Care Management (Addendum)
CM notified Evelena Asa with [facility] Department and informed her that pt will discharge home today.  CM requested bedside nurse to also contact Cassandra when pt leaves the facility per [facility] request.  Bedside nurse will administer TB medication prior to discharge today and [facility] will ensure that pt gets the remainder for specified treatment course.  H A1C collected 6/7.  Per [facility] CM does not need to forward/supply any documentation to agency prior to discharge.  Pt will discharge home in care of wife via private vehicle - wife/pt informed CM that they were aware of the barriers with [facility] and stated they understood that PCP will need to arrange when appropriate, pt and wife will follow up with PCP.  Per wife/bedside nurse pt has been ambulatory in hospital room with the aid of walker.  CM reitereated with attending that [facility] services can not be arranged at this time due to pt still being considered infectious.  [facility] liaison/Attending informed CM that pts PCP or ID  will monitor infectious state and when pt is deemed "non infectious" PCP/ID will then arrange HH.  Attending will reach out to PCP and ID regarding follow ups including HH. Marland Kitchen    CM confirmed with [facility] that DME referral accepted and equipment will be delivered to room prior to discharge

## 2018-05-15 NOTE — Progress Notes (Signed)
Patient will be discharged home today, follow-up with the health department.  Physical therapy and home health has been arranged.  Ideally patient should not go to his primary care physician office until TB is further controlled, try to to address further questions over the phone. Patient is feeling better, still very weak and deconditioned, his vital signs have remained stable, he has remained afebrile.  Unchanged physical examination.

## 2018-05-15 NOTE — Progress Notes (Signed)
    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    Physical Exam: Constitutional:  Vitals:   05/15/18 0417 05/15/18 0841  BP: 112/73 116/85  Pulse: 82 95  Resp: 16 18  Temp:  98.9 F (37.2 C)  SpO2: 99% 100%   patient appears in NAD     Review of Systems: Constitutional: negative for fevers and chills  Lab Results  Component Value Date   WBC 4.8 05/14/2018   HGB 12.3 (L) 05/14/2018   HCT 38.3 (L) 05/14/2018   MCV 79.5 05/14/2018   PLT 82 (L) 05/14/2018    Lab Results  Component Value Date   CREATININE 0.58 (L) 05/14/2018   BUN 9 05/14/2018   NA 140 05/14/2018   K 3.4 (L) 05/14/2018   CL 109 05/14/2018   CO2 21 (L) 05/14/2018    Lab Results  Component Value Date   ALT 37 05/15/2018   AST 65 (H) 05/15/2018   ALKPHOS 518 (H) 05/15/2018     Microbiology: Recent Results (from the past 240 hour(s))  Acid Fast Smear (AFB)     Status: None   Collection Time: 05/06/18  2:53 PM  Result Value Ref Range Status   AFB Specimen Processing Concentration  Final   Acid Fast Smear Positive  Final    Comment: RESULT CALLED TO, READ BACK BY AND VERIFIED WITH: E CASTRO RN 2029 05/08/18 A BROWNING (NOTE) 2+, 4-36 acid-fast bacilli per 10 fields at 400X magnification, fluorescent smear REPORTED/ FAXED TO ANGELA B. 06.03.19 AT 1830 -VH Performed At: [facility] [place], [place] [id number] Rush Farmer MD AT:[id number]    Source (AFB) SPUTUM  Final    Comment: Performed at [facility], [place] [address], [place], [place] [address]  Acid Fast Smear (AFB)     Status: None   Collection Time: 05/10/18  9:02 AM  Result Value Ref Range Status   AFB Specimen Processing Comment  Final    Comment: Tissue Grinding and Digestion/Decontamination   Acid Fast Smear Negative  Final    Comment:  (NOTE) Performed At: [facility] [place], [place] [id number] Rush Farmer MD VO:[id number]    Source (AFB) BONE MARROW  Final    Comment: Performed at [facility], [place] [address] [place], [place] [address]    Impression/Plan:  1. 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 Medication monitoring - improving LFTs, improving platelets.  Will be following with the HD.

## 2018-05-15 NOTE — Progress Notes (Signed)
Jeffrey Hayes to be D/C'd Home per MD order.  Discussed prescriptions and follow up appointments with the patient. Prescriptions given to patient, medication list explained in detail. Pt verbalized understanding. Explained to wife that pt would have to be cleared by PCP before home health could be started.   Allergies as of 05/15/2018   No Known Allergies     Medication List    STOP taking these medications   amLODipine 10 MG tablet Commonly known as:  NORVASC   atorvastatin 20 MG tablet Commonly known as:  LIPITOR   glimepiride 4 MG tablet Commonly known as:  AMARYL   losartan 25 MG tablet Commonly known as:  COZAAR   metFORMIN 1000 MG tablet Commonly known as:  GLUCOPHAGE   metoprolol succinate 50 MG 24 hr tablet Commonly known as:  TOPROL-XL   predniSONE 10 MG tablet Commonly known as:  DELTASONE     TAKE these medications   feeding supplement (PRO-STAT SUGAR FREE 64) Liqd Take 30 mLs by mouth 2 (two) times daily.   metoprolol tartrate 25 MG tablet Commonly known as:  LOPRESSOR Take 0.5 tablets (12.5 mg total) by mouth 2 (two) times daily. Notes to patient:  Take evening dose   pyridOXINE 50 MG tablet Commonly known as:  B-6 Take 1 tablet (50 mg total) by mouth daily.            Durable Medical Equipment  (From admission, onward)        Start     Ordered   05/11/18 1521  For home use only DME Bedside commode  Once    Question:  Patient needs a bedside commode to treat with the following condition  Answer:  Ambulatory dysfunction   05/11/18 1520   05/11/18 1521  For home use only DME Walker rolling  Once    Question:  Patient needs a walker to treat with the following condition  Answer:  Ambulatory dysfunction   05/11/18 1520      Vitals:   05/15/18 0417 05/15/18 0841  BP: 112/73 116/85  Pulse: 82 95  Resp: 16 18  Temp:  98.9 F (37.2 C)  SpO2: 99% 100%    Skin clean, dry and intact without evidence of skin break down, no evidence of skin  tears noted. IV catheter discontinued intact. Site without signs and symptoms of complications. Dressing and pressure applied. Pt denies pain at this time. No complaints noted.  An After Visit Summary was printed and given to the patient. Patient escorted via [place], and D/C home via private auto.  Emilio Math, RN [facility] 74M Phone [phone]

## 2018-05-19 ENCOUNTER — Encounter (HOSPITAL_COMMUNITY): Payer: Self-pay | Admitting: Hematology & Oncology

## 2018-05-23 ENCOUNTER — Emergency Department (HOSPITAL_COMMUNITY): Payer: Medicare Other

## 2018-05-23 ENCOUNTER — Encounter (HOSPITAL_COMMUNITY): Payer: Self-pay | Admitting: Internal Medicine

## 2018-05-23 ENCOUNTER — Inpatient Hospital Stay (HOSPITAL_COMMUNITY)
Admission: EM | Admit: 2018-05-23 | Discharge: 2018-06-05 | DRG: 871 | Disposition: A | Discharge status: Hospice | Payer: Medicare Other | Attending: Internal Medicine | Admitting: Internal Medicine

## 2018-05-23 DIAGNOSIS — E162 Hypoglycemia, unspecified: Secondary | ICD-10-CM | POA: Diagnosis not present

## 2018-05-23 DIAGNOSIS — Z87891 Personal history of nicotine dependence: Secondary | ICD-10-CM

## 2018-05-23 DIAGNOSIS — D6959 Other secondary thrombocytopenia: Secondary | ICD-10-CM | POA: Diagnosis present

## 2018-05-23 DIAGNOSIS — G8929 Other chronic pain: Secondary | ICD-10-CM | POA: Diagnosis not present

## 2018-05-23 DIAGNOSIS — A199 Miliary tuberculosis, unspecified: Secondary | ICD-10-CM

## 2018-05-23 DIAGNOSIS — R0602 Shortness of breath: Secondary | ICD-10-CM | POA: Diagnosis not present

## 2018-05-23 DIAGNOSIS — R0902 Hypoxemia: Secondary | ICD-10-CM | POA: Diagnosis not present

## 2018-05-23 DIAGNOSIS — K219 Gastro-esophageal reflux disease without esophagitis: Secondary | ICD-10-CM | POA: Diagnosis present

## 2018-05-23 DIAGNOSIS — Z862 Personal history of diseases of the blood and blood-forming organs and certain disorders involving the immune mechanism: Secondary | ICD-10-CM | POA: Diagnosis not present

## 2018-05-23 DIAGNOSIS — E119 Type 2 diabetes mellitus without complications: Secondary | ICD-10-CM

## 2018-05-23 DIAGNOSIS — Z8719 Personal history of other diseases of the digestive system: Secondary | ICD-10-CM | POA: Diagnosis not present

## 2018-05-23 DIAGNOSIS — Z8619 Personal history of other infectious and parasitic diseases: Secondary | ICD-10-CM | POA: Diagnosis not present

## 2018-05-23 DIAGNOSIS — A419 Sepsis, unspecified organism: Secondary | ICD-10-CM | POA: Diagnosis not present

## 2018-05-23 DIAGNOSIS — E43 Unspecified severe protein-calorie malnutrition: Secondary | ICD-10-CM | POA: Diagnosis not present

## 2018-05-23 DIAGNOSIS — R918 Other nonspecific abnormal finding of lung field: Secondary | ICD-10-CM | POA: Diagnosis not present

## 2018-05-23 DIAGNOSIS — I10 Essential (primary) hypertension: Secondary | ICD-10-CM | POA: Diagnosis present

## 2018-05-23 DIAGNOSIS — A150 Tuberculosis of lung: Secondary | ICD-10-CM

## 2018-05-23 DIAGNOSIS — E876 Hypokalemia: Secondary | ICD-10-CM | POA: Diagnosis present

## 2018-05-23 DIAGNOSIS — D696 Thrombocytopenia, unspecified: Secondary | ICD-10-CM | POA: Diagnosis present

## 2018-05-23 DIAGNOSIS — G9341 Metabolic encephalopathy: Secondary | ICD-10-CM | POA: Diagnosis not present

## 2018-05-23 DIAGNOSIS — R112 Nausea with vomiting, unspecified: Secondary | ICD-10-CM | POA: Diagnosis not present

## 2018-05-23 DIAGNOSIS — Z931 Gastrostomy status: Secondary | ICD-10-CM | POA: Diagnosis not present

## 2018-05-23 DIAGNOSIS — L89152 Pressure ulcer of sacral region, stage 2: Secondary | ICD-10-CM | POA: Diagnosis present

## 2018-05-23 DIAGNOSIS — E871 Hypo-osmolality and hyponatremia: Secondary | ICD-10-CM | POA: Diagnosis present

## 2018-05-23 DIAGNOSIS — A158 Other respiratory tuberculosis: Secondary | ICD-10-CM | POA: Diagnosis not present

## 2018-05-23 DIAGNOSIS — R627 Adult failure to thrive: Secondary | ICD-10-CM | POA: Diagnosis present

## 2018-05-23 DIAGNOSIS — J189 Pneumonia, unspecified organism: Secondary | ICD-10-CM | POA: Diagnosis not present

## 2018-05-23 DIAGNOSIS — R188 Other ascites: Secondary | ICD-10-CM | POA: Diagnosis not present

## 2018-05-23 DIAGNOSIS — Z743 Need for continuous supervision: Secondary | ICD-10-CM | POA: Diagnosis not present

## 2018-05-23 DIAGNOSIS — J188 Other pneumonia, unspecified organism: Secondary | ICD-10-CM | POA: Diagnosis present

## 2018-05-23 DIAGNOSIS — R197 Diarrhea, unspecified: Secondary | ICD-10-CM | POA: Diagnosis present

## 2018-05-23 DIAGNOSIS — E872 Acidosis: Secondary | ICD-10-CM | POA: Diagnosis present

## 2018-05-23 DIAGNOSIS — R1310 Dysphagia, unspecified: Secondary | ICD-10-CM | POA: Diagnosis not present

## 2018-05-23 DIAGNOSIS — E860 Dehydration: Secondary | ICD-10-CM | POA: Diagnosis present

## 2018-05-23 DIAGNOSIS — Z515 Encounter for palliative care: Secondary | ICD-10-CM

## 2018-05-23 DIAGNOSIS — R279 Unspecified lack of coordination: Secondary | ICD-10-CM | POA: Diagnosis not present

## 2018-05-23 DIAGNOSIS — A159 Respiratory tuberculosis unspecified: Secondary | ICD-10-CM | POA: Diagnosis not present

## 2018-05-23 DIAGNOSIS — Z781 Physical restraint status: Secondary | ICD-10-CM

## 2018-05-23 DIAGNOSIS — R638 Other symptoms and signs concerning food and fluid intake: Secondary | ICD-10-CM | POA: Diagnosis not present

## 2018-05-23 DIAGNOSIS — R509 Fever, unspecified: Secondary | ICD-10-CM

## 2018-05-23 DIAGNOSIS — J90 Pleural effusion, not elsewhere classified: Secondary | ICD-10-CM | POA: Diagnosis present

## 2018-05-23 DIAGNOSIS — Z7952 Long term (current) use of systemic steroids: Secondary | ICD-10-CM | POA: Diagnosis not present

## 2018-05-23 DIAGNOSIS — R000 Tachycardia, unspecified: Secondary | ICD-10-CM | POA: Diagnosis not present

## 2018-05-23 DIAGNOSIS — E46 Unspecified protein-calorie malnutrition: Secondary | ICD-10-CM | POA: Diagnosis not present

## 2018-05-23 DIAGNOSIS — Z8639 Personal history of other endocrine, nutritional and metabolic disease: Secondary | ICD-10-CM | POA: Diagnosis not present

## 2018-05-23 DIAGNOSIS — Z681 Body mass index (BMI) 19 or less, adult: Secondary | ICD-10-CM

## 2018-05-23 DIAGNOSIS — Z8679 Personal history of other diseases of the circulatory system: Secondary | ICD-10-CM | POA: Diagnosis not present

## 2018-05-23 DIAGNOSIS — D509 Iron deficiency anemia, unspecified: Secondary | ICD-10-CM | POA: Diagnosis present

## 2018-05-23 DIAGNOSIS — Z8739 Personal history of other diseases of the musculoskeletal system and connective tissue: Secondary | ICD-10-CM | POA: Diagnosis not present

## 2018-05-23 DIAGNOSIS — L8990 Pressure ulcer of unspecified site, unspecified stage: Secondary | ICD-10-CM

## 2018-05-23 DIAGNOSIS — R066 Hiccough: Secondary | ICD-10-CM | POA: Diagnosis present

## 2018-05-23 DIAGNOSIS — K591 Functional diarrhea: Secondary | ICD-10-CM | POA: Diagnosis not present

## 2018-05-23 DIAGNOSIS — E11649 Type 2 diabetes mellitus with hypoglycemia without coma: Secondary | ICD-10-CM | POA: Diagnosis present

## 2018-05-23 DIAGNOSIS — R0689 Other abnormalities of breathing: Secondary | ICD-10-CM | POA: Diagnosis not present

## 2018-05-23 DIAGNOSIS — B379 Candidiasis, unspecified: Secondary | ICD-10-CM | POA: Diagnosis not present

## 2018-05-23 DIAGNOSIS — Z66 Do not resuscitate: Secondary | ICD-10-CM | POA: Diagnosis present

## 2018-05-23 DIAGNOSIS — D869 Sarcoidosis, unspecified: Secondary | ICD-10-CM | POA: Diagnosis present

## 2018-05-23 DIAGNOSIS — R531 Weakness: Secondary | ICD-10-CM | POA: Diagnosis not present

## 2018-05-23 DIAGNOSIS — Z4659 Encounter for fitting and adjustment of other gastrointestinal appliance and device: Secondary | ICD-10-CM

## 2018-05-23 DIAGNOSIS — R1013 Epigastric pain: Secondary | ICD-10-CM | POA: Diagnosis not present

## 2018-05-23 DIAGNOSIS — Z4682 Encounter for fitting and adjustment of non-vascular catheter: Secondary | ICD-10-CM | POA: Diagnosis not present

## 2018-05-23 LAB — BASIC METABOLIC PANEL
Anion gap: 10 (ref 5–15)
BUN: 9 mg/dL (ref 6–20)
CALCIUM: 7 mg/dL — AB (ref 8.9–10.3)
CO2: 20 mmol/L — AB (ref 22–32)
CREATININE: 1.07 mg/dL (ref 0.61–1.24)
Chloride: 109 mmol/L (ref 101–111)
GFR calc non Af Amer: >60 mL/min (ref >60)
Glucose, Bld: 59 mg/dL — ABNORMAL LOW (ref 65–99)
Potassium: 3.1 mmol/L — ABNORMAL LOW (ref 3.5–5.1)
SODIUM: 139 mmol/L (ref 135–145)

## 2018-05-23 LAB — CBC WITH DIFFERENTIAL/PLATELET
Abs Immature Granulocytes: 0.1 10*3/uL (ref 0.0–0.1)
Basophils Absolute: 0.1 10*3/uL (ref 0.0–0.1)
Basophils Relative: 0 %
Eosinophils Absolute: 0 10*3/uL (ref 0.0–0.7)
Eosinophils Relative: 0 %
HEMATOCRIT: 32 % — AB (ref 39.0–52.0)
HEMOGLOBIN: 10.6 g/dL — AB (ref 13.0–17.0)
IMMATURE GRANULOCYTES: 1 %
LYMPHS ABS: 0.8 10*3/uL (ref 0.7–4.0)
LYMPHS PCT: 4 %
MCH: 25.7 pg — ABNORMAL LOW (ref 26.0–34.0)
MCHC: 33.1 g/dL (ref 30.0–36.0)
MCV: 77.5 fL — ABNORMAL LOW (ref 78.0–100.0)
Monocytes Absolute: 1.3 10*3/uL — ABNORMAL HIGH (ref 0.1–1.0)
Monocytes Relative: 7 %
NEUTROS ABS: 17.4 10*3/uL — AB (ref 1.7–7.7)
NEUTROS PCT: 88 %
Platelets: 130 10*3/uL — ABNORMAL LOW (ref 150–400)
RBC: 4.13 MIL/uL — ABNORMAL LOW (ref 4.22–5.81)
RDW: 16.8 % — ABNORMAL HIGH (ref 11.5–15.5)
WBC: 19.7 10*3/uL — ABNORMAL HIGH (ref 4.0–10.5)

## 2018-05-23 LAB — I-STAT CHEM 8, ED
BUN: 9 mg/dL (ref 6–20)
BUN: 10 mg/dL (ref 6–20)
CHLORIDE: 111 mmol/L (ref 101–111)
Calcium, Ion: 0.78 mmol/L — CL (ref 1.15–1.40)
Calcium, Ion: 1.02 mmol/L — ABNORMAL LOW (ref 1.15–1.40)
Chloride: 104 mmol/L (ref 101–111)
Creatinine, Ser: 0.6 mg/dL — ABNORMAL LOW (ref 0.61–1.24)
Creatinine, Ser: 0.8 mg/dL (ref 0.61–1.24)
Glucose, Bld: 47 mg/dL — ABNORMAL LOW (ref 65–99)
Glucose, Bld: 59 mg/dL — ABNORMAL LOW (ref 65–99)
HEMATOCRIT: 31 % — AB (ref 39.0–52.0)
HEMATOCRIT: 33 % — AB (ref 39.0–52.0)
Hemoglobin: 10.5 g/dL — ABNORMAL LOW (ref 13.0–17.0)
Hemoglobin: 11.2 g/dL — ABNORMAL LOW (ref 13.0–17.0)
Potassium: 3.2 mmol/L — ABNORMAL LOW (ref 3.5–5.1)
Potassium: 2.8 mmol/L — ABNORMAL LOW (ref 3.5–5.1)
SODIUM: 139 mmol/L (ref 135–145)
Sodium: 139 mmol/L (ref 135–145)
TCO2: 17 mmol/L — AB (ref 22–32)
TCO2: 18 mmol/L — AB (ref 22–32)

## 2018-05-23 LAB — COMPREHENSIVE METABOLIC PANEL
ALT: 33 U/L (ref 17–63)
ANION GAP: 10 (ref 5–15)
AST: 108 U/L — ABNORMAL HIGH (ref 15–41)
Albumin: 1.3 g/dL — ABNORMAL LOW (ref 3.5–5.0)
Alkaline Phosphatase: 458 U/L — ABNORMAL HIGH (ref 38–126)
BUN: 8 mg/dL (ref 6–20)
CHLORIDE: 113 mmol/L — AB (ref 101–111)
CO2: 17 mmol/L — AB (ref 22–32)
Calcium: 6.1 mg/dL — CL (ref 8.9–10.3)
Creatinine, Ser: 0.92 mg/dL (ref 0.61–1.24)
GFR calc non Af Amer: >60 mL/min (ref >60)
Glucose, Bld: 55 mg/dL — ABNORMAL LOW (ref 65–99)
POTASSIUM: 2.8 mmol/L — AB (ref 3.5–5.1)
SODIUM: 140 mmol/L (ref 135–145)
Total Bilirubin: 1.1 mg/dL (ref 0.3–1.2)
Total Protein: 3.6 g/dL — ABNORMAL LOW (ref 6.5–8.1)

## 2018-05-23 LAB — CBG MONITORING, ED: GLUCOSE-CAPILLARY: 103 mg/dL — AB (ref 65–99)

## 2018-05-23 LAB — MAGNESIUM: Magnesium: 1.4 mg/dL — ABNORMAL LOW (ref 1.7–2.4)

## 2018-05-23 LAB — I-STAT TROPONIN, ED: Troponin i, poc: 0 ng/mL (ref 0.00–0.08)

## 2018-05-23 LAB — LIPASE, BLOOD: Lipase: 23 U/L (ref 11–51)

## 2018-05-23 LAB — PROTIME-INR
INR: 1.73
PROTHROMBIN TIME: 20.1 s — AB (ref 11.4–15.2)

## 2018-05-23 LAB — I-STAT CG4 LACTIC ACID, ED
Lactic Acid, Venous: 1.52 mmol/L (ref 0.5–1.9)
Lactic Acid, Venous: 1.97 mmol/L — ABNORMAL HIGH (ref 0.5–1.9)

## 2018-05-23 MED ORDER — SODIUM CHLORIDE 0.9 % IV SOLN
1.0000 g | Freq: Once | INTRAVENOUS | Status: AC
  Administered 2018-05-23: 1 g via INTRAVENOUS
  Filled 2018-05-23: qty 10

## 2018-05-23 MED ORDER — SODIUM CHLORIDE 0.9 % IV BOLUS (SEPSIS)
1000.0000 mL | Freq: Once | INTRAVENOUS | Status: AC
  Administered 2018-05-23: 1000 mL via INTRAVENOUS

## 2018-05-23 MED ORDER — ETHAMBUTOL HCL 400 MG PO TABS
1200.0000 mg | ORAL_TABLET | Freq: Every day | ORAL | Status: DC
  Administered 2018-05-24 – 2018-06-05 (×13): 1200 mg via ORAL
  Filled 2018-05-23 (×13): qty 3

## 2018-05-23 MED ORDER — ONDANSETRON HCL 4 MG PO TABS: 4.0000 mg | ORAL_TABLET | Freq: Four times a day (QID) | ORAL | Status: DC | PRN

## 2018-05-23 MED ORDER — RIFAMPIN 300 MG PO CAPS
600.0000 mg | ORAL_CAPSULE | Freq: Every day | ORAL | Status: DC
  Administered 2018-05-24 – 2018-05-31 (×8): 600 mg via ORAL
  Filled 2018-05-23 (×8): qty 2

## 2018-05-23 MED ORDER — PROMETHAZINE HCL 25 MG/ML IJ SOLN
12.5000 mg | Freq: Once | INTRAMUSCULAR | Status: AC
  Administered 2018-05-23: 12.5 mg via INTRAVENOUS
  Filled 2018-05-23: qty 1

## 2018-05-23 MED ORDER — SODIUM CHLORIDE 0.9 % IV SOLN
INTRAVENOUS | Status: DC
  Administered 2018-05-24 – 2018-05-25 (×3): via INTRAVENOUS

## 2018-05-23 MED ORDER — VANCOMYCIN HCL 10 G IV SOLR
1500.0000 mg | Freq: Once | INTRAVENOUS | Status: AC
  Administered 2018-05-23: 1500 mg via INTRAVENOUS
  Filled 2018-05-23: qty 1500

## 2018-05-23 MED ORDER — METOPROLOL TARTRATE 25 MG PO TABS: 12.5000 mg | ORAL_TABLET | Freq: Two times a day (BID) | ORAL | Status: DC

## 2018-05-23 MED ORDER — PIPERACILLIN-TAZOBACTAM 3.375 G IVPB 30 MIN
3.3750 g | Freq: Once | INTRAVENOUS | Status: AC
  Administered 2018-05-23: 3.375 g via INTRAVENOUS
  Filled 2018-05-23: qty 50

## 2018-05-23 MED ORDER — ACETAMINOPHEN 325 MG PO TABS
650.0000 mg | ORAL_TABLET | Freq: Once | ORAL | Status: AC
  Administered 2018-05-23: 650 mg via ORAL
  Filled 2018-05-23: qty 2

## 2018-05-23 MED ORDER — POTASSIUM CHLORIDE IN NACL 20-0.9 MEQ/L-% IV SOLN
INTRAVENOUS | Status: AC
  Administered 2018-05-23 – 2018-05-24 (×2): via INTRAVENOUS
  Filled 2018-05-23 (×2): qty 1000

## 2018-05-23 MED ORDER — ISONIAZID 300 MG PO TABS
300.0000 mg | ORAL_TABLET | Freq: Every day | ORAL | Status: DC
  Administered 2018-05-24 – 2018-06-05 (×13): 300 mg via ORAL
  Filled 2018-05-23 (×13): qty 1

## 2018-05-23 MED ORDER — VANCOMYCIN HCL IN DEXTROSE 750-5 MG/150ML-% IV SOLN
750.0000 mg | Freq: Three times a day (TID) | INTRAVENOUS | Status: DC
  Filled 2018-05-23 (×2): qty 150

## 2018-05-23 MED ORDER — SODIUM CHLORIDE 0.9 % IV SOLN: INTRAVENOUS | Status: DC

## 2018-05-23 MED ORDER — ACETAMINOPHEN 650 MG RE SUPP: 650.0000 mg | Freq: Four times a day (QID) | RECTAL | Status: DC | PRN

## 2018-05-23 MED ORDER — LOSARTAN POTASSIUM 25 MG PO TABS: 25.0000 mg | ORAL_TABLET | Freq: Every day | ORAL | Status: DC

## 2018-05-23 MED ORDER — PYRAZINAMIDE 500 MG PO TABS
1500.0000 mg | ORAL_TABLET | Freq: Every day | ORAL | Status: DC
  Administered 2018-05-24 – 2018-06-05 (×13): 1500 mg via ORAL
  Filled 2018-05-23 (×13): qty 3

## 2018-05-23 MED ORDER — VITAMIN B-6 50 MG PO TABS
50.0000 mg | ORAL_TABLET | Freq: Every day | ORAL | Status: DC
  Administered 2018-05-24 – 2018-05-25 (×2): 50 mg via ORAL
  Filled 2018-05-23 (×2): qty 1

## 2018-05-23 MED ORDER — SODIUM CHLORIDE 0.9 % IV BOLUS (SEPSIS)
250.0000 mL | Freq: Once | INTRAVENOUS | Status: AC
  Administered 2018-05-23: 250 mL via INTRAVENOUS

## 2018-05-23 MED ORDER — ACETAMINOPHEN 325 MG PO TABS
650.0000 mg | ORAL_TABLET | Freq: Four times a day (QID) | ORAL | Status: DC | PRN
  Administered 2018-05-25 – 2018-06-04 (×12): 650 mg via ORAL
  Filled 2018-05-23 (×13): qty 2

## 2018-05-23 MED ORDER — ONDANSETRON HCL 4 MG/2ML IJ SOLN
4.0000 mg | Freq: Four times a day (QID) | INTRAMUSCULAR | Status: DC | PRN
  Administered 2018-05-24: 4 mg via INTRAVENOUS
  Filled 2018-05-23: qty 2

## 2018-05-23 MED ORDER — VANCOMYCIN HCL IN DEXTROSE 1-5 GM/200ML-% IV SOLN: 1000.0000 mg | Freq: Once | INTRAVENOUS | Status: DC

## 2018-05-23 MED ORDER — PIPERACILLIN-TAZOBACTAM 3.375 G IVPB
3.3750 g | Freq: Three times a day (TID) | INTRAVENOUS | Status: DC
  Administered 2018-05-24 – 2018-05-25 (×5): 3.375 g via INTRAVENOUS
  Filled 2018-05-23 (×6): qty 50

## 2018-05-23 MED ORDER — ENOXAPARIN SODIUM 40 MG/0.4ML ~~LOC~~ SOLN
40.0000 mg | SUBCUTANEOUS | Status: DC
  Administered 2018-05-24 – 2018-06-05 (×13): 40 mg via SUBCUTANEOUS
  Filled 2018-05-23 (×13): qty 0.4

## 2018-05-23 MED ORDER — POTASSIUM CHLORIDE 10 MEQ/100ML IV SOLN
10.0000 meq | INTRAVENOUS | Status: DC
  Administered 2018-05-23: 10 meq via INTRAVENOUS
  Filled 2018-05-23: qty 100

## 2018-05-23 MED ORDER — DEXTROSE 50 % IV SOLN
50.0000 mL | Freq: Once | INTRAVENOUS | Status: AC
  Administered 2018-05-23: 50 mL via INTRAVENOUS
  Filled 2018-05-23: qty 50

## 2018-05-23 MED ORDER — MAGNESIUM GLUCONATE 500 MG PO TABS
500.0000 mg | ORAL_TABLET | Freq: Once | ORAL | Status: AC
  Administered 2018-05-23: 500 mg via ORAL
  Filled 2018-05-23: qty 1

## 2018-05-23 MED ORDER — POTASSIUM CHLORIDE 10 MEQ/100ML IV SOLN
10.0000 meq | INTRAVENOUS | Status: AC
  Administered 2018-05-24 (×3): 10 meq via INTRAVENOUS
  Filled 2018-05-23 (×3): qty 100

## 2018-05-23 NOTE — ED Notes (Signed)
Airborne precautions initiated and maintained d/t TB

## 2018-05-23 NOTE — ED Notes (Signed)
X-ray at bedside

## 2018-05-23 NOTE — Progress Notes (Signed)
Pharmacy Antibiotic Note  Jeffrey Hayes is a 67 y.o. male admitted on 05/23/2018 with sepsis.  Pharmacy has been consulted for vancomycin and zosyn dosing.  Patient came to ED with chest discomfort, tachycardic, and febrile. Concern for sepsis. Was in the ED earlier this month complaining of malaise and weight loss. 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 Was placed on four drug regimen with levofloxacin, isoniazid, pyrazinamide, and ethambutol.  Plan: Vancomycin 1500 mg IV x 1 Vancomycin 750 mg IV Q8h Zosyn 3.375 g IV Q8h Monitor clinical progression, BCx and renal function   Temp (24hrs), Avg:99.7 F (37.6 C), Min:99.7 F (37.6 C), Max:99.7 F (37.6 C)  Recent Labs  Lab 05/23/18 1435 05/23/18 1500  WBC 19.7*  --   LATICACIDVEN  --  1.52    Estimated Creatinine Clearance: 88.3 mL/min (A) (by C-G formula based on SCr of 0.58 mg/dL (L)).    No Known Allergies  Antimicrobials this admission: 6/18 Vancomycin >>  6/18 Zosyn >>   Dose adjustments this admission:   Microbiology results: 6/18 BCx:    Leroy Libman, PharmD Pharmacy Resident Pager: [phone]

## 2018-05-23 NOTE — ED Provider Notes (Signed)
[place] EMERGENCY DEPARTMENT Provider Note   CSN: [id number] Arrival date & time: 05/23/18  1417     History   Chief Complaint Chief Complaint  Patient presents with  . Chest Pain  . Fever  . Diarrhea    HPI Jeffrey Hayes is a 67 y.o. male hx of DM, HTN, sarcoidosis, Tuberculosis, here presenting with fever, cough, weakness, persistent hiccups.  Patient states that he was recently admitted to the hospital for pneumonia and was found to have a cavitary lesion on CT and was diagnosed with reactivation TB.  Patient is currently on appropriate antibiotics and has the health department nurse that comes to give him his antibiotics every day.  Patient has been progressively weak over the last several days and has subjective fevers.  Also has poor appetite and vomited every time he ate anything.  Also complains of some abdominal pain as well.  Patient also has some loose stools over the last several days.  Patient also has persistent hiccups and chest pressure since yesterday. Visiting nurse came to see him today and noticed that he is very weak and tachycardic and febrile to 100.9 F and sent here with evaluation. Code sepsis initiated in triage.   The history is provided by the patient.    Past Medical History:  Diagnosis Date  . Arthritis   . Diabetes mellitus without complication ([place])   . Hypertension   . Sarcoidosis     Patient Active Problem List   Diagnosis Date Noted  . Protein-calorie malnutrition, severe 05/13/2018  . 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 Cavitary lesion of lung   . HCAP (healthcare-associated pneumonia)   . Hypoxemia   . Community acquired pneumonia   . Intractable hiccups 05/04/2018  . Severe sepsis ([place]) 05/03/2018  . Diabetes mellitus without complication ([place]) 98/33/8250  . Sarcoidosis 05/02/2018  . Hypertension 05/02/2018  . Cavitary pneumonia 05/02/2018  . Thrombocytopenia ([place]) 05/02/2018  . Hyponatremia 05/02/2018  .  LFTs abnormal 05/02/2018  . Nodule of right lung 04/15/2017    Past Surgical History:  Procedure Laterality Date  . CHALAZION EXCISION Left 03/07/2013   Procedure: EXCISION OF CYST LOWER LEFT EYELID;  Surgeon: Myrtha Mantis., MD;  Location: [place];  Service: Ophthalmology;  Laterality: Left;  . IR PARACENTESIS  01/12/2018  . IR PARACENTESIS  01/20/2018  . IR PARACENTESIS  02/10/2018  . ROTATOR CUFF REPAIR    . SHOULDER SURGERY Left    per pt - had a pin placed.         Home Medications    Prior to Admission medications   Medication Sig Start Date End Date Taking? Authorizing Provider  Amino Acids-Protein Hydrolys (FEEDING SUPPLEMENT, PRO-STAT SUGAR FREE 64,) LIQD Take 30 mLs by mouth 2 (two) times daily. 05/14/18   Arrien, Jimmy Picket, MD  metoprolol tartrate (LOPRESSOR) 25 MG tablet Take 0.5 tablets (12.5 mg total) by mouth 2 (two) times daily. 05/14/18 06/13/18  Arrien, Jimmy Picket, MD  pyridOXINE (B-6) 50 MG tablet Take 1 tablet (50 mg total) by mouth daily. 05/14/18 06/13/18  Arrien, Jimmy Picket, MD    Family History Family History  Problem Relation Age of Onset  . Lung disease Father   . Lung disease Brother   . Alcohol abuse Brother   . Drug abuse Brother     Social History Social History   Tobacco Use  . Smoking status: Former Smoker    Packs/day: 0.75    Years: 50.00  Pack years: 37.50    Types: Cigarettes    Last attempt to quit: 12/06/2016    Years since quitting: 1.4  . Smokeless tobacco: Never Used  Substance Use Topics  . Alcohol use: Not Currently  . Drug use: No     Allergies   Patient has no known allergies.   Review of Systems Review of Systems  Constitutional: Positive for chills and fever.  Respiratory: Positive for shortness of breath.   All other systems reviewed and are negative.    Physical Exam Updated Vital Signs BP 117/76   Pulse (!) 122   Temp 99.7 F (37.6 C) (Oral)   Resp 18   SpO2 95%    Physical Exam  Constitutional: He is oriented to person, place, and time.  Ill appearing, dehydrated, + hiccups   HENT:  Head: Normocephalic.  MM dry   Eyes: Pupils are equal, round, and reactive to light.  Neck: Normal range of motion. Neck supple.  Cardiovascular: Regular rhythm.  Tachycardic   Pulmonary/Chest:  Slightly tachypneic, crackles bilateral bases   Abdominal: Soft.  Mild periumbilical tenderness, no rebound   Musculoskeletal: Normal range of motion.       Right lower leg: Normal.       Left lower leg: Normal.  Neurological: He is alert and oriented to person, place, and time.  Skin: Skin is warm. Capillary refill takes less than 2 seconds.  Psychiatric: He has a normal mood and affect. His behavior is normal.  Nursing note and vitals reviewed.    ED Treatments / Results  Labs (all labs ordered are listed, but only abnormal results are displayed) Labs Reviewed  CBC WITH DIFFERENTIAL/PLATELET - Abnormal; Notable for the following components:      Result Value   WBC 19.7 (*)    RBC 4.13 (*)    Hemoglobin 10.6 (*)    HCT 32.0 (*)    MCV 77.5 (*)    MCH 25.7 (*)    RDW 16.8 (*)    Platelets 130 (*)    Neutro Abs 17.4 (*)    Monocytes Absolute 1.3 (*)    All other components within normal limits  PROTIME-INR - Abnormal; Notable for the following components:   Prothrombin Time 20.1 (*)    All other components within normal limits  CULTURE, BLOOD (ROUTINE X 2)  CULTURE, BLOOD (ROUTINE X 2)  URINE CULTURE  COMPREHENSIVE METABOLIC PANEL  URINALYSIS, ROUTINE W REFLEX MICROSCOPIC  LIPASE, BLOOD  I-STAT CG4 LACTIC ACID, ED  I-STAT TROPONIN, ED    EKG EKG Interpretation  Date/Time:  Tuesday May 23 2018 14:31:21 EDT Ventricular Rate:  132 PR Interval:    QRS Duration: 65 QT Interval:  336 QTC Calculation: 498 R Axis:   20 Text Interpretation:  Sinus tachycardia Atrial premature complex Prolonged PR interval Low voltage QRS Artifact Abnormal ekg  Confirmed by Carmin Muskrat [phone]) on 05/23/2018 3:21:41 PM   Radiology No results found.  Procedures Procedures (including critical care time)  ,CRITICAL CARE Performed by: Wandra Arthurs   Total critical care time: 30 minutes  Critical care time was exclusive of separately billable procedures and treating other patients.  Critical care was necessary to treat or prevent imminent or life-threatening deterioration.  Critical care was time spent personally by me on the following activities: development of treatment plan with patient and/or surrogate as well as nursing, discussions with consultants, evaluation of patient's response to treatment, examination of patient, obtaining history from patient or surrogate, ordering  and performing treatments and interventions, ordering and review of laboratory studies, ordering and review of radiographic studies, pulse oximetry and re-evaluation of patient's condition.   Medications Ordered in ED Medications  sodium chloride 0.9 % bolus 1,000 mL (has no administration in time range)    And  sodium chloride 0.9 % bolus 1,000 mL (has no administration in time range)    And  sodium chloride 0.9 % bolus 250 mL (has no administration in time range)  piperacillin-tazobactam (ZOSYN) IVPB 3.375 g (has no administration in time range)  vancomycin (VANCOCIN) 1,500 mg in sodium chloride 0.9 % 500 mL IVPB (has no administration in time range)  piperacillin-tazobactam (ZOSYN) IVPB 3.375 g (has no administration in time range)  vancomycin (VANCOCIN) IVPB 750 mg/150 ml premix (has no administration in time range)  promethazine (PHENERGAN) injection 12.5 mg (has no administration in time range)     Initial Impression / Assessment and Plan / ED Course  I have reviewed the triage vital signs and the nursing notes.  Pertinent labs & imaging results that were available during my care of the patient were reviewed by me and considered in my medical decision making  (see chart for details).     Jeffrey Hayes is a 68 y.o. male here with cough, fever. Has active TB currently on meds. Now is febrile, tachycardic and has abdominal pain. Concerned for pneumonia vs refractory TB vs pyleonephritis. Will get CBC, CMP, UA, cultures, CXR, CT ab/pel.   6:34 PM Patient's WBC is 19 which is new. He has multiple electrolyte abnormalities- hypokalemia, hypocalcemia, hypoglycemia, hypomagnesemia- that were supplemented. CXR unchanged. CT chest showed new pleural effusion. Concerned for possible bacteremia from pneumonia. I called Dr. Linus Salmons from ID, who recommend IV zosyn or cefepime and continue current abx for TB. Hospitalist to readmit.    Final Clinical Impressions(s) / ED Diagnoses   Final diagnoses:  None    ED Discharge Orders    None       Drenda Freeze, MD 05/23/18 [phone]

## 2018-05-23 NOTE — ED Triage Notes (Signed)
Pt here from home with non-radiating "burning" chest discomfort. Per EMS, patient is possible code sepsis. Tachycardic at 130s, RR 28, temperature 100.9, O2 sat 96% on RA. A/O x4. Denies shortness of breath. Given 552mL NS bolus by EMS.

## 2018-05-23 NOTE — ED Notes (Signed)
Pt states he is voiding with diarrhea

## 2018-05-23 NOTE — ED Notes (Signed)
Patient transported to CT 

## 2018-05-23 NOTE — Progress Notes (Signed)
Received report from ED RN  @ 23:05. Room ready for patient. Desteni Piscopo, Wonda Cheng, Therapist, sports

## 2018-05-23 NOTE — ED Notes (Signed)
Dr Darl Householder informed pt having PVC's, no new orders at this time

## 2018-05-23 NOTE — H&P (Signed)
History and Physical    Jeffrey Hayes GEX:[id number] DOB: May 07, 1951 DOA: 05/23/2018  PCP: [name], MD  Patient coming from: Home.  Chief Complaint: Nausea vomiting and hiccups.  HPI: Jeffrey Hayes is a 67 y.o. male with recently diagnosed pulmonary tuberculosis discharged home on antituberculosis medications presents to the ER with complaints of persistent nausea vomiting and diarrhea over the last few days.  Also has been having hiccups.  Has been feeling weak.  Has not been able to eat anything because of the vomiting.  Patient has been compliant with his anti-tuberculosis medications.  ED Course: In the ER patient is febrile tachycardic with elevated lactate level with labs showing hypokalemia hypomagnesemia.  CT chest abdomen pelvis was done which shows new pleural effusion and also which is concerning for diarrhea.  Blood cultures obtained in ER physician discussed with Dr. Linus Salmons on-call infectious disease consultant who advised patient to be started on Zosyn along with his antiretroviral.  Patient was started on fluids and potassium and magnesium replacement.  Review of Systems: As per HPI, rest all negative.   Past Medical History:  Diagnosis Date  . Arthritis   . Diabetes mellitus without complication ([place])   . Hypertension   . Sarcoidosis     Past Surgical History:  Procedure Laterality Date  . CHALAZION EXCISION Left 03/07/2013   Procedure: EXCISION OF CYST LOWER LEFT EYELID;  Surgeon: Myrtha Mantis., MD;  Location: [facility];  Service: Ophthalmology;  Laterality: Left;  . IR PARACENTESIS  01/12/2018  . IR PARACENTESIS  01/20/2018  . IR PARACENTESIS  02/10/2018  . ROTATOR CUFF REPAIR    . SHOULDER SURGERY Left    per pt - had a pin placed.      reports that he quit smoking about 17 months ago. His smoking use included cigarettes. He has a 37.50 pack-year smoking history. He has never used smokeless tobacco. He reports that he drank alcohol.  He reports that he does not use drugs.  Allergies  Allergen Reactions  . Tape Other (See Comments)    Per the patient's family, his skin is VERY FRAGILE & TEARS EASILY. PLEASE use an alternative to tape.    Family History  Problem Relation Age of Onset  . Lung disease Father   . Lung disease Brother   . Alcohol abuse Brother   . Drug abuse Brother     Prior to Admission medications   Medication Sig Start Date End Date Taking? Authorizing Provider  acetaminophen (TYLENOL) 500 MG tablet Take 500 mg by mouth every 6 (six) hours as needed (for fever).   Yes [provider]  aspirin-sod bicarb-citric acid (ALKA-SELTZER) 325 MG TBEF tablet Take 325 mg by mouth every 6 (six) hours as needed (for indigestion).   Yes [provider]  ethambutol (MYAMBUTOL) 400 MG tablet Take 1,200 mg by mouth daily.   Yes [provider]  isoniazid (NYDRAZID) 300 MG tablet Take 300 mg by mouth daily.   Yes [provider]  pyrazinamide 500 MG tablet Take 1,500 mg by mouth daily.   Yes [provider]  pyridOXINE (B-6) 50 MG tablet Take 1 tablet (50 mg total) by mouth daily. 05/14/18 06/13/18 Yes Arrien, Jimmy Picket, MD  rifampin (RIFADIN) 300 MG capsule Take 600 mg by mouth daily.   Yes [provider]  Amino Acids-Protein Hydrolys (FEEDING SUPPLEMENT, PRO-STAT SUGAR FREE 64,) LIQD Take 30 mLs by mouth 2 (two) times daily. Patient not taking:  Reported on 05/23/2018 05/14/18   Arrien, Jimmy Picket, MD  losartan (COZAAR) 25 MG tablet Take 25 mg by mouth daily. 05/15/18   [provider]  metoprolol tartrate (LOPRESSOR) 25 MG tablet Take 0.5 tablets (12.5 mg total) by mouth 2 (two) times daily. 05/14/18 06/13/18  Tawni Millers, MD    Physical Exam: Vitals:   05/23/18 1900 05/23/18 1915 05/23/18 1930 05/23/18 1945  BP: 106/70 105/62 (!) 88/69 105/81  Pulse: (!) 107 (!) 102 95 (!) 105  Resp: (!) 25 (!) 23 (!) 28 (!) 25  Temp:      TempSrc:       SpO2: 91% 93% 92% 92%      Constitutional: Moderately built and nourished. Vitals:   05/23/18 1900 05/23/18 1915 05/23/18 1930 05/23/18 1945  BP: 106/70 105/62 (!) 88/69 105/81  Pulse: (!) 107 (!) 102 95 (!) 105  Resp: (!) 25 (!) 23 (!) 28 (!) 25  Temp:      TempSrc:      SpO2: 91% 93% 92% 92%   Eyes: Anicteric no pallor. ENMT: No discharge from the ears eyes nose or mouth. Neck: No mass felt.  No JVD appreciated. Respiratory: No rhonchi or crepitations. Cardiovascular: S1-S2 heard no murmurs appreciated. Abdomen: Soft nontender bowel sounds present. Musculoskeletal: No edema.  No joint effusion. Skin: No rash. Neurologic: Patient is alert awake but appears weak.  Moves all extremities. Psychiatric: Appears   Labs on Admission: I have personally reviewed following labs and imaging studies  CBC: Recent Labs  Lab 05/23/18 1435 05/23/18 1629 05/23/18 1721  WBC 19.7*  --   --   NEUTROABS 17.4*  --   --   HGB 10.6* 10.5* 11.2*  HCT 32.0* 31.0* 33.0*  MCV 77.5*  --   --   PLT 130*  --   --    Basic Metabolic Panel: Recent Labs  Lab 05/23/18 1435 05/23/18 1452 05/23/18 1629 05/23/18 1656 05/23/18 1721  NA 140  --  139 139 139  K 2.8*  --  3.2* 3.1* 2.8*  CL 113*  --  111 109 104  CO2 17*  --   --  20*  --   GLUCOSE 55*  --  47* 59* 59*  BUN 8  --  9 9 10   CREATININE 0.92  --  0.60* 1.07 0.80  CALCIUM 6.1*  --   --  7.0*  --   MG  --  1.4*  --   --   --    GFR: Estimated Creatinine Clearance: 88.3 mL/min (by C-G formula based on SCr of 0.8 mg/dL). Liver Function Tests: Recent Labs  Lab 05/23/18 1435  AST 108*  ALT 33  ALKPHOS 458*  BILITOT 1.1  PROT 3.6*  ALBUMIN 1.3*   Recent Labs  Lab 05/23/18 1452  LIPASE 23   No results for input(s): AMMONIA in the last 168 hours. Coagulation Profile: Recent Labs  Lab 05/23/18 1435  INR 1.73   Cardiac Enzymes: No results for input(s): CKTOTAL, CKMB, CKMBINDEX, TROPONINI in the last 168 hours. BNP  (last 3 results) No results for input(s): PROBNP in the last 8760 hours. HbA1C: No results for input(s): HGBA1C in the last 72 hours. CBG: Recent Labs  Lab 05/23/18 1956  GLUCAP 103*   Lipid Profile: No results for input(s): CHOL, HDL, LDLCALC, TRIG, CHOLHDL, LDLDIRECT in the last 72 hours. Thyroid Function Tests: No results for input(s): TSH, T4TOTAL, FREET4, T3FREE, THYROIDAB in the last 72 hours. Anemia Panel:  No results for input(s): VITAMINB12, FOLATE, FERRITIN, TIBC, IRON, RETICCTPCT in the last 72 hours. Urine analysis:    Component Value Date/Time   COLORURINE AMBER (A) 05/02/2018 1631   APPEARANCEUR HAZY (A) 05/02/2018 1631   LABSPEC 1.020 05/02/2018 1631   PHURINE 5.0 05/02/2018 1631   GLUCOSEU NEGATIVE 05/02/2018 1631   HGBUR NEGATIVE 05/02/2018 1631   BILIRUBINUR NEGATIVE 05/02/2018 1631   KETONESUR NEGATIVE 05/02/2018 1631   PROTEINUR 30 (A) 05/02/2018 1631   UROBILINOGEN 0.2 02/04/2010 1718   NITRITE NEGATIVE 05/02/2018 1631   LEUKOCYTESUR NEGATIVE 05/02/2018 1631   Sepsis Labs: @LABRCNTIP (procalcitonin:4,lacticidven:4) ) Recent Results (from the past 240 hour(s))  Culture, blood (Routine x 2)     Status: None (Preliminary result)   Collection Time: 05/23/18  3:43 PM  Result Value Ref Range Status   Specimen Description BLOOD BLOOD LEFT FOREARM  Final   Special Requests   Final    BOTTLES DRAWN AEROBIC ONLY Blood Culture results may not be optimal due to an inadequate volume of blood received in culture bottles   Culture PENDING  Incomplete   Report Status PENDING  Incomplete     Radiological Exams on Admission: Ct Abdomen Pelvis Wo Contrast  Result Date: 05/23/2018 CLINICAL DATA:  Tuberculosis.  Worsening cough and fever. EXAM: CT CHEST, ABDOMEN AND PELVIS WITHOUT CONTRAST TECHNIQUE: Multidetector CT imaging of the chest, abdomen and pelvis was performed following the standard protocol without IV contrast. COMPARISON:  Chest radiograph 05/23/2018. Chest  CTA 05/02/2018. CT abdomen and pelvis 05/05/2018. CONTRAST EXTRAVASATION CONSULTATION: Type of contrast:  Isovue 300 Site of extravasation: Left antecubital fossa Estimated volume of extravasation: 100 ml Area of extravasation scanned with CT? no PATIENT'S SIGNS AND SYMPTOMS Skin blistering/ulceration: no Decrease capillary refill: no Change in skin color: no Decreased motor function or severe tightness: no Decreased pulses distal to site of extravasation: no Altered sensation: no Increasing pain or signs of increased swelling during observation: no TREATMENT Observation period at site: Patient returned to the emergency department for continued observation. Orders placed for limb elevation and application of ice pack. Plastic surgery consulted? no DOCUMENTATION AND FOLLOW-UP Post extravasation orders completed? yes Patient's questions answered? yes Patient instructed to call [phone] or seek immediate medical care for new/progressive symptoms. FINDINGS: CT CHEST FINDINGS Cardiovascular: Thoracic aortic atherosclerosis without aneurysm. Normal heart size. Scattered coronary artery calcification. No significant pericardial effusion. Mediastinum/Nodes: AP window lymph nodes measure up to 12 mm in short axis, similar to the prior CT. Similar 1.4 cm short axis right hilar lymph node. Mild motion and streak artifact through the subcarinal region limits evaluation of the previously described lymph node in this location. Unremarkable thyroid. Possible mild esophageal wall thickening. Lungs/Pleura: Small right pleural effusion, new from the prior CT. No pneumothorax. Innumerable nodules are again seen diffusely throughout both lungs, greatest in the upper lungs and less in the bases. Bilateral upper lobe airspace consolidation is greatest in the apices, overall stable to slightly progressed since the prior CT. Small areas of cavitation within the biapical consolidation have not significantly progressed. A small amount of  patchy subpleural consolidation anteriorly in the right middle lobe is new from the prior CT, and patchy right middle lobe consolidation laterally has increased. There is mild dependent atelectasis in the right lower lobe. Musculoskeletal: Postsurgical changes at the left shoulder. Mild thoracic disc degeneration. No suspicious osseous lesion. CT ABDOMEN PELVIS FINDINGS The study is mildly motion degraded. Hepatobiliary: No focal liver abnormality is seen. No gallstones, gallbladder wall thickening, or  biliary dilatation. Pancreas: Unremarkable. Spleen: Unremarkable. Adrenals/Urinary Tract: Unchanged right adrenal nodule. Unremarkable left adrenal gland. Unchanged 11 mm left lower pole renal hypodensity, likely a cyst. Areas of subtle hypoenhancement in both kidneys on the prior CT cannot be re-evaluated today due to lack of IV contrast. There is no hydronephrosis. Unremarkable bladder. Stomach/Bowel: The stomach is largely collapsed. Scattered fluid is present throughout the colon, and there is also a moderate-sized air-fluid level in the rectum. Fluid is also noted in nondilated small bowel loops. There is no evidence of bowel obstruction. The appendix is grossly unremarkable. Vascular/Lymphatic: Mild abdominal aortic atherosclerosis without aneurysm. No enlarged lymph nodes. Reproductive: Unremarkable prostate. Other: Trace ascites, decreased from prior. No loculated fluid collection. Persistent diffusely edematous appearance of the intra-abdominal fat. Unchanged 1.5 cm calcified mesenteric nodule in the left mid abdomen (series 5, image 76). Musculoskeletal: No suspicious osseous lesion. Lower lumbar disc degeneration including severe disc space narrowing and spurring at L4-5 and L5-S1 resulting in moderate to severe bilateral neural foraminal stenosis. IMPRESSION: 1. Diffuse lung nodules and apical predominant lung consolidation, overall stable to mildly progressed from 05/02/2018 and consistent with the  patient's diagnosis of tuberculosis. 2. Small right pleural effusion, new from 05/02/2018. 3. Unchanged mild mediastinal and right hilar lymphadenopathy. 4. Scattered air-fluid levels in the colon and rectum suggesting a diarrheal illness. No bowel obstruction. 5. Trace ascites, decreased from prior. 6. Unchanged calcified mesenteric nodule. 7.  Aortic Atherosclerosis (ICD10-I70.0). 8. Contrast extravasation as documented above. Electronically Signed   By: Logan Bores M.D.   On: 05/23/2018 18:12   Ct Chest Wo Contrast  Result Date: 05/23/2018 CLINICAL DATA:  Tuberculosis.  Worsening cough and fever. EXAM: CT CHEST, ABDOMEN AND PELVIS WITHOUT CONTRAST TECHNIQUE: Multidetector CT imaging of the chest, abdomen and pelvis was performed following the standard protocol without IV contrast. COMPARISON:  Chest radiograph 05/23/2018. Chest CTA 05/02/2018. CT abdomen and pelvis 05/05/2018. CONTRAST EXTRAVASATION CONSULTATION: Type of contrast:  Isovue 300 Site of extravasation: Left antecubital fossa Estimated volume of extravasation: 100 ml Area of extravasation scanned with CT? no PATIENT'S SIGNS AND SYMPTOMS Skin blistering/ulceration: no Decrease capillary refill: no Change in skin color: no Decreased motor function or severe tightness: no Decreased pulses distal to site of extravasation: no Altered sensation: no Increasing pain or signs of increased swelling during observation: no TREATMENT Observation period at site: Patient returned to the emergency department for continued observation. Orders placed for limb elevation and application of ice pack. Plastic surgery consulted? no DOCUMENTATION AND FOLLOW-UP Post extravasation orders completed? yes Patient's questions answered? yes Patient instructed to call [phone] or seek immediate medical care for new/progressive symptoms. FINDINGS: CT CHEST FINDINGS Cardiovascular: Thoracic aortic atherosclerosis without aneurysm. Normal heart size. Scattered coronary artery  calcification. No significant pericardial effusion. Mediastinum/Nodes: AP window lymph nodes measure up to 12 mm in short axis, similar to the prior CT. Similar 1.4 cm short axis right hilar lymph node. Mild motion and streak artifact through the subcarinal region limits evaluation of the previously described lymph node in this location. Unremarkable thyroid. Possible mild esophageal wall thickening. Lungs/Pleura: Small right pleural effusion, new from the prior CT. No pneumothorax. Innumerable nodules are again seen diffusely throughout both lungs, greatest in the upper lungs and less in the bases. Bilateral upper lobe airspace consolidation is greatest in the apices, overall stable to slightly progressed since the prior CT. Small areas of cavitation within the biapical consolidation have not significantly progressed. A small amount of patchy subpleural consolidation anteriorly in  the right middle lobe is new from the prior CT, and patchy right middle lobe consolidation laterally has increased. There is mild dependent atelectasis in the right lower lobe. Musculoskeletal: Postsurgical changes at the left shoulder. Mild thoracic disc degeneration. No suspicious osseous lesion. CT ABDOMEN PELVIS FINDINGS The study is mildly motion degraded. Hepatobiliary: No focal liver abnormality is seen. No gallstones, gallbladder wall thickening, or biliary dilatation. Pancreas: Unremarkable. Spleen: Unremarkable. Adrenals/Urinary Tract: Unchanged right adrenal nodule. Unremarkable left adrenal gland. Unchanged 11 mm left lower pole renal hypodensity, likely a cyst. Areas of subtle hypoenhancement in both kidneys on the prior CT cannot be re-evaluated today due to lack of IV contrast. There is no hydronephrosis. Unremarkable bladder. Stomach/Bowel: The stomach is largely collapsed. Scattered fluid is present throughout the colon, and there is also a moderate-sized air-fluid level in the rectum. Fluid is also noted in nondilated  small bowel loops. There is no evidence of bowel obstruction. The appendix is grossly unremarkable. Vascular/Lymphatic: Mild abdominal aortic atherosclerosis without aneurysm. No enlarged lymph nodes. Reproductive: Unremarkable prostate. Other: Trace ascites, decreased from prior. No loculated fluid collection. Persistent diffusely edematous appearance of the intra-abdominal fat. Unchanged 1.5 cm calcified mesenteric nodule in the left mid abdomen (series 5, image 76). Musculoskeletal: No suspicious osseous lesion. Lower lumbar disc degeneration including severe disc space narrowing and spurring at L4-5 and L5-S1 resulting in moderate to severe bilateral neural foraminal stenosis. IMPRESSION: 1. Diffuse lung nodules and apical predominant lung consolidation, overall stable to mildly progressed from 05/02/2018 and consistent with the patient's diagnosis of tuberculosis. 2. Small right pleural effusion, new from 05/02/2018. 3. Unchanged mild mediastinal and right hilar lymphadenopathy. 4. Scattered air-fluid levels in the colon and rectum suggesting a diarrheal illness. No bowel obstruction. 5. Trace ascites, decreased from prior. 6. Unchanged calcified mesenteric nodule. 7.  Aortic Atherosclerosis (ICD10-I70.0). 8. Contrast extravasation as documented above. Electronically Signed   By: Logan Bores M.D.   On: 05/23/2018 18:12   Dg Chest Port 1 View  Result Date: 05/23/2018 CLINICAL DATA:  Shortness of breath and fever. EXAM: PORTABLE CHEST 1 VIEW COMPARISON:  05/10/2018 and prior chest radiographs FINDINGS: Cardiomediastinal silhouette is unchanged. Bilateral airspace opacities, greatest in the UPPER lungs, have not significantly changed. No pleural effusion or pneumothorax. No acute bony abnormalities identified. IMPRESSION: Unchanged bilateral airspace opacities, UPPER lobe predominance. Electronically Signed   By: Margarette Canada M.D.   On: 05/23/2018 15:55    EKG: Independently reviewed.  Sinus  tachycardia.  Assessment/Plan Principal Problem:   Sepsis ([place]) Active Problems:   Diabetes mellitus without complication ([place])   Sarcoidosis   Hypertension   Thrombocytopenia ([facility])   Hyponatremia   Pulmonary tuberculosis   Diarrhea    1. Possible sepsis -in addition to patient's antituberculosis medication patient has been placed on Zosyn.  There is new pleural effusion.  Will check BNP and 2D echo.  Continue with IV fluids for now follow blood cultures lactate stool studies particularly C. difficile since patient has diarrhea. 2. Hypokalemia hypomagnesemia and hyperglycemia likely from nausea vomiting and diarrhea.  Replace and recheck.  Closely follow CBGs. 3. Diarrhea check stool for C. Difficile. 4. History of diabetes mellitus type 2 presently hypoglycemic closely follow CBGs.  Last hemoglobin A1c last week was around 11. 5. Thrombocytopenia -appears to be chronic follow CBC.   6. History of sarcoidosis presently not on medication. 7. Severe protein calorie malnutrition. 8. History of hypertension presently blood pressures in the low normal.  Will hold off Cozaar  and metoprolol.  Closely observe.   DVT prophylaxis: Lovenox. Code Status: Full code. Family Communication: No family at the bedside. Disposition Plan: Home. Consults called: Infectious disease. Admission status: Inpatient.   Rise Patience MD [facility] Pager [phone].  If 7PM-7AM, please contact night-coverage [email or web address] Password [email or web address]  05/23/2018, 9:05 PM

## 2018-05-24 ENCOUNTER — Inpatient Hospital Stay (HOSPITAL_COMMUNITY): Payer: Medicare Other

## 2018-05-24 ENCOUNTER — Other Ambulatory Visit: Payer: Self-pay

## 2018-05-24 DIAGNOSIS — I10 Essential (primary) hypertension: Secondary | ICD-10-CM

## 2018-05-24 DIAGNOSIS — J189 Pneumonia, unspecified organism: Secondary | ICD-10-CM

## 2018-05-24 DIAGNOSIS — L8990 Pressure ulcer of unspecified site, unspecified stage: Secondary | ICD-10-CM

## 2018-05-24 LAB — CBC WITH DIFFERENTIAL/PLATELET
BASOS PCT: 1 %
Basophils Absolute: 0.1 10*3/uL (ref 0.0–0.1)
EOS ABS: 0 10*3/uL (ref 0.0–0.7)
Eosinophils Relative: 0 %
HEMATOCRIT: 35.7 % — AB (ref 39.0–52.0)
Hemoglobin: 11.9 g/dL — ABNORMAL LOW (ref 13.0–17.0)
LYMPHS PCT: 4 %
Lymphs Abs: 0.5 10*3/uL — ABNORMAL LOW (ref 0.7–4.0)
MCH: 25.8 pg — ABNORMAL LOW (ref 26.0–34.0)
MCHC: 33.3 g/dL (ref 30.0–36.0)
MCV: 77.4 fL — ABNORMAL LOW (ref 78.0–100.0)
MONO ABS: 0.1 10*3/uL (ref 0.1–1.0)
Monocytes Relative: 1 %
NEUTROS ABS: 10.8 10*3/uL — AB (ref 1.7–7.7)
Neutrophils Relative %: 94 %
Platelets: 139 10*3/uL — ABNORMAL LOW (ref 150–400)
RBC: 4.61 MIL/uL (ref 4.22–5.81)
RDW: 16.9 % — AB (ref 11.5–15.5)
WBC: 11.5 10*3/uL — ABNORMAL HIGH (ref 4.0–10.5)

## 2018-05-24 LAB — CBC
HEMATOCRIT: 36.7 % — AB (ref 39.0–52.0)
Hemoglobin: 11.7 g/dL — ABNORMAL LOW (ref 13.0–17.0)
MCH: 25.9 pg — AB (ref 26.0–34.0)
MCHC: 31.9 g/dL (ref 30.0–36.0)
MCV: 81.2 fL (ref 78.0–100.0)
Platelets: 118 10*3/uL — ABNORMAL LOW (ref 150–400)
RBC: 4.52 MIL/uL (ref 4.22–5.81)
RDW: 17.4 % — AB (ref 11.5–15.5)
WBC: 12.1 10*3/uL — ABNORMAL HIGH (ref 4.0–10.5)

## 2018-05-24 LAB — HEPATIC FUNCTION PANEL
ALK PHOS: 515 U/L — AB (ref 38–126)
ALT: 40 U/L (ref 17–63)
AST: 115 U/L — AB (ref 15–41)
Albumin: 1.6 g/dL — ABNORMAL LOW (ref 3.5–5.0)
BILIRUBIN DIRECT: 0.6 mg/dL — AB (ref 0.1–0.5)
BILIRUBIN TOTAL: 1.2 mg/dL (ref 0.3–1.2)
Indirect Bilirubin: 0.6 mg/dL (ref 0.3–0.9)
Total Protein: 4.6 g/dL — ABNORMAL LOW (ref 6.5–8.1)

## 2018-05-24 LAB — BASIC METABOLIC PANEL
Anion gap: 8 (ref 5–15)
BUN: 7 mg/dL (ref 6–20)
CHLORIDE: 111 mmol/L (ref 101–111)
CO2: 19 mmol/L — ABNORMAL LOW (ref 22–32)
Calcium: 7.2 mg/dL — ABNORMAL LOW (ref 8.9–10.3)
Creatinine, Ser: 0.97 mg/dL (ref 0.61–1.24)
GFR calc Af Amer: >60 mL/min (ref >60)
GFR calc non Af Amer: >60 mL/min (ref >60)
GLUCOSE: 75 mg/dL (ref 65–99)
Potassium: 3.7 mmol/L (ref 3.5–5.1)
Sodium: 138 mmol/L (ref 135–145)

## 2018-05-24 LAB — CREATININE, SERUM
Creatinine, Ser: 0.87 mg/dL (ref 0.61–1.24)
GFR calc Af Amer: >60 mL/min (ref >60)
GFR calc non Af Amer: >60 mL/min (ref >60)

## 2018-05-24 LAB — GLUCOSE, CAPILLARY
GLUCOSE-CAPILLARY: 69 mg/dL (ref 65–99)
GLUCOSE-CAPILLARY: 84 mg/dL (ref 65–99)
GLUCOSE-CAPILLARY: 96 mg/dL (ref 65–99)
GLUCOSE-CAPILLARY: 73 mg/dL (ref 65–99)
Glucose-Capillary: 57 mg/dL — ABNORMAL LOW (ref 65–99)
Glucose-Capillary: 73 mg/dL (ref 65–99)
Glucose-Capillary: 135 mg/dL — ABNORMAL HIGH (ref 65–99)
Glucose-Capillary: 63 mg/dL — ABNORMAL LOW (ref 65–99)

## 2018-05-24 LAB — LACTIC ACID, PLASMA
LACTIC ACID, VENOUS: 2.2 mmol/L — AB (ref 0.5–1.9)
Lactic Acid, Venous: 1.8 mmol/L (ref 0.5–1.9)
Lactic Acid, Venous: 2.1 mmol/L (ref 0.5–1.9)

## 2018-05-24 LAB — BRAIN NATRIURETIC PEPTIDE: B NATRIURETIC PEPTIDE 5: 95.3 pg/mL (ref 0.0–100.0)

## 2018-05-24 LAB — URINALYSIS, ROUTINE W REFLEX MICROSCOPIC
BILIRUBIN URINE: NEGATIVE
GLUCOSE, UA: NEGATIVE mg/dL
HGB URINE DIPSTICK: NEGATIVE
Ketones, ur: NEGATIVE mg/dL
Leukocytes, UA: NEGATIVE
Nitrite: NEGATIVE
PROTEIN: NEGATIVE mg/dL
Specific Gravity, Urine: 1.035 — ABNORMAL HIGH (ref 1.005–1.030)
pH: 5 (ref 5.0–8.0)

## 2018-05-24 LAB — TROPONIN I
TROPONIN I: 0.05 ng/mL — AB (ref <0.03)
Troponin I: 0.03 ng/mL (ref <0.03)
Troponin I: 0.18 ng/mL (ref <0.03)

## 2018-05-24 LAB — C DIFFICILE QUICK SCREEN W PCR REFLEX
C DIFFICILE (CDIFF) TOXIN: NEGATIVE
C Diff antigen: NEGATIVE
C Diff interpretation: NOT DETECTED

## 2018-05-24 LAB — MAGNESIUM: Magnesium: 1.4 mg/dL — ABNORMAL LOW (ref 1.7–2.4)

## 2018-05-24 MED ORDER — DEXTROSE 50 % IV SOLN
INTRAVENOUS | Status: AC
  Administered 2018-05-24: 25 mL
  Filled 2018-05-24: qty 50

## 2018-05-24 MED ORDER — PROMETHAZINE HCL 25 MG/ML IJ SOLN
12.5000 mg | Freq: Four times a day (QID) | INTRAMUSCULAR | Status: DC | PRN
  Filled 2018-05-24: qty 1

## 2018-05-24 MED ORDER — ADULT MULTIVITAMIN W/MINERALS CH
1.0000 | ORAL_TABLET | Freq: Every day | ORAL | Status: DC
  Filled 2018-05-24: qty 1

## 2018-05-24 MED ORDER — LOPERAMIDE HCL 2 MG PO CAPS
2.0000 mg | ORAL_CAPSULE | Freq: Three times a day (TID) | ORAL | Status: DC | PRN
  Administered 2018-05-24 – 2018-05-25 (×2): 2 mg via ORAL
  Filled 2018-05-24 (×3): qty 1

## 2018-05-24 MED ORDER — MAGNESIUM SULFATE 2 GM/50ML IV SOLN
2.0000 g | Freq: Once | INTRAVENOUS | Status: AC
  Administered 2018-05-24: 2 g via INTRAVENOUS
  Filled 2018-05-24: qty 50

## 2018-05-24 MED ORDER — LOPERAMIDE HCL 2 MG PO CAPS
4.0000 mg | ORAL_CAPSULE | Freq: Once | ORAL | Status: AC
  Administered 2018-05-24: 4 mg via ORAL
  Filled 2018-05-24: qty 2

## 2018-05-24 MED ORDER — ADULT MULTIVITAMIN W/MINERALS CH
1.0000 | ORAL_TABLET | Freq: Every day | ORAL | Status: DC
  Administered 2018-05-24 – 2018-05-25 (×2): 1 via ORAL
  Filled 2018-05-24: qty 1

## 2018-05-24 MED ORDER — DEXTROSE 50 % IV SOLN
INTRAVENOUS | Status: AC
  Administered 2018-05-24: 50 mL
  Filled 2018-05-24: qty 50

## 2018-05-24 MED ORDER — ONDANSETRON HCL 4 MG/2ML IJ SOLN
4.0000 mg | Freq: Three times a day (TID) | INTRAMUSCULAR | Status: DC
  Administered 2018-05-24 – 2018-05-25 (×2): 4 mg via INTRAVENOUS
  Filled 2018-05-24 (×2): qty 2

## 2018-05-24 MED ORDER — SODIUM CHLORIDE 0.9 % IV SOLN
INTRAVENOUS | Status: DC
  Administered 2018-05-24 – 2018-05-31 (×3): via INTRAVENOUS

## 2018-05-24 MED ORDER — SODIUM CHLORIDE 0.9 % IV BOLUS
500.0000 mL | Freq: Once | INTRAVENOUS | Status: AC
  Administered 2018-05-24: 500 mL via INTRAVENOUS

## 2018-05-24 MED ORDER — BOOST / RESOURCE BREEZE PO LIQD CUSTOM
1.0000 | Freq: Three times a day (TID) | ORAL | Status: DC
  Administered 2018-05-24 – 2018-05-27 (×5): 1 via ORAL
  Administered 2018-05-28 (×2): via ORAL
  Administered 2018-05-29 – 2018-06-05 (×14): 1 via ORAL
  Filled 2018-05-24 (×41): qty 1

## 2018-05-24 NOTE — Progress Notes (Signed)
Inpatient Diabetes Program Recommendations  AACE/ADA: New Consensus Statement on Inpatient Glycemic Control (2015)  Target Ranges:  Prepandial:   less than 140 mg/dL      Peak postprandial:   less than 180 mg/dL (1-2 hours)      Critically ill patients:  140 - 180 mg/dL   Lab Results  Component Value Date   GLUCAP 84 05/24/2018   HGBA1C 11.8 (H) 05/12/2018    Review of Glycemic Control Results for JAKEIM, SEDORE (MRN [id number]) as of 05/24/2018 12:45  Ref. Range 05/24/2018 04:51 05/24/2018 05:21 05/24/2018 08:06 05/24/2018 11:53  Glucose-Capillary Latest Ref Range: 65 - 99 mg/dL 63 (L) 135 (H) 69 84    Outpatient Diabetes medications: none Current orders for Inpatient glycemic control: none  Inpatient Diabetes Program Recommendations:    Patient was recently discharged and oral antidiabetic medications (Amaryl and Metformin) were discontinued. Noted A1C of 11.8%. Question validity of this lab value. At time of lab draw, patient received blood transfusion, thus potentially compromising result. Secondly, BS are not reflective of this A1C (this was also noted by DM team during previous admission 05/12/18).   Placed consult for dietitian with patient experiencing lows.   Patient needs close follow up with PCP regarding DM for reeval when appropriate of A1C. Will continue to follow.   Thanks, Bronson Curb, MSN, RNC-OB Diabetes Coordinator [phone] (8a-5p)

## 2018-05-24 NOTE — Consult Note (Signed)
[facility]  Total days of antibiotics 21        Day 21 RIPE               Reason for Consult: pneumonia in addition to disseminated TB    Referring Physician: Tana Coast  Principal Problem:   Sepsis ([place]) Active Problems:   Diabetes mellitus without complication ([place])   Sarcoidosis   Hypertension   Thrombocytopenia ([place])   Hyponatremia   Pulmonary tuberculosis   Diarrhea   Pressure injury of skin    HPI: Jeffrey Hayes is a 67 y.o. male with hx of sarcoidosis who had been on high dose steroids for 5 months when he was diagnosed with disseminated mTB( pulmonary, bone marrow, +/- GI). He was discharged on 6/10 and returns to the ED on 6/18 for N/V/ diarrhea. Feeling increasing weak with poor appetite. In the ED, he was found to be  Febrile, tachycardic with mildly elevated lactate level 2.2 and other electrolyte imbalances with hypokalemia hypomagnesemia.  CT chest abdomen pelvis was done which shows new pleural effusion.he was started on piptazo in addition to RIPE. Alb still low at 1.6. His ast 115 and alt 40. tibili 1.2 alp elevated at 515 - as previously known. Chest/abd CT unrevealing, except small new areas of patchy infiltrate, small right effusion on CT not appreciated on CXR in comparison to early June per my review.    Past Medical History:  Diagnosis Date  . Arthritis   . Diabetes mellitus without complication ([place])   . Hypertension   . Sarcoidosis     Allergies:  Allergies  Allergen Reactions  . Tape Other (See Comments)    Per the patient's family, his skin is VERY FRAGILE & TEARS EASILY. PLEASE use an alternative to tape.    MEDICATIONS: . enoxaparin (LOVENOX) injection  40 mg Subcutaneous Q24H  . ethambutol  1,200 mg Oral Daily  . feeding supplement  1 Container Oral TID BM  . isoniazid  300 mg Oral Daily  . multivitamin with minerals  1 tablet Oral Daily  . pyrazinamide  1,500 mg Oral Daily  . pyridOXINE  50 mg Oral Daily  .  rifampin  600 mg Oral Daily    Social History   Tobacco Use  . Smoking status: Former Smoker    Packs/day: 0.75    Years: 50.00    Pack years: 37.50    Types: Cigarettes    Last attempt to quit: 12/06/2016    Years since quitting: 1.4  . Smokeless tobacco: Never Used  Substance Use Topics  . Alcohol use: Not Currently  . Drug use: No    Family History  Problem Relation Age of Onset  . Lung disease Father   . Lung disease Brother   . Alcohol abuse Brother   . Drug abuse Brother     Review of Systems  Constitutional: positive for loss of appetite, fatigue, weight loss. But negative for for fever, chills, diaphoresis,  and unexpected weight change.  HENT: Negative for congestion, sore throat, rhinorrhea, sneezing, trouble swallowing and sinus pressure.  Eyes: Negative for photophobia and visual disturbance.  Respiratory: Negative for cough, chest tightness, shortness of breath, wheezing and stridor.  Cardiovascular: Negative for chest pain, palpitations and leg swelling.  Gastrointestinal: positive for nausea, vomiting, but not abdominal pain, diarrhea, constipation, blood in stool, abdominal distention and anal bleeding.  Genitourinary: Negative for dysuria, hematuria, flank pain and difficulty urinating.  Musculoskeletal: Negative for myalgias,  back pain, joint swelling, arthralgias and gait problem.  Skin: Negative for color change, pallor, rash and wound.  Neurological: Negative for dizziness, tremors, weakness and light-headedness.  Hematological: Negative for adenopathy. Does not bruise/bleed easily.  Psychiatric/Behavioral: Negative for behavioral problems, confusion, sleep disturbance, dysphoric mood, decreased concentration and agitation.     OBJECTIVE: Temp:  [97.8 F (36.6 C)-100.7 F (38.2 C)] 99.8 F (37.7 C) (06/19 1737) Pulse Rate:  [85-202] 116 (06/19 1737) Resp:  [15-34] 20 (06/19 1737) BP: (88-146)/(62-129) 90/70 (06/19 1737) SpO2:  [91 %-97 %] 92 %  (06/19 1737) Physical Exam  Constitutional: He is oriented to person, place, and time. He appears chronically ill and mal-nourished. No distress.  HENT:  Mouth/Throat: Oropharynx is clear and moist. No oropharyngeal exudate.  Cardiovascular: Normal rate, regular rhythm and normal heart sounds. Exam reveals no gallop and no friction rub.  No murmur heard.  Pulmonary/Chest: Effort normal and breath sounds normal. No respiratory distress. He has no wheezes.  Abdominal: Soft. Bowel sounds are normal. He exhibits no distension. There is no tenderness.  Lymphadenopathy:  He has no cervical adenopathy.  Ext: anasarca noted to upper extremities Neurological: He is alert and oriented to person, place, and time.  Skin: Skin is warm and dry. No rash noted. No erythema.  Psychiatric: He has a normal mood and affect. His behavior is normal.     LABS: Results for orders placed or performed during the hospital encounter of 05/23/18 (from the past 48 hour(s))  Comprehensive metabolic panel     Status: Abnormal   Collection Time: 05/23/18  2:35 PM  Result Value Ref Range   Sodium 140 135 - 145 mmol/L   Potassium 2.8 (L) 3.5 - 5.1 mmol/L   Chloride 113 (H) 101 - 111 mmol/L   CO2 17 (L) 22 - 32 mmol/L   Glucose, Bld 55 (L) 65 - 99 mg/dL   BUN 8 6 - 20 mg/dL   Creatinine, Ser 0.92 0.61 - 1.24 mg/dL   Calcium 6.1 (LL) 8.9 - 10.3 mg/dL    Comment: CRITICAL RESULT CALLED TO, READ BACK BY AND VERIFIED WITH: E.ATKINS,RN 1602 05/23/18 WEBBERJ    Total Protein 3.6 (L) 6.5 - 8.1 g/dL   Albumin 1.3 (L) 3.5 - 5.0 g/dL   AST 108 (H) 15 - 41 U/L   ALT 33 17 - 63 U/L   Alkaline Phosphatase 458 (H) 38 - 126 U/L   Total Bilirubin 1.1 0.3 - 1.2 mg/dL   GFR calc non Af Amer >60 >60 mL/min   GFR calc Af Amer >60 >60 mL/min    Comment: (NOTE) The eGFR has been calculated using the CKD EPI equation. This calculation has not been validated in all clinical situations. eGFR's persistently <60 mL/min signify  possible Chronic Kidney Disease.    Anion gap 10 5 - 15    Comment: Performed at [facility] [address] [place], [place] [address]  CBC with Differential     Status: Abnormal   Collection Time: 05/23/18  2:35 PM  Result Value Ref Range   WBC 19.7 (H) 4.0 - 10.5 K/uL    Comment: CORRECTED ON 06/18 AT 1521: PREVIOUSLY REPORTED AS 20.0   RBC 4.13 (L) 4.22 - 5.81 MIL/uL    Comment: CORRECTED ON 06/18 AT 1521: PREVIOUSLY REPORTED AS 4.18   Hemoglobin 10.6 (L) 13.0 - 17.0 g/dL    Comment: CORRECTED ON 06/18 AT 1521: PREVIOUSLY REPORTED AS 10.8   HCT 32.0 (L) 39.0 -  52.0 %    Comment: CORRECTED ON 06/18 AT 1521: PREVIOUSLY REPORTED AS 32.8   MCV 77.5 (L) 78.0 - 100.0 fL    Comment: CORRECTED ON 06/18 AT 1521: PREVIOUSLY REPORTED AS 78.5   MCH 25.7 (L) 26.0 - 34.0 pg    Comment: CORRECTED ON 06/18 AT 1521: PREVIOUSLY REPORTED AS 25.8   MCHC 33.1 30.0 - 36.0 g/dL    Comment: CORRECTED ON 06/18 AT 1521: PREVIOUSLY REPORTED AS 32.9   RDW 16.8 (H) 11.5 - 15.5 %    Comment: CORRECTED ON 06/18 AT 1521: PREVIOUSLY REPORTED AS 17.0   Platelets 130 (L) 150 - 400 K/uL    Comment: CORRECTED ON 06/18 AT 1521: PREVIOUSLY REPORTED AS 135   Neutrophils Relative % 88 %   Neutro Abs 17.4 (H) 1.7 - 7.7 K/uL    Comment: CORRECTED ON 06/18 AT 1521: PREVIOUSLY REPORTED AS 17.7   Lymphocytes Relative 4 %   Lymphs Abs 0.8 0.7 - 4.0 K/uL    Comment: CORRECTED ON 06/18 AT 1521: PREVIOUSLY REPORTED AS 0.7   Monocytes Relative 7 %   Monocytes Absolute 1.3 (H) 0.1 - 1.0 K/uL   Eosinophils Relative 0 %   Eosinophils Absolute 0.0 0.0 - 0.7 K/uL   Basophils Relative 0 %   Basophils Absolute 0.1 0.0 - 0.1 K/uL   Immature Granulocytes 1 %   Abs Immature Granulocytes 0.1 0.0 - 0.1 K/uL    Comment: Performed at [facility], [place] [address], [place], [place] [address] CORRECTED ON 06/18 AT 1521: PREVIOUSLY REPORTED AS 0.2   Protime-INR     Status: Abnormal   Collection Time: 05/23/18  2:35 PM    Result Value Ref Range   Prothrombin Time 20.1 (H) 11.4 - 15.2 seconds   INR 1.73     Comment: Performed at [facility], [place] [address] [place], [place] [address]  Lipase, blood     Status: None   Collection Time: 05/23/18  2:52 PM  Result Value Ref Range   Lipase 23 11 - 51 U/L    Comment: Performed at [facility], [place] [address] [place], [place] [address]  Magnesium     Status: Abnormal   Collection Time: 05/23/18  2:52 PM  Result Value Ref Range   Magnesium 1.4 (L) 1.7 - 2.4 mg/dL    Comment: Performed at [facility] [address] [place], [place] [address]  Culture, blood (Routine x 2)     Status: None (Preliminary result)   Collection Time: 05/23/18  2:53 PM  Result Value Ref Range   Specimen Description BLOOD RIGHT ANTECUBITAL    Special Requests      BOTTLES DRAWN AEROBIC AND ANAEROBIC Blood Culture adequate volume Performed at [facility] [address] [place], [place] [address]    Culture NO GROWTH < 24 HOURS    Report Status PENDING   I-Stat CG4 Lactic Acid, ED     Status: None   Collection Time: 05/23/18  3:00 PM  Result Value Ref Range   Lactic Acid, Venous 1.52 0.5 - 1.9 mmol/L  Culture, blood (Routine x 2)     Status: None (Preliminary result)   Collection Time: 05/23/18  3:43 PM  Result Value Ref Range   Specimen Description BLOOD BLOOD LEFT FOREARM    Special Requests      BOTTLES DRAWN AEROBIC ONLY Blood Culture results may not be optimal due to an inadequate volume of blood  received in culture bottles   Culture NO GROWTH < 24 HOURS    Report Status PENDING   I-stat troponin, ED     Status: None   Collection Time: 05/23/18  4:04 PM  Result Value Ref Range   Troponin i, poc 0.00 0.00 - 0.08 ng/mL   Comment 3            Comment: Due to the release kinetics of cTnI, a negative result within the first hours of the onset of symptoms does not rule out myocardial infarction with certainty. If myocardial infarction is still  suspected, repeat the test at appropriate intervals.   I-Stat CG4 Lactic Acid, ED     Status: Abnormal   Collection Time: 05/23/18  4:29 PM  Result Value Ref Range   Lactic Acid, Venous 1.97 (H) 0.5 - 1.9 mmol/L  I-stat chem 8, ed     Status: Abnormal   Collection Time: 05/23/18  4:29 PM  Result Value Ref Range   Sodium 139 135 - 145 mmol/L   Potassium 3.2 (L) 3.5 - 5.1 mmol/L   Chloride 111 101 - 111 mmol/L   BUN 9 6 - 20 mg/dL   Creatinine, Ser 0.60 (L) 0.61 - 1.24 mg/dL   Glucose, Bld 47 (L) 65 - 99 mg/dL   Calcium, Ion 0.78 (LL) 1.15 - 1.40 mmol/L   TCO2 17 (L) 22 - 32 mmol/L   Hemoglobin 10.5 (L) 13.0 - 17.0 g/dL   HCT 31.0 (L) 39.0 - 52.0 %   Comment NOTIFIED PHYSICIAN   Basic metabolic panel     Status: Abnormal   Collection Time: 05/23/18  4:56 PM  Result Value Ref Range   Sodium 139 135 - 145 mmol/L   Potassium 3.1 (L) 3.5 - 5.1 mmol/L   Chloride 109 101 - 111 mmol/L   CO2 20 (L) 22 - 32 mmol/L   Glucose, Bld 59 (L) 65 - 99 mg/dL   BUN 9 6 - 20 mg/dL   Creatinine, Ser 1.07 0.61 - 1.24 mg/dL   Calcium 7.0 (L) 8.9 - 10.3 mg/dL   GFR calc non Af Amer >60 >60 mL/min   GFR calc Af Amer >60 >60 mL/min    Comment: (NOTE) The eGFR has been calculated using the CKD EPI equation. This calculation has not been validated in all clinical situations. eGFR's persistently <60 mL/min signify possible Chronic Kidney Disease.    Anion gap 10 5 - 15    Comment: Performed at [facility] [address], [place], [place] [address]  I-stat chem 8, ed     Status: Abnormal   Collection Time: 05/23/18  5:21 PM  Result Value Ref Range   Sodium 139 135 - 145 mmol/L   Potassium 2.8 (L) 3.5 - 5.1 mmol/L   Chloride 104 101 - 111 mmol/L   BUN 10 6 - 20 mg/dL   Creatinine, Ser 0.80 0.61 - 1.24 mg/dL   Glucose, Bld 59 (L) 65 - 99 mg/dL   Calcium, Ion 1.02 (L) 1.15 - 1.40 mmol/L   TCO2 18 (L) 22 - 32 mmol/L   Hemoglobin 11.2 (L) 13.0 - 17.0 g/dL   HCT 33.0 (L) 39.0 - 52.0 %  CBG  monitoring, ED     Status: Abnormal   Collection Time: 05/23/18  7:56 PM  Result Value Ref Range   Glucose-Capillary 103 (H) 65 - 99 mg/dL  C difficile quick scan w PCR reflex     Status: None   Collection  Time: 05/23/18 11:00 PM  Result Value Ref Range   C Diff antigen NEGATIVE NEGATIVE   C Diff toxin NEGATIVE NEGATIVE   C Diff interpretation No C. difficile detected.     Comment: Performed at [facility], [place] [address] [place], [place] [address]  CBC     Status: Abnormal   Collection Time: 05/23/18 11:21 PM  Result Value Ref Range   WBC 12.1 (H) 4.0 - 10.5 K/uL   RBC 4.52 4.22 - 5.81 MIL/uL   Hemoglobin 11.7 (L) 13.0 - 17.0 g/dL   HCT 36.7 (L) 39.0 - 52.0 %   MCV 81.2 78.0 - 100.0 fL   MCH 25.9 (L) 26.0 - 34.0 pg   MCHC 31.9 30.0 - 36.0 g/dL   RDW 17.4 (H) 11.5 - 15.5 %   Platelets 118 (L) 150 - 400 K/uL    Comment: PLATELET COUNT CONFIRMED BY SMEAR Performed at [facility] [address], [place], [place] [address]   Creatinine, serum     Status: None   Collection Time: 05/23/18 11:21 PM  Result Value Ref Range   Creatinine, Ser 0.87 0.61 - 1.24 mg/dL   GFR calc non Af Amer >60 >60 mL/min   GFR calc Af Amer >60 >60 mL/min    Comment: (NOTE) The eGFR has been calculated using the CKD EPI equation. This calculation has not been validated in all clinical situations. eGFR's persistently <60 mL/min signify possible Chronic Kidney Disease. Performed at [facility], [place] [address], [place], [place] [address]   Magnesium     Status: Abnormal   Collection Time: 05/23/18 11:21 PM  Result Value Ref Range   Magnesium 1.4 (L) 1.7 - 2.4 mg/dL    Comment: Performed at [facility] [address], [place], [place] [address]  Lactic acid, plasma     Status: None   Collection Time: 05/24/18 12:09 AM  Result Value Ref Range   Lactic Acid, Venous 1.8 0.5 - 1.9 mmol/L    Comment: Performed at [facility] [address] [place], [place] [address]    Glucose, capillary     Status: Abnormal   Collection Time: 05/24/18  1:13 AM  Result Value Ref Range   Glucose-Capillary 57 (L) 65 - 99 mg/dL  Urinalysis, Routine w reflex microscopic     Status: Abnormal   Collection Time: 05/24/18  1:37 AM  Result Value Ref Range   Color, Urine YELLOW YELLOW   APPearance CLEAR CLEAR   Specific Gravity, Urine 1.035 (H) 1.005 - 1.030   pH 5.0 5.0 - 8.0   Glucose, UA NEGATIVE NEGATIVE mg/dL   Hgb urine dipstick NEGATIVE NEGATIVE   Bilirubin Urine NEGATIVE NEGATIVE   Ketones, ur NEGATIVE NEGATIVE mg/dL   Protein, ur NEGATIVE NEGATIVE mg/dL   Nitrite NEGATIVE NEGATIVE   Leukocytes, UA NEGATIVE NEGATIVE    Comment: Performed at [facility] [address], [place], [place] [address]  Glucose, capillary     Status: None   Collection Time: 05/24/18  2:14 AM  Result Value Ref Range   Glucose-Capillary 73 65 - 99 mg/dL  Basic metabolic panel     Status: Abnormal   Collection Time: 05/24/18  3:46 AM  Result Value Ref Range   Sodium 138 135 - 145 mmol/L   Potassium 3.7 3.5 - 5.1 mmol/L    Comment: NO VISIBLE HEMOLYSIS   Chloride 111 101 - 111 mmol/L   CO2 19 (L)  22 - 32 mmol/L   Glucose, Bld 75 65 - 99 mg/dL   BUN 7 6 - 20 mg/dL   Creatinine, Ser 0.97 0.61 - 1.24 mg/dL   Calcium 7.2 (L) 8.9 - 10.3 mg/dL   GFR calc non Af Amer >60 >60 mL/min   GFR calc Af Amer >60 >60 mL/min    Comment: (NOTE) The eGFR has been calculated using the CKD EPI equation. This calculation has not been validated in all clinical situations. eGFR's persistently <60 mL/min signify possible Chronic Kidney Disease.    Anion gap 8 5 - 15    Comment: Performed at [facility] [address], [place], [place] [address]  Hepatic function panel     Status: Abnormal   Collection Time: 05/24/18  3:46 AM  Result Value Ref Range   Total Protein 4.6 (L) 6.5 - 8.1 g/dL   Albumin 1.6 (L) 3.5 - 5.0 g/dL   AST 115 (H) 15 - 41 U/L   ALT 40 17 - 63 U/L   Alkaline  Phosphatase 515 (H) 38 - 126 U/L   Total Bilirubin 1.2 0.3 - 1.2 mg/dL   Bilirubin, Direct 0.6 (H) 0.1 - 0.5 mg/dL   Indirect Bilirubin 0.6 0.3 - 0.9 mg/dL    Comment: Performed at [facility] [address] [place], [place] [address]  CBC WITH DIFFERENTIAL     Status: Abnormal   Collection Time: 05/24/18  3:46 AM  Result Value Ref Range   WBC 11.5 (H) 4.0 - 10.5 K/uL   RBC 4.61 4.22 - 5.81 MIL/uL   Hemoglobin 11.9 (L) 13.0 - 17.0 g/dL   HCT 35.7 (L) 39.0 - 52.0 %   MCV 77.4 (L) 78.0 - 100.0 fL   MCH 25.8 (L) 26.0 - 34.0 pg   MCHC 33.3 30.0 - 36.0 g/dL   RDW 16.9 (H) 11.5 - 15.5 %   Platelets 139 (L) 150 - 400 K/uL   Neutrophils Relative % 94 %   Lymphocytes Relative 4 %   Monocytes Relative 1 %   Eosinophils Relative 0 %   Basophils Relative 1 %   Neutro Abs 10.8 (H) 1.7 - 7.7 K/uL   Lymphs Abs 0.5 (L) 0.7 - 4.0 K/uL   Monocytes Absolute 0.1 0.1 - 1.0 K/uL   Eosinophils Absolute 0.0 0.0 - 0.7 K/uL   Basophils Absolute 0.1 0.0 - 0.1 K/uL    Comment: Performed at [facility], [address], [place], [place] [address]  Lactic acid, plasma     Status: Abnormal   Collection Time: 05/24/18  3:46 AM  Result Value Ref Range   Lactic Acid, Venous 2.2 (HH) 0.5 - 1.9 mmol/L    Comment: CRITICAL RESULT CALLED TO, READ BACK BY AND VERIFIED WITHArtemio Aly [id number] [phone] [facility] Performed at [facility], [place] [address] [place], [place] [address]   Glucose, capillary     Status: Abnormal   Collection Time: 05/24/18  4:51 AM  Result Value Ref Range   Glucose-Capillary 63 (L) 65 - 99 mg/dL  Glucose, capillary     Status: Abnormal   Collection Time: 05/24/18  5:21 AM  Result Value Ref Range   Glucose-Capillary 135 (H) 65 - 99 mg/dL  Glucose, capillary     Status: None   Collection Time: 05/24/18  8:06 AM  Result Value Ref Range   Glucose-Capillary 69 65 - 99 mg/dL  Lactic acid, plasma     Status: Abnormal  Collection Time: 05/24/18  8:52 AM  Result Value Ref  Range   Lactic Acid, Venous 2.1 (HH) 0.5 - 1.9 mmol/L    Comment: CRITICAL RESULT CALLED TO, READ BACK BY AND VERIFIED WITH: J.NARAMDAS,RN @ 1008 05/24/18 [place] Performed at [facility], [place] [address], [place], [place] [address] CORRECTED ON 06/19 AT 1234: PREVIOUSLY REPORTED AS 2.1 CRITICAL RESULT CALLED TO, READ BACK BY AND VERIFIED WITH: J.NARAMDAS,RN @ 0300 05/24/17 WEBBERJ   Troponin I (q 6hr x 3)     Status: Abnormal   Collection Time: 05/24/18  8:52 AM  Result Value Ref Range   Troponin I 0.05 (HH) <0.03 ng/mL    Comment: CRITICAL RESULT CALLED TO, READ BACK BY AND VERIFIED WITH: J.NARAMDAS,RN @ 1017 05/24/18 [place] Performed at [facility], [place] [address], [place], [place] [address]   Brain natriuretic peptide     Status: None   Collection Time: 05/24/18  8:52 AM  Result Value Ref Range   B Natriuretic Peptide 95.3 0.0 - 100.0 pg/mL    Comment: Performed at [facility] [address], [place], [place] [address]  Troponin I     Status: Abnormal   Collection Time: 05/24/18 11:46 AM  Result Value Ref Range   Troponin I 0.03 (HH) <0.03 ng/mL    Comment: CRITICAL VALUE NOTED.  VALUE IS CONSISTENT WITH PREVIOUSLY REPORTED AND CALLED VALUE. Performed at [facility], [place] [address], [place], [place] [address]   Glucose, capillary     Status: None   Collection Time: 05/24/18 11:53 AM  Result Value Ref Range   Glucose-Capillary 84 65 - 99 mg/dL  Troponin I     Status: Abnormal   Collection Time: 05/24/18  3:28 PM  Result Value Ref Range   Troponin I 0.18 (HH) <0.03 ng/mL    Comment: CRITICAL VALUE NOTED.  VALUE IS CONSISTENT WITH PREVIOUSLY REPORTED AND CALLED VALUE. Performed at [facility], [place] [address] [place], [place] [address]   Glucose, capillary     Status: None   Collection Time: 05/24/18  5:23 PM  Result Value Ref Range   Glucose-Capillary 96 65 - 99 mg/dL    MICRO: 6/18 blood cx pending IMAGING: Ct  Abdomen Pelvis Wo Contrast  Result Date: 05/23/2018 CLINICAL DATA:  Tuberculosis.  Worsening cough and fever. EXAM: CT CHEST, ABDOMEN AND PELVIS WITHOUT CONTRAST TECHNIQUE: Multidetector CT imaging of the chest, abdomen and pelvis was performed following the standard protocol without IV contrast. COMPARISON:  Chest radiograph 05/23/2018. Chest CTA 05/02/2018. CT abdomen and pelvis 05/05/2018. CONTRAST EXTRAVASATION CONSULTATION: Type of contrast:  Isovue 300 Site of extravasation: Left antecubital fossa Estimated volume of extravasation: 100 ml Area of extravasation scanned with CT? no PATIENT'S SIGNS AND SYMPTOMS Skin blistering/ulceration: no Decrease capillary refill: no Change in skin color: no Decreased motor function or severe tightness: no Decreased pulses distal to site of extravasation: no Altered sensation: no Increasing pain or signs of increased swelling during observation: no TREATMENT Observation period at site: Patient returned to the emergency department for continued observation. Orders placed for limb elevation and application of ice pack. Plastic surgery consulted? no DOCUMENTATION AND FOLLOW-UP Post extravasation orders completed? yes Patient's questions answered? yes Patient instructed to call [phone] or seek immediate medical care for new/progressive symptoms. FINDINGS: CT CHEST FINDINGS Cardiovascular: Thoracic aortic atherosclerosis without aneurysm. Normal heart size. Scattered coronary artery calcification. No significant pericardial effusion. Mediastinum/Nodes: AP window lymph nodes measure  up to 12 mm in short axis, similar to the prior CT. Similar 1.4 cm short axis right hilar lymph node. Mild motion and streak artifact through the subcarinal region limits evaluation of the previously described lymph node in this location. Unremarkable thyroid. Possible mild esophageal wall thickening. Lungs/Pleura: Small right pleural effusion, new from the prior CT. No pneumothorax. Innumerable  nodules are again seen diffusely throughout both lungs, greatest in the upper lungs and less in the bases. Bilateral upper lobe airspace consolidation is greatest in the apices, overall stable to slightly progressed since the prior CT. Small areas of cavitation within the biapical consolidation have not significantly progressed. A small amount of patchy subpleural consolidation anteriorly in the right middle lobe is new from the prior CT, and patchy right middle lobe consolidation laterally has increased. There is mild dependent atelectasis in the right lower lobe. Musculoskeletal: Postsurgical changes at the left shoulder. Mild thoracic disc degeneration. No suspicious osseous lesion. CT ABDOMEN PELVIS FINDINGS The study is mildly motion degraded. Hepatobiliary: No focal liver abnormality is seen. No gallstones, gallbladder wall thickening, or biliary dilatation. Pancreas: Unremarkable. Spleen: Unremarkable. Adrenals/Urinary Tract: Unchanged right adrenal nodule. Unremarkable left adrenal gland. Unchanged 11 mm left lower pole renal hypodensity, likely a cyst. Areas of subtle hypoenhancement in both kidneys on the prior CT cannot be re-evaluated today due to lack of IV contrast. There is no hydronephrosis. Unremarkable bladder. Stomach/Bowel: The stomach is largely collapsed. Scattered fluid is present throughout the colon, and there is also a moderate-sized air-fluid level in the rectum. Fluid is also noted in nondilated small bowel loops. There is no evidence of bowel obstruction. The appendix is grossly unremarkable. Vascular/Lymphatic: Mild abdominal aortic atherosclerosis without aneurysm. No enlarged lymph nodes. Reproductive: Unremarkable prostate. Other: Trace ascites, decreased from prior. No loculated fluid collection. Persistent diffusely edematous appearance of the intra-abdominal fat. Unchanged 1.5 cm calcified mesenteric nodule in the left mid abdomen (series 5, image 76). Musculoskeletal: No  suspicious osseous lesion. Lower lumbar disc degeneration including severe disc space narrowing and spurring at L4-5 and L5-S1 resulting in moderate to severe bilateral neural foraminal stenosis. IMPRESSION: 1. Diffuse lung nodules and apical predominant lung consolidation, overall stable to mildly progressed from 05/02/2018 and consistent with the patient's diagnosis of tuberculosis. 2. Small right pleural effusion, new from 05/02/2018. 3. Unchanged mild mediastinal and right hilar lymphadenopathy. 4. Scattered air-fluid levels in the colon and rectum suggesting a diarrheal illness. No bowel obstruction. 5. Trace ascites, decreased from prior. 6. Unchanged calcified mesenteric nodule. 7.  Aortic Atherosclerosis (ICD10-I70.0). 8. Contrast extravasation as documented above. Electronically Signed   By: Logan Bores M.D.   On: 05/23/2018 18:12   Ct Chest Wo Contrast  Result Date: 05/23/2018 CLINICAL DATA:  Tuberculosis.  Worsening cough and fever. EXAM: CT CHEST, ABDOMEN AND PELVIS WITHOUT CONTRAST TECHNIQUE: Multidetector CT imaging of the chest, abdomen and pelvis was performed following the standard protocol without IV contrast. COMPARISON:  Chest radiograph 05/23/2018. Chest CTA 05/02/2018. CT abdomen and pelvis 05/05/2018. CONTRAST EXTRAVASATION CONSULTATION: Type of contrast:  Isovue 300 Site of extravasation: Left antecubital fossa Estimated volume of extravasation: 100 ml Area of extravasation scanned with CT? no PATIENT'S SIGNS AND SYMPTOMS Skin blistering/ulceration: no Decrease capillary refill: no Change in skin color: no Decreased motor function or severe tightness: no Decreased pulses distal to site of extravasation: no Altered sensation: no Increasing pain or signs of increased swelling during observation: no TREATMENT Observation period at site: Patient returned to the emergency department for continued  observation. Orders placed for limb elevation and application of ice pack. Plastic surgery  consulted? no DOCUMENTATION AND FOLLOW-UP Post extravasation orders completed? yes Patient's questions answered? yes Patient instructed to call [phone] or seek immediate medical care for new/progressive symptoms. FINDINGS: CT CHEST FINDINGS Cardiovascular: Thoracic aortic atherosclerosis without aneurysm. Normal heart size. Scattered coronary artery calcification. No significant pericardial effusion. Mediastinum/Nodes: AP window lymph nodes measure up to 12 mm in short axis, similar to the prior CT. Similar 1.4 cm short axis right hilar lymph node. Mild motion and streak artifact through the subcarinal region limits evaluation of the previously described lymph node in this location. Unremarkable thyroid. Possible mild esophageal wall thickening. Lungs/Pleura: Small right pleural effusion, new from the prior CT. No pneumothorax. Innumerable nodules are again seen diffusely throughout both lungs, greatest in the upper lungs and less in the bases. Bilateral upper lobe airspace consolidation is greatest in the apices, overall stable to slightly progressed since the prior CT. Small areas of cavitation within the biapical consolidation have not significantly progressed. A small amount of patchy subpleural consolidation anteriorly in the right middle lobe is new from the prior CT, and patchy right middle lobe consolidation laterally has increased. There is mild dependent atelectasis in the right lower lobe. Musculoskeletal: Postsurgical changes at the left shoulder. Mild thoracic disc degeneration. No suspicious osseous lesion. CT ABDOMEN PELVIS FINDINGS The study is mildly motion degraded. Hepatobiliary: No focal liver abnormality is seen. No gallstones, gallbladder wall thickening, or biliary dilatation. Pancreas: Unremarkable. Spleen: Unremarkable. Adrenals/Urinary Tract: Unchanged right adrenal nodule. Unremarkable left adrenal gland. Unchanged 11 mm left lower pole renal hypodensity, likely a cyst. Areas of  subtle hypoenhancement in both kidneys on the prior CT cannot be re-evaluated today due to lack of IV contrast. There is no hydronephrosis. Unremarkable bladder. Stomach/Bowel: The stomach is largely collapsed. Scattered fluid is present throughout the colon, and there is also a moderate-sized air-fluid level in the rectum. Fluid is also noted in nondilated small bowel loops. There is no evidence of bowel obstruction. The appendix is grossly unremarkable. Vascular/Lymphatic: Mild abdominal aortic atherosclerosis without aneurysm. No enlarged lymph nodes. Reproductive: Unremarkable prostate. Other: Trace ascites, decreased from prior. No loculated fluid collection. Persistent diffusely edematous appearance of the intra-abdominal fat. Unchanged 1.5 cm calcified mesenteric nodule in the left mid abdomen (series 5, image 76). Musculoskeletal: No suspicious osseous lesion. Lower lumbar disc degeneration including severe disc space narrowing and spurring at L4-5 and L5-S1 resulting in moderate to severe bilateral neural foraminal stenosis. IMPRESSION: 1. Diffuse lung nodules and apical predominant lung consolidation, overall stable to mildly progressed from 05/02/2018 and consistent with the patient's diagnosis of tuberculosis. 2. Small right pleural effusion, new from 05/02/2018. 3. Unchanged mild mediastinal and right hilar lymphadenopathy. 4. Scattered air-fluid levels in the colon and rectum suggesting a diarrheal illness. No bowel obstruction. 5. Trace ascites, decreased from prior. 6. Unchanged calcified mesenteric nodule. 7.  Aortic Atherosclerosis (ICD10-I70.0). 8. Contrast extravasation as documented above. Electronically Signed   By: Logan Bores M.D.   On: 05/23/2018 18:12   Dg Chest Port 1 View  Result Date: 05/23/2018 CLINICAL DATA:  Shortness of breath and fever. EXAM: PORTABLE CHEST 1 VIEW COMPARISON:  05/10/2018 and prior chest radiographs FINDINGS: Cardiomediastinal silhouette is unchanged. Bilateral  airspace opacities, greatest in the UPPER lungs, have not significantly changed. No pleural effusion or pneumothorax. No acute bony abnormalities identified. IMPRESSION: Unchanged bilateral airspace opacities, UPPER lobe predominance. Electronically Signed   By: Cleatis Polka.D.  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 Chest Ct findings still likely due to mTB. Does have leukocytosis which is new.   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 Would give antiemetic scheduled prior to his mTB treatment. Consider switch to after dinner time so that he is closer to going to be, and possibly less side effects  Pleural effusion = combination of what can be seen with mTB in addition to his malnutrition/hypoalbuminema  Severe protein-caloric malnutrition = recommend to have nutritionist evaluation of his dietary needs  Increased ALP = likely due to mTB based on BM aspirate findings from his previously hospitalization  transaminitis = continue to monitor. He is on rifampin which can cause transaminitis. Will continue watch.  Diarrhea = appears abtx associated since cdiff is negative

## 2018-05-24 NOTE — Progress Notes (Signed)
Gave 50 amp of dextrose to patient as pt was hypoglycemic and also throwing up, notified Dr. Tana Coast.  Unable to scan the medicine as it was taken from pyxis under 24M floor stock.

## 2018-05-24 NOTE — Progress Notes (Signed)
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  GOAL:   Patient will meet greater than or equal to 90% of their needs  MONITOR:   PO intake, Supplement acceptance, Labs, Weight trends  REASON FOR ASSESSMENT:   Malnutrition Screening Tool    ASSESSMENT:   67 yo male admitted with chest pain, fever and dairrhea with sepsis due to bilateral cavitary pneumonia with active TB, hx sarcoidosis. Pt with additional hx of HTN, DM  CT abdomen negative for acute issue Pt remains febrile, some nausea  Wife at bedside. Pt with poor appetite and intake. Recorded po intake 95% this AM. At home, pt eating only 1 meal per day; indicate a meal might be some chicken and peaches, maybe some oatmeal or cereal without the milk.. Pt with altered taste and sore throat and reporting aversions to many foods at present. Pt has not been drinking Ensure as it "runs right through him." Discussed alternative oral nutrition supplements to Ensure. Pt agreeable to trying [employer] and [employer]  Reports weight of 197 pounds in January; current wt 151 pounds.  23.4% wt loss in 5-6 months which is significant for time frame.   Pt with poor dentition; also reports very sore throat which is limiting his po intake. Pt indicates cold things such as ice cream are easier to  tolerate. Pt would likely benefit from mechanical soft diet at this time.   Noted periods of hypoglycemia. Recommend smaller, more frequent meals. Supplements between meals at present.   Labs: CBGs 63-135, corrected calcium 9.12, albumin 1.6 Meds: Ns with KCl at 100 ml/hr  NUTRITION - FOCUSED PHYSICAL EXAM:    Most Recent Value  Orbital Region  Moderate depletion  Upper Arm Region  Severe depletion [LUE edematous]  Thoracic and Lumbar Region  Severe depletion  Buccal Region  Moderate depletion  Temple Region  Severe depletion  Clavicle Bone Region  Severe depletion  Clavicle and Acromion Bone Region  Severe depletion  Scapular Bone Region  Severe depletion  Dorsal Hand  Mild depletion  Patellar Region  Moderate depletion  Anterior Thigh Region  Moderate depletion  Posterior Calf Region  Moderate depletion       Diet Order:  Heart Healthy  EDUCATION NEEDS:   Education needs have been addressed  Skin:  Skin Integrity Issues:: Stage II, Unstageable Stage II: sacrum Unstageable: penis Incisions: n/a  Last BM:  6/19  Height:   Ht Readings from Last 1 Encounters:  05/24/18 6\' 2"  (1.88 m)    Weight:   Wt Readings from Last 1 Encounters:  05/14/18 151 lb 7.3 oz (68.7 kg)    Ideal Body Weight:     BMI:  Body mass index is 19.45 kg/m.  Estimated Nutritional Needs:   Kcal:  2200-2400 kcals   Protein:  110-120 g  Fluid:  >/= 2.2 L   Kerman Passey MS, RD, LDN,  CNSC [phone] Pager  [phone] Weekend/On-Call Pager

## 2018-05-24 NOTE — Progress Notes (Addendum)
[facility]                                                                              Patient Demographics  Jeffrey Hayes, is a 67 y.o. male, DOB - August 24, 1951, BWI:[id number]  Admit date - 05/23/2018   Admitting Physician Rise Patience, MD  Outpatient Primary MD for the patient is [name], MD  Outpatient specialists:   LOS - 1  days   Medical records reviewed and are as summarized below:    Chief Complaint  Patient presents with  . Chest Pain  . Fever  . Diarrhea       Brief summary   Jeffrey Hayes is a 67 y.o. male with recently diagnosed pulmonary tuberculosis discharged home on antituberculosis medications presents to the ER with complaints of persistent nausea vomiting and diarrhea over the last few days.  Also has been having hiccups.  Has been feeling weak.  Has not been able to eat anything because of the vomiting.  Patient has been compliant with his anti-tuberculosis medications.  In ED, patient was febrile, tachycardia, lactic acidosis, hypokalemia, hypomagnesemia. CT chest abdomen and pelvis showed fused lung nodules and apical predominant lung consolidation, small right pleural effusion, scattered air-fluid levels in the colon and rectum suggesting viral illness, no bowel obstruction.      Assessment & Plan    Principal Problem:   Sepsis ([place]), bilateral cavitary pneumonia, active tuberculosis, in the setting of sarcoidosis -Follow stool studies, C. difficile negative -ID consulted, per Dr. Linus Salmons, continue anti-TB medications and started on IV Zosyn  Active Problems: Nausea, vomiting, diarrhea -Possibly due to medications, CT abdomen showed no obstruction or acute abdominal pathology -C. difficile negative -will place on IV fluid hydration  Transaminitis with obstructive pattern -Possibly due to active TB and medications Patient had-right upper quadrant ultrasound on 05/09/2018 which showed no gallstones however markedly  thickened gallbladder wall could be related to liver disease or cholecystitis. -CT abdomen pelvis on admission showed no gallstones, gallbladder wall thickening or biliary dilatation, no focal liver abnormality. 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, sarcoidosis:  -Platelets improving and underwent bone marrow biopsy during the previous admission which showed normocellular marrow with numerous granulomata  Type 2 diabetes mellitus -Continue sliding scale insulin -Hemoglobin A1c 11.8 -Amaryl and metformin were discontinued during the previous admission  Essential hypertension -BP currently stable  hypokalemia, hypomagnesemia -Continue potassium and magnesium replacement  Severe protein calorie malnutrition with hypoalbuminemia -Nutrition consult, albumin 1.6   Mildly elevated troponin -Likely due to #1, no chest pain, will obtain 2D echo for further work-up.   Code Status: Full CODE STATUS DVT Prophylaxis:  Lovenox Family Communication: Discussed in detail with the patient, all imaging results, lab results explained to the patient    Disposition Plan:   Time Spent in minutes  35 minutes  Procedures:    Consultants:   Infectious disease  Antimicrobials:      Medications  Scheduled Meds: . enoxaparin (LOVENOX) injection  40 mg Subcutaneous Q24H  . ethambutol  1,200 mg Oral Daily  . isoniazid  300 mg Oral Daily  . pyrazinamide  1,500 mg Oral Daily  . pyridOXINE  50 mg Oral Daily  . rifampin  600 mg Oral Daily   Continuous Infusions: . sodium chloride 10 mL/hr at 05/24/18 0046  . 0.9 % NaCl with KCl 20 mEq / L 100 mL/hr at 05/23/18 2343  . magnesium sulfate 1 - 4 g bolus IVPB    . piperacillin-tazobactam (ZOSYN)  IV 3.375 g (05/24/18 0846)   PRN Meds:.acetaminophen **OR** acetaminophen, ondansetron **OR** ondansetron (ZOFRAN) IV   Antibiotics   Anti-infectives (From admission, onward)   Start     Dose/Rate Route Frequency  Ordered Stop   05/24/18 1000  ethambutol (MYAMBUTOL) tablet 1,200 mg     1,200 mg Oral Daily 05/23/18 2104     05/24/18 1000  isoniazid (NYDRAZID) tablet 300 mg     300 mg Oral Daily 05/23/18 2104     05/24/18 1000  pyrazinamide tablet 1,500 mg     1,500 mg Oral Daily 05/23/18 2104     05/24/18 1000  rifampin (RIFADIN) capsule 600 mg     600 mg Oral Daily 05/23/18 2104     05/23/18 2330  piperacillin-tazobactam (ZOSYN) IVPB 3.375 g     3.375 g 12.5 mL/hr over 240 Minutes Intravenous Every 8 hours 05/23/18 1529     05/23/18 2330  vancomycin (VANCOCIN) IVPB 750 mg/150 ml premix  Status:  Discontinued     750 mg 150 mL/hr over 60 Minutes Intravenous Every 8 hours 05/23/18 1530 05/23/18 2104   05/23/18 1530  piperacillin-tazobactam (ZOSYN) IVPB 3.375 g     3.375 g 100 mL/hr over 30 Minutes Intravenous  Once 05/23/18 1515 05/23/18 1625   05/23/18 1530  vancomycin (VANCOCIN) IVPB 1000 mg/200 mL premix  Status:  Discontinued     1,000 mg 200 mL/hr over 60 Minutes Intravenous  Once 05/23/18 1515 05/23/18 1528   05/23/18 1530  vancomycin (VANCOCIN) 1,500 mg in sodium chloride 0.9 % 500 mL IVPB     1,500 mg 250 mL/hr over 120 Minutes Intravenous  Once 05/23/18 1528 05/23/18 1807        Subjective:   Loyce Klasen was seen and examined today.  Still spiking fevers 100.7 F, having nausea and one episode of vomiting. Patient denies dizziness, chest pain, shortness of breath,  new weakness, numbess, tingling.   Objective:   Vitals:   05/24/18 0303 05/24/18 0526 05/24/18 0600 05/24/18 0814  BP: 140/84 113/69  111/72  Pulse: (!) 202 (!) 129  (!) 125  Resp: 16 (!) 21  (!) 30  Temp: 97.8 F (36.6 C) (!) 100.5 F (38.1 C)  (!) 100.7 F (38.2 C)  TempSrc: Oral Oral  Oral  SpO2: 95% 95%  93%  Height:   6' 2"  (1.88 m)     Intake/Output Summary (Last 24 hours) at 05/24/2018 0932 Last data filed at 05/24/2018 0600 Gross per 24 hour  Intake 3326.1 ml  Output 500 ml  Net 2826.1 ml     Wt  Readings from Last 3 Encounters:  05/14/18 68.7 kg (151 lb 7.3 oz)  03/11/18 75.3 kg (166 lb)  01/25/18 84.8 kg (187 lb)     Exam  General: Alert and oriented x 3, NAD, frail and cachectic appearing  Eyes:   HEENT:  Atraumatic, normocephalic  Cardiovascular: S1 S2 auscultated, Regular rate and rhythm.  Respiratory: Decreased breath sounds at the bases otherwise no wheezing or rhonchi  Gastrointestinal: Soft, nontender, nondistended, + bowel sounds  Ext: no pedal edema bilaterally  Neuro: no new  deficit  Musculoskeletal: No digital cyanosis, clubbing  Skin: No rashes  Psych: Normal affect and demeanor, alert and oriented x3    Data Reviewed:  I have personally reviewed following labs and imaging studies  Micro Results Recent Results (from the past 240 hour(s))  Culture, blood (Routine x 2)     Status: None (Preliminary result)   Collection Time: 05/23/18  3:43 PM  Result Value Ref Range Status   Specimen Description BLOOD BLOOD LEFT FOREARM  Final   Special Requests   Final    BOTTLES DRAWN AEROBIC ONLY Blood Culture results may not be optimal due to an inadequate volume of blood received in culture bottles   Culture PENDING  Incomplete   Report Status PENDING  Incomplete  C difficile quick scan w PCR reflex     Status: None   Collection Time: 05/23/18 11:00 PM  Result Value Ref Range Status   C Diff antigen NEGATIVE NEGATIVE Final   C Diff toxin NEGATIVE NEGATIVE Final   C Diff interpretation No C. difficile detected.  Final    Comment: Performed at [facility], [place] [address] [place], [place] [address]    Radiology Reports Ct Abdomen Pelvis Wo Contrast  Result Date: 05/23/2018 CLINICAL DATA:  Tuberculosis.  Worsening cough and fever. EXAM: CT CHEST, ABDOMEN AND PELVIS WITHOUT CONTRAST TECHNIQUE: Multidetector CT imaging of the chest, abdomen and pelvis was performed following the standard protocol without IV contrast. COMPARISON:  Chest radiograph  05/23/2018. Chest CTA 05/02/2018. CT abdomen and pelvis 05/05/2018. CONTRAST EXTRAVASATION CONSULTATION: Type of contrast:  Isovue 300 Site of extravasation: Left antecubital fossa Estimated volume of extravasation: 100 ml Area of extravasation scanned with CT? no PATIENT'S SIGNS AND SYMPTOMS Skin blistering/ulceration: no Decrease capillary refill: no Change in skin color: no Decreased motor function or severe tightness: no Decreased pulses distal to site of extravasation: no Altered sensation: no Increasing pain or signs of increased swelling during observation: no TREATMENT Observation period at site: Patient returned to the emergency department for continued observation. Orders placed for limb elevation and application of ice pack. Plastic surgery consulted? no DOCUMENTATION AND FOLLOW-UP Post extravasation orders completed? yes Patient's questions answered? yes Patient instructed to call [phone] or seek immediate medical care for new/progressive symptoms. FINDINGS: CT CHEST FINDINGS Cardiovascular: Thoracic aortic atherosclerosis without aneurysm. Normal heart size. Scattered coronary artery calcification. No significant pericardial effusion. Mediastinum/Nodes: AP window lymph nodes measure up to 12 mm in short axis, similar to the prior CT. Similar 1.4 cm short axis right hilar lymph node. Mild motion and streak artifact through the subcarinal region limits evaluation of the previously described lymph node in this location. Unremarkable thyroid. Possible mild esophageal wall thickening. Lungs/Pleura: Small right pleural effusion, new from the prior CT. No pneumothorax. Innumerable nodules are again seen diffusely throughout both lungs, greatest in the upper lungs and less in the bases. Bilateral upper lobe airspace consolidation is greatest in the apices, overall stable to slightly progressed since the prior CT. Small areas of cavitation within the biapical consolidation have not significantly progressed.  A small amount of patchy subpleural consolidation anteriorly in the right middle lobe is new from the prior CT, and patchy right middle lobe consolidation laterally has increased. There is mild dependent atelectasis in the right lower lobe. Musculoskeletal: Postsurgical changes at the left shoulder. Mild thoracic disc degeneration. No suspicious osseous lesion. CT ABDOMEN PELVIS FINDINGS The study is mildly motion degraded. Hepatobiliary: No focal liver abnormality is seen. No gallstones, gallbladder  wall thickening, or biliary dilatation. Pancreas: Unremarkable. Spleen: Unremarkable. Adrenals/Urinary Tract: Unchanged right adrenal nodule. Unremarkable left adrenal gland. Unchanged 11 mm left lower pole renal hypodensity, likely a cyst. Areas of subtle hypoenhancement in both kidneys on the prior CT cannot be re-evaluated today due to lack of IV contrast. There is no hydronephrosis. Unremarkable bladder. Stomach/Bowel: The stomach is largely collapsed. Scattered fluid is present throughout the colon, and there is also a moderate-sized air-fluid level in the rectum. Fluid is also noted in nondilated small bowel loops. There is no evidence of bowel obstruction. The appendix is grossly unremarkable. Vascular/Lymphatic: Mild abdominal aortic atherosclerosis without aneurysm. No enlarged lymph nodes. Reproductive: Unremarkable prostate. Other: Trace ascites, decreased from prior. No loculated fluid collection. Persistent diffusely edematous appearance of the intra-abdominal fat. Unchanged 1.5 cm calcified mesenteric nodule in the left mid abdomen (series 5, image 76). Musculoskeletal: No suspicious osseous lesion. Lower lumbar disc degeneration including severe disc space narrowing and spurring at L4-5 and L5-S1 resulting in moderate to severe bilateral neural foraminal stenosis. IMPRESSION: 1. Diffuse lung nodules and apical predominant lung consolidation, overall stable to mildly progressed from 05/02/2018 and  consistent with the patient's diagnosis of tuberculosis. 2. Small right pleural effusion, new from 05/02/2018. 3. Unchanged mild mediastinal and right hilar lymphadenopathy. 4. Scattered air-fluid levels in the colon and rectum suggesting a diarrheal illness. No bowel obstruction. 5. Trace ascites, decreased from prior. 6. Unchanged calcified mesenteric nodule. 7.  Aortic Atherosclerosis (ICD10-I70.0). 8. Contrast extravasation as documented above. Electronically Signed   By: Logan Bores M.D.   On: 05/23/2018 18:12   Dg Chest 2 View  Result Date: 05/02/2018 CLINICAL DATA:  67 year old male with cirrhosis. Weakness and loss of appetite. EXAM: CHEST - 2 VIEW COMPARISON:  Chest CT 08/25/2017, radiographs 06/25/2016. FINDINGS: Semi upright AP and lateral views of the chest. Diffuse reticulonodular pulmonary opacity with an upper lobe predominance, and some semi confluent areas of upper lobe involvement. No superimposed pneumothorax or pleural effusion. Mediastinal contours remain normal. Visualized tracheal air column is within normal limits. Chronic postoperative changes to the left scapula. No acute osseous abnormality identified. Negative visible bowel gas pattern. IMPRESSION: Diffuse pulmonary reticulonodular opacity with some mildly confluent areas in the bilateral upper lobes. No pleural effusion. The appearance is nonspecific. Favor acute viral/atypical respiratory infection. Characterization with Chest CT (noncontrast probably would suffice) may be valuable. Electronically Signed   By: Genevie Ann M.D.   On: 05/02/2018 16:30   Ct Chest Wo Contrast  Result Date: 05/23/2018 CLINICAL DATA:  Tuberculosis.  Worsening cough and fever. EXAM: CT CHEST, ABDOMEN AND PELVIS WITHOUT CONTRAST TECHNIQUE: Multidetector CT imaging of the chest, abdomen and pelvis was performed following the standard protocol without IV contrast. COMPARISON:  Chest radiograph 05/23/2018. Chest CTA 05/02/2018. CT abdomen and pelvis  05/05/2018. CONTRAST EXTRAVASATION CONSULTATION: Type of contrast:  Isovue 300 Site of extravasation: Left antecubital fossa Estimated volume of extravasation: 100 ml Area of extravasation scanned with CT? no PATIENT'S SIGNS AND SYMPTOMS Skin blistering/ulceration: no Decrease capillary refill: no Change in skin color: no Decreased motor function or severe tightness: no Decreased pulses distal to site of extravasation: no Altered sensation: no Increasing pain or signs of increased swelling during observation: no TREATMENT Observation period at site: Patient returned to the emergency department for continued observation. Orders placed for limb elevation and application of ice pack. Plastic surgery consulted? no DOCUMENTATION AND FOLLOW-UP Post extravasation orders completed? yes Patient's questions answered? yes Patient instructed to call [phone] or seek  immediate medical care for new/progressive symptoms. FINDINGS: CT CHEST FINDINGS Cardiovascular: Thoracic aortic atherosclerosis without aneurysm. Normal heart size. Scattered coronary artery calcification. No significant pericardial effusion. Mediastinum/Nodes: AP window lymph nodes measure up to 12 mm in short axis, similar to the prior CT. Similar 1.4 cm short axis right hilar lymph node. Mild motion and streak artifact through the subcarinal region limits evaluation of the previously described lymph node in this location. Unremarkable thyroid. Possible mild esophageal wall thickening. Lungs/Pleura: Small right pleural effusion, new from the prior CT. No pneumothorax. Innumerable nodules are again seen diffusely throughout both lungs, greatest in the upper lungs and less in the bases. Bilateral upper lobe airspace consolidation is greatest in the apices, overall stable to slightly progressed since the prior CT. Small areas of cavitation within the biapical consolidation have not significantly progressed. A small amount of patchy subpleural consolidation  anteriorly in the right middle lobe is new from the prior CT, and patchy right middle lobe consolidation laterally has increased. There is mild dependent atelectasis in the right lower lobe. Musculoskeletal: Postsurgical changes at the left shoulder. Mild thoracic disc degeneration. No suspicious osseous lesion. CT ABDOMEN PELVIS FINDINGS The study is mildly motion degraded. Hepatobiliary: No focal liver abnormality is seen. No gallstones, gallbladder wall thickening, or biliary dilatation. Pancreas: Unremarkable. Spleen: Unremarkable. Adrenals/Urinary Tract: Unchanged right adrenal nodule. Unremarkable left adrenal gland. Unchanged 11 mm left lower pole renal hypodensity, likely a cyst. Areas of subtle hypoenhancement in both kidneys on the prior CT cannot be re-evaluated today due to lack of IV contrast. There is no hydronephrosis. Unremarkable bladder. Stomach/Bowel: The stomach is largely collapsed. Scattered fluid is present throughout the colon, and there is also a moderate-sized air-fluid level in the rectum. Fluid is also noted in nondilated small bowel loops. There is no evidence of bowel obstruction. The appendix is grossly unremarkable. Vascular/Lymphatic: Mild abdominal aortic atherosclerosis without aneurysm. No enlarged lymph nodes. Reproductive: Unremarkable prostate. Other: Trace ascites, decreased from prior. No loculated fluid collection. Persistent diffusely edematous appearance of the intra-abdominal fat. Unchanged 1.5 cm calcified mesenteric nodule in the left mid abdomen (series 5, image 76). Musculoskeletal: No suspicious osseous lesion. Lower lumbar disc degeneration including severe disc space narrowing and spurring at L4-5 and L5-S1 resulting in moderate to severe bilateral neural foraminal stenosis. IMPRESSION: 1. Diffuse lung nodules and apical predominant lung consolidation, overall stable to mildly progressed from 05/02/2018 and consistent with the patient's diagnosis of tuberculosis.  2. Small right pleural effusion, new from 05/02/2018. 3. Unchanged mild mediastinal and right hilar lymphadenopathy. 4. Scattered air-fluid levels in the colon and rectum suggesting a diarrheal illness. No bowel obstruction. 5. Trace ascites, decreased from prior. 6. Unchanged calcified mesenteric nodule. 7.  Aortic Atherosclerosis (ICD10-I70.0). 8. Contrast extravasation as documented above. Electronically Signed   By: Logan Bores M.D.   On: 05/23/2018 18:12   Ct Angio Chest Pe W And/or Wo Contrast  Result Date: 05/02/2018 CLINICAL DATA:  Three weeks of decreased appetite and generalized weakness. Shortness of breath with exertion. No fever. EXAM: CT ANGIOGRAPHY CHEST WITH CONTRAST TECHNIQUE: Multidetector CT imaging of the chest was performed using the standard protocol during bolus administration of intravenous contrast. Multiplanar CT image reconstructions and MIPs were obtained to evaluate the vascular anatomy. CONTRAST:  128m ISOVUE-370 IOPAMIDOL (ISOVUE-370) INJECTION 76% COMPARISON:  Chest x-ray May 02, 2018.  Chest CT September 02, 2017 FINDINGS: Cardiovascular: The thoracic aorta demonstrates atherosclerotic change with no aneurysm or dissection. Stairstep artifact is seen, particularly in the  lung bases, limiting evaluation for pulmonary emboli. Taking the artifact into account, there is no convincing evidence of pulmonary emboli. Mediastinum/Nodes: The thyroid is normal. There is a prominent subcarinal node which measures 18 mm and is stable. Shotty nodes in the AP window are stable. There is a prominent right hilar node which was not well assessed on the previous unenhanced study. Lungs/Pleura: Central airways are normal. No pneumothorax. Bilateral pulmonary infiltrates consisting of both numerous small nodules and more confluent regions. The infiltrates are most prominent in the upper lobes with cavitary rounded regions as seen on coronal image 74 measuring up to 3.6 cm in the right upper lobe,  coronal image 67 in the left apex measuring up to 1.9 cm, and coronal image 71 in the left upper lobe measuring up to 1.9 cm. The right middle lobe and lower lobes are involve as well but less severely. No other acute abnormalities identified within the lung parenchyma. Upper Abdomen: Thickening of the right adrenal gland is stable, likely hyperplasia. Increased attenuation in the fat of the omentum within the left upper quadrant is new and nonspecific. Probable minimal ascites. Musculoskeletal: No chest wall abnormality. No acute or significant osseous findings. Review of the MIP images confirms the above findings. IMPRESSION: 1. Bilateral pulmonary infiltrates most prominent in the upper lobes with rounded regions of cavitation in the bilateral apices. Findings are most consistent with an infectious or inflammatory process. Atypical infections such as tuberculosis should be considered. Non tuberculous mycobacterial infections and fungal infections are also possible. Malignancy is considered less likely. Inflammatory causes such as sarcoidosis could also present with upper lobe cavitating nodules. There are a few scattered prominent nodes in the mediastinum which are stable. There is a prominent right hilar node which was not well assessed on previous unenhanced imaging. 2. Atherosclerotic change in the thoracic aorta. 3. Evaluation for pulmonary emboli is limited due to artifact but no emboli are identified. Findings called to the patient's ER doctor, Dr. Maryan Rued. Aortic Atherosclerosis (ICD10-I70.0). Electronically Signed   By: Dorise Bullion III M.D   On: 05/02/2018 18:02   Ct Abdomen Pelvis W Contrast  Result Date: 05/05/2018 CLINICAL DATA:  Abdominal pain. Fever. Abscess suspected. Unintentional weight loss. EXAM: CT ABDOMEN AND PELVIS WITH CONTRAST TECHNIQUE: Multidetector CT imaging of the abdomen and pelvis was performed using the standard protocol following bolus administration of intravenous  contrast. CONTRAST:  100 mL ISOVUE-300 IOPAMIDOL (ISOVUE-300) INJECTION 61%, 183m OMNIPAQUE IOHEXOL 300 MG/ML SOLN COMPARISON:  01/11/2018 FINDINGS: Lower chest: Lung bases show numerous centrilobular nodular opacities, new since the prior exam. There is a small area patchy ground-glass opacity the posterolateral right middle lobe. Minimal right pleural effusion. There is dependent opacity in the posterior right lower lobe adjacent to the pleural effusion that is most likely atelectasis. Hepatobiliary: Liver normal in size and attenuation with no mass or focal lesion. There is gallbladder wall edema and pericholecystic fluid. No visible gallstones. No bile duct dilation. Pancreas: Unremarkable. No pancreatic ductal dilatation or surrounding inflammatory changes. Spleen: Normal in size without focal abnormality. Adrenals/Urinary Tract: 2.6 cm right adrenal mass, stable. Normal left adrenal gland. Subtle areas of relative hypoattenuation in both kidneys, most apparent in the lateral midpole of the left kidney. These may reflect areas bright is. There are new since prior exam stable 11 mm lower pole left renal mass consistent with a cyst. No other discrete renal masses. No stones. No hydronephrosis. Ureters are normal course and in caliber. Bladder is unremarkable. Stomach/Bowel: Stomach is  unremarkable. No bowel dilation or wall thickening. No convincing inflammation. Normal appendix visualized. Vascular/Lymphatic: No pathologically enlarged lymph nodes. Aortic atherosclerosis. No aneurysm. Reproductive: Unremarkable. Other: Small amount of ascites, less than was present on the prior CT. There is no defined collection to suggest an abscess. Hazy opacities noted throughout the peritoneal and omental fat similar to the prior exam. Calcified peritoneal soft tissue mass in the left central abdomen adjacent to a loop of small bowel is stable from the prior study. Musculoskeletal: No fracture or acute finding. No  osteoblastic or osteolytic lesions. IMPRESSION: 1. No evidence of an abscess. 2. There are new bilateral centrilobular pulmonary nodules. Although the differential diagnosis is relatively broad, since these are new, infection with endobronchial spread such as from non tubercular mycobacteria or aspergillosis is suspected. Hypersensitivity pneumonitis is an alternative diagnosis. This may be the source of fever. 3. Ascites, decreased when compared the prior CT. 4. Areas of subtle relative decreased attenuation in both kidneys, which could be due to pyelonephritis. No evidence of a renal or perirenal abscess. 5. Gallbladder wall thickening that is most likely nonspecific edema related to ascites. 6. No evidence of bowel inflammation. 7. Stable calcified mesenteric nodule. Electronically Signed   By: Lajean Manes M.D.   On: 05/05/2018 07:20   Dg Chest Port 1 View  Result Date: 05/23/2018 CLINICAL DATA:  Shortness of breath and fever. EXAM: PORTABLE CHEST 1 VIEW COMPARISON:  05/10/2018 and prior chest radiographs FINDINGS: Cardiomediastinal silhouette is unchanged. Bilateral airspace opacities, greatest in the UPPER lungs, have not significantly changed. No pleural effusion or pneumothorax. No acute bony abnormalities identified. IMPRESSION: Unchanged bilateral airspace opacities, UPPER lobe predominance. Electronically Signed   By: Margarette Canada M.D.   On: 05/23/2018 15:55   Dg Chest Port 1 View  Result Date: 05/10/2018 CLINICAL DATA:  Shortness of breath. EXAM: PORTABLE CHEST 1 VIEW COMPARISON:  Chest CT 05/02/2018 and chest radiograph 05/02/2018. FINDINGS: Cardiomediastinal silhouette is normal. Mediastinal contours appear intact. Low lung volumes. Worsening aeration of the lungs with increasing bilateral upper lobe predominant airspace consolidation, and cavitary spaces in the upper lobes. Osseous structures are without acute abnormality. Soft tissues are grossly normal. IMPRESSION: Worsening aeration of the  lungs with increasing bilateral upper lobe predominant airspace consolidation, and cavitary spaces in the upper lobes. Electronically Signed   By: Fidela Salisbury M.D.   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 He presents for bone marrow biopsy and culture. EXAM: CT GUIDED BONE MARROW ASPIRATION AND CORE BIOPSY Interventional Radiologist:  Criselda Peaches, MD MEDICATIONS: None. ANESTHESIA/SEDATION: Moderate (conscious) sedation was employed during this procedure. A total of 1.5 milligrams versed and 50 micrograms fentanyl were administered intravenously. The patient's level of consciousness and vital signs were monitored continuously by radiology nursing throughout the procedure under my direct supervision. Total monitored sedation time: 10 minutes FLUOROSCOPY TIME:  Fluoroscopy Time: 0 minutes 0 seconds (0 mGy). COMPLICATIONS: None immediate. Estimated blood loss: <25 mL PROCEDURE: Informed written consent was obtained from the patient after a thorough discussion of the procedural risks, benefits and alternatives. All questions were addressed. Maximal Sterile Barrier Technique was utilized including caps, mask, sterile gowns, sterile gloves, sterile drape, hand hygiene and skin antiseptic. A timeout was performed prior to the initiation of the procedure. The patient was positioned prone and non-contrast localization CT was performed of the pelvis to demonstrate the iliac marrow spaces. Maximal  barrier sterile technique utilized including caps, mask, sterile gowns, sterile gloves, large sterile drape, hand hygiene, and betadine prep. Under sterile conditions and local anesthesia, an 11 gauge coaxial bone biopsy needle was advanced into the right iliac marrow space. Needle position was confirmed with CT imaging. Initially, bone marrow aspiration was performed. Next, the 11 gauge outer  cannula was utilized to obtain a right iliac bone marrow core biopsy. Needle was removed. Hemostasis was obtained with compression. The patient tolerated the procedure well. Samples were prepared with the cytotechnologist. IMPRESSION: Technically successful CT-guided bone marrow biopsy, aspiration and bone culture. Signed, Criselda Peaches, MD [facility] Electronically Signed   By: Jacqulynn Cadet M.D.   On: 05/10/2018 09:53   US Abdomen Limited Ruq  Result Date: 05/09/2018 CLINICAL DATA:  Abnormal LFTs EXAM: ULTRASOUND ABDOMEN LIMITED RIGHT UPPER QUADRANT COMPARISON:  CT 05/05/2018 FINDINGS: Gallbladder: Marked gallbladder wall thickening measuring up to 9 mm. No stones or sonographic Murphy's sign. Common bile duct: Diameter: Normal caliber, 4 mm Liver: No focal lesion identified. Within normal limits in parenchymal echogenicity. Portal vein is patent on color Doppler imaging with normal direction of blood flow towards the liver. Also noted is a right pleural effusion and ascites. IMPRESSION: Markedly thickened gallbladder wall. No gallstones visualized. This could be related to liver disease or cholecystitis. Right pleural effusion, mild perihepatic ascites. Electronically Signed   By: Rolm Baptise M.D.   On: 05/09/2018 08:31    Lab Data:  CBC: Recent Labs  Lab 05/23/18 1435 05/23/18 1629 05/23/18 1721 05/23/18 2321 05/24/18 0346  WBC 19.7*  --   --  12.1* 11.5*  NEUTROABS 17.4*  --   --   --  10.8*  HGB 10.6* 10.5* 11.2* 11.7* 11.9*  HCT 32.0* 31.0* 33.0* 36.7* 35.7*  MCV 77.5*  --   --  81.2 77.4*  PLT 130*  --   --  118* 503*   Basic Metabolic Panel: Recent Labs  Lab 05/23/18 1435 05/23/18 1452 05/23/18 1629 05/23/18 1656 05/23/18 1721 05/23/18 2321 05/24/18 0346  NA 140  --  139 139 139  --  138  K 2.8*  --  3.2* 3.1* 2.8*  --  3.7  CL 113*  --  111 109 104  --  111  CO2 17*  --   --  20*  --   --  19*    GLUCOSE 55*  --  47* 59* 59*  --  75  BUN 8  --  9 9 10   --  7  CREATININE 0.92  --  0.60* 1.07 0.80 0.87 0.97  CALCIUM 6.1*  --   --  7.0*  --   --  7.2*  MG  --  1.4*  --   --   --  1.4*  --    GFR: Estimated Creatinine Clearance: 72.8 mL/min (by C-G formula based on SCr of 0.97 mg/dL). Liver Function Tests: Recent Labs  Lab 05/23/18 1435 05/24/18 0346  AST 108* 115*  ALT 33 40  ALKPHOS 458* 515*  BILITOT 1.1 1.2  PROT 3.6* 4.6*  ALBUMIN 1.3* 1.6*   Recent Labs  Lab 05/23/18 1452  LIPASE 23   No results for input(s): AMMONIA in the last 168 hours. Coagulation Profile: Recent Labs  Lab 05/23/18 1435  INR 1.73   Cardiac Enzymes: No results for input(s): CKTOTAL, CKMB, CKMBINDEX, TROPONINI in the last 168 hours. BNP (last 3 results) No results for input(s): PROBNP  in the last 8760 hours. HbA1C: No results for input(s): HGBA1C in the last 72 hours. CBG: Recent Labs  Lab 05/24/18 0113 05/24/18 0214 05/24/18 0451 05/24/18 0521 05/24/18 0806  GLUCAP 57* 73 63* 135* 69   Lipid Profile: No results for input(s): CHOL, HDL, LDLCALC, TRIG, CHOLHDL, LDLDIRECT in the last 72 hours. Thyroid Function Tests: No results for input(s): TSH, T4TOTAL, FREET4, T3FREE, THYROIDAB in the last 72 hours. Anemia Panel: No results for input(s): VITAMINB12, FOLATE, FERRITIN, TIBC, IRON, RETICCTPCT in the last 72 hours. Urine analysis:    Component Value Date/Time   COLORURINE YELLOW 05/24/2018 0137   APPEARANCEUR CLEAR 05/24/2018 0137   LABSPEC 1.035 (H) 05/24/2018 0137   PHURINE 5.0 05/24/2018 0137   GLUCOSEU NEGATIVE 05/24/2018 0137   HGBUR NEGATIVE 05/24/2018 0137   BILIRUBINUR NEGATIVE 05/24/2018 0137   KETONESUR NEGATIVE 05/24/2018 0137   PROTEINUR NEGATIVE 05/24/2018 0137   UROBILINOGEN 0.2 02/04/2010 1718   NITRITE NEGATIVE 05/24/2018 0137   LEUKOCYTESUR NEGATIVE 05/24/2018 0137     Rhonna Holster M.D. [facility] 05/24/2018, 9:32 AM  Pager:  [phone] Between 7am to 7pm - call Pager - [phone]  After 7pm go to [email or web address] - password [email or web address]  Call night coverage person covering after 7pm

## 2018-05-25 ENCOUNTER — Inpatient Hospital Stay (HOSPITAL_COMMUNITY): Payer: Medicare Other

## 2018-05-25 DIAGNOSIS — I10 Essential (primary) hypertension: Secondary | ICD-10-CM

## 2018-05-25 LAB — BASIC METABOLIC PANEL
ANION GAP: 5 (ref 5–15)
BUN: 7 mg/dL (ref 6–20)
CO2: 19 mmol/L — AB (ref 22–32)
Calcium: 7.1 mg/dL — ABNORMAL LOW (ref 8.9–10.3)
Chloride: 116 mmol/L — ABNORMAL HIGH (ref 101–111)
Creatinine, Ser: 0.82 mg/dL (ref 0.61–1.24)
GLUCOSE: 68 mg/dL (ref 65–99)
POTASSIUM: 3.6 mmol/L (ref 3.5–5.1)
Sodium: 140 mmol/L (ref 135–145)

## 2018-05-25 LAB — GLUCOSE, CAPILLARY
GLUCOSE-CAPILLARY: 127 mg/dL — AB (ref 65–99)
GLUCOSE-CAPILLARY: 61 mg/dL — AB (ref 65–99)
GLUCOSE-CAPILLARY: 66 mg/dL (ref 65–99)
Glucose-Capillary: 92 mg/dL (ref 65–99)
Glucose-Capillary: 88 mg/dL (ref 65–99)
Glucose-Capillary: 66 mg/dL (ref 65–99)

## 2018-05-25 LAB — CBC
HEMATOCRIT: 35 % — AB (ref 39.0–52.0)
HEMOGLOBIN: 11.5 g/dL — AB (ref 13.0–17.0)
MCH: 25.9 pg — AB (ref 26.0–34.0)
MCHC: 32.9 g/dL (ref 30.0–36.0)
MCV: 78.8 fL (ref 78.0–100.0)
Platelets: 138 10*3/uL — ABNORMAL LOW (ref 150–400)
RBC: 4.44 MIL/uL (ref 4.22–5.81)
RDW: 17.2 % — ABNORMAL HIGH (ref 11.5–15.5)
WBC: 11.8 10*3/uL — ABNORMAL HIGH (ref 4.0–10.5)

## 2018-05-25 LAB — URINE CULTURE: CULTURE: NO GROWTH

## 2018-05-25 LAB — ECHOCARDIOGRAM COMPLETE
HEIGHTINCHES: 74 in
Weight: 2416 oz

## 2018-05-25 MED ORDER — VITAMIN B-6 50 MG PO TABS
50.0000 mg | ORAL_TABLET | Freq: Every day | ORAL | Status: DC
  Administered 2018-05-26 – 2018-06-04 (×10): 50 mg via ORAL
  Filled 2018-05-25 (×11): qty 1

## 2018-05-25 MED ORDER — DEXTROSE 50 % IV SOLN
INTRAVENOUS | Status: AC
  Administered 2018-05-25: 22:00:00
  Filled 2018-05-25: qty 50

## 2018-05-25 MED ORDER — DEXTROSE 50 % IV SOLN
1.0000 | Freq: Once | INTRAVENOUS | Status: AC
  Administered 2018-05-25: 50 mL via INTRAVENOUS
  Filled 2018-05-25: qty 50

## 2018-05-25 MED ORDER — ONDANSETRON HCL 4 MG/2ML IJ SOLN: 4.0000 mg | Freq: Three times a day (TID) | INTRAMUSCULAR | Status: DC

## 2018-05-25 MED ORDER — ADULT MULTIVITAMIN W/MINERALS CH
1.0000 | ORAL_TABLET | Freq: Every day | ORAL | Status: DC
  Administered 2018-05-28 – 2018-06-04 (×9): 1 via ORAL
  Filled 2018-05-25 (×10): qty 1

## 2018-05-25 MED ORDER — METOCLOPRAMIDE HCL 5 MG/ML IJ SOLN
10.0000 mg | Freq: Four times a day (QID) | INTRAMUSCULAR | Status: DC
  Administered 2018-05-25 – 2018-05-27 (×8): 10 mg via INTRAVENOUS
  Filled 2018-05-25 (×7): qty 2

## 2018-05-25 NOTE — Progress Notes (Signed)
[facility]                                                                              Patient Demographics  Jeffrey Hayes, is a 67 y.o. male, DOB - July 16, 1951, HKV:[id number]  Admit date - 05/23/2018   Admitting Physician Rise Patience, MD  Outpatient Primary MD for the patient is [name], MD  Outpatient specialists:   LOS - 2  days   Medical records reviewed and are as summarized below:    Chief Complaint  Patient presents with  . Chest Pain  . Fever  . Diarrhea       Brief summary   Jeffrey Hayes is a 67 y.o. male with recently diagnosed pulmonary tuberculosis discharged home on antituberculosis medications presents to the ER with complaints of persistent nausea vomiting and diarrhea over the last few days.  Also has been having hiccups.  Has been feeling weak.  Has not been able to eat anything because of the vomiting.  Patient has been compliant with his anti-tuberculosis medications.  In ED, patient was febrile, tachycardia, lactic acidosis, hypokalemia, hypomagnesemia. CT chest abdomen and pelvis showed fused lung nodules and apical predominant lung consolidation, small right pleural effusion, scattered air-fluid levels in the colon and rectum suggesting viral illness, no bowel obstruction.      Assessment & Plan    Principal Problem:   Sepsis ([place]), bilateral cavitary pneumonia, active tuberculosis, in the setting of sarcoidosis -ID consulted, followed by Dr. Graylon Good, recommended to continue [place] anti TB meds with vitamin B6  Active Problems: Nausea, vomiting, diarrhea -Possibly due to medications, CT abdomen showed no obstruction or acute abdominal pathology -C. difficile negative -Follow GI pathogen panel -Placed on scheduled Zofran however still having vomiting, DC Zofran, added scheduled Reglan, continue as needed Phenergan -Patient will benefit more if he receives antiemetics prior to the anti-TB meds.  Transaminitis with  obstructive pattern -Possibly due to active TB and medications Patient had-right upper quadrant ultrasound on 05/09/2018 which showed no gallstones however markedly thickened gallbladder wall could be related to liver disease or cholecystitis. -CT abdomen pelvis on admission showed no gallstones, gallbladder wall thickening or biliary dilatation, no focal liver abnormality. 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  -Patient underwent bone marrow biopsy during the previous admission which showed normocellular marrow with numerous granulomata  Type 2 diabetes mellitus -Continue sliding scale insulin -Hemoglobin A1c 11.8 -Amaryl and metformin were discontinued during the previous admission  Essential hypertension -BP currently stable  hypokalemia, hypomagnesemia -Stable  Severe protein calorie malnutrition with hypoalbuminemia -Nutrition consulted, albumin 1.6   Mildly elevated troponin -Likely due to #1, no chest pain, follow 2D echo results   Code Status: Full CODE STATUS DVT Prophylaxis:  Lovenox Family Communication: Discussed in detail with the patient, all imaging results, lab results explained to the patient    Disposition Plan:   Time Spent in minutes  25 minutes  Procedures:  2D echo  Consultants:   Infectious disease  Antimicrobials:      Medications  Scheduled Meds: . enoxaparin (LOVENOX) injection  40 mg Subcutaneous Q24H  . ethambutol  1,200 mg Oral Daily  . feeding supplement  1 Container Oral TID BM  . isoniazid  300 mg Oral Daily  . metoCLOPramide (REGLAN) injection  10 mg Intravenous Q6H  . multivitamin with minerals  1 tablet Oral Daily  . ondansetron (ZOFRAN) IV  4 mg Intravenous Q8H  . pyrazinamide  1,500 mg Oral Daily  . pyridOXINE  50 mg Oral Daily  . rifampin  600 mg Oral Daily   Continuous Infusions: . sodium chloride Stopped (05/25/18 0649)  .  sodium chloride Stopped (05/24/18 1330)   PRN Meds:.acetaminophen **OR** acetaminophen, loperamide, promethazine   Antibiotics   Anti-infectives (From admission, onward)   Start     Dose/Rate Route Frequency Ordered Stop   05/24/18 1000  ethambutol (MYAMBUTOL) tablet 1,200 mg     1,200 mg Oral Daily 05/23/18 2104     05/24/18 1000  isoniazid (NYDRAZID) tablet 300 mg     300 mg Oral Daily 05/23/18 2104     05/24/18 1000  pyrazinamide tablet 1,500 mg     1,500 mg Oral Daily 05/23/18 2104     05/24/18 1000  rifampin (RIFADIN) capsule 600 mg     600 mg Oral Daily 05/23/18 2104     05/23/18 2330  piperacillin-tazobactam (ZOSYN) IVPB 3.375 g  Status:  Discontinued     3.375 g 12.5 mL/hr over 240 Minutes Intravenous Every 8 hours 05/23/18 1529 05/25/18 1237   05/23/18 2330  vancomycin (VANCOCIN) IVPB 750 mg/150 ml premix  Status:  Discontinued     750 mg 150 mL/hr over 60 Minutes Intravenous Every 8 hours 05/23/18 1530 05/23/18 2104   05/23/18 1530  piperacillin-tazobactam (ZOSYN) IVPB 3.375 g     3.375 g 100 mL/hr over 30 Minutes Intravenous  Once 05/23/18 1515 05/23/18 1625   05/23/18 1530  vancomycin (VANCOCIN) IVPB 1000 mg/200 mL premix  Status:  Discontinued     1,000 mg 200 mL/hr over 60 Minutes Intravenous  Once 05/23/18 1515 05/23/18 1528   05/23/18 1530  vancomycin (VANCOCIN) 1,500 mg in sodium chloride 0.9 % 500 mL IVPB     1,500 mg 250 mL/hr over 120 Minutes Intravenous  Once 05/23/18 1528 05/23/18 1807        Subjective:   Jeffrey Hayes was seen and examined today.  Low-grade temp of 99.5 F.  States had vomiting and diarrhea earlier this morning.  No hematochezia or melena.  No chest pain or shortness of breath.    Objective:   Vitals:   05/24/18 1737 05/24/18 2006 05/25/18 0457 05/25/18 0900  BP: 90/70 (!) 88/64 101/77 109/72  Pulse: (!) 116 (!) 114 (!) 105 (!) 108  Resp: _0 Temp: 99.8 F (37.7 C) 99.5 F (37.5 C) 99.5 F (37.5 C) 99 F (37.2 C)    TempSrc: Oral Oral  Oral  SpO2: 92% 94% 94% 96%  Weight:      Height:        Intake/Output Summary (Last 24 hours) at 05/25/2018 1237 Last data filed at 05/25/2018 1000 Gross per 24 hour  Intake 505.11 ml  Output 350 ml  Net 155.11 ml     Wt Readings from Last 3 Encounters:  05/24/18 68.5 kg (151 lb)  05/14/18 68.7 kg (151 lb 7.3 oz)  03/11/18 75.3 kg (166 lb)     Exam    General: Alert and oriented x 3, NAD, cachectic, ill-appearing  Eyes:   HEENT:   Cardiovascular:  S1 S2 auscultated, Regular rate and rhythm. No pedal edema b/l  Respiratory: Decreased breath sound at the bases, no wheezing  Gastrointestinal: Soft, nontender, nondistended, + bowel sounds  Ext: no pedal edema bilaterally  Neuro: no FND's  Musculoskeletal: No digital cyanosis, clubbing  Skin: No rashes  Psych: Normal affect and demeanor, alert and oriented x3     Data Reviewed:  I have personally reviewed following labs and imaging studies  Micro Results Recent Results (from the past 240 hour(s))  Culture, blood (Routine x 2)     Status: None (Preliminary result)   Collection Time: 05/23/18  2:53 PM  Result Value Ref Range Status   Specimen Description BLOOD RIGHT ANTECUBITAL  Final   Special Requests   Final    BOTTLES DRAWN AEROBIC AND ANAEROBIC Blood Culture adequate volume Performed at [facility], [address] [place], [place] [address]    Culture NO GROWTH < 24 HOURS  Final   Report Status PENDING  Incomplete  Culture, blood (Routine x 2)     Status: None (Preliminary result)   Collection Time: 05/23/18  3:43 PM  Result Value Ref Range Status   Specimen Description BLOOD BLOOD LEFT FOREARM  Final   Special Requests   Final    BOTTLES DRAWN AEROBIC ONLY Blood Culture results may not be optimal due to an inadequate volume of blood received in culture bottles   Culture NO GROWTH < 24 HOURS  Final   Report Status PENDING  Incomplete  C difficile quick scan w PCR reflex      Status: None   Collection Time: 05/23/18 11:00 PM  Result Value Ref Range Status   C Diff antigen NEGATIVE NEGATIVE Final   C Diff toxin NEGATIVE NEGATIVE Final   C Diff interpretation No C. difficile detected.  Final    Comment: Performed at [facility], [place] [address], [place], [place] [address]  Urine culture     Status: None   Collection Time: 05/24/18  1:37 AM  Result Value Ref Range Status   Specimen Description URINE, CLEAN CATCH  Final   Special Requests NONE  Final   Culture   Final    NO GROWTH Performed at [facility], [place] [address], [place], [place] [address]    Report Status 05/25/2018 FINAL  Final    Radiology Reports Ct Abdomen Pelvis Wo Contrast  Result Date: 05/23/2018 CLINICAL DATA:  Tuberculosis.  Worsening cough and fever. EXAM: CT CHEST, ABDOMEN AND PELVIS WITHOUT CONTRAST TECHNIQUE: Multidetector CT imaging of the chest, abdomen and pelvis was performed following the standard protocol without IV contrast. COMPARISON:  Chest radiograph 05/23/2018. Chest CTA 05/02/2018. CT abdomen and pelvis 05/05/2018. CONTRAST EXTRAVASATION CONSULTATION: Type of contrast:  Isovue 300 Site of extravasation: Left antecubital fossa Estimated volume of extravasation: 100 ml Area of extravasation scanned with CT? no PATIENT'S SIGNS AND SYMPTOMS Skin blistering/ulceration: no Decrease capillary refill: no Change in skin color: no Decreased motor function or severe tightness: no Decreased pulses distal to site of extravasation: no Altered sensation: no Increasing pain or signs of increased swelling during observation: no TREATMENT Observation period at site: Patient returned to the emergency department for continued observation. Orders placed for limb elevation and application of ice pack. Plastic surgery consulted? no DOCUMENTATION AND FOLLOW-UP Post extravasation orders completed? yes Patient's questions answered? yes Patient instructed to call [phone] or seek immediate  medical care for new/progressive symptoms. FINDINGS: CT CHEST FINDINGS Cardiovascular: Thoracic aortic atherosclerosis  without aneurysm. Normal heart size. Scattered coronary artery calcification. No significant pericardial effusion. Mediastinum/Nodes: AP window lymph nodes measure up to 12 mm in short axis, similar to the prior CT. Similar 1.4 cm short axis right hilar lymph node. Mild motion and streak artifact through the subcarinal region limits evaluation of the previously described lymph node in this location. Unremarkable thyroid. Possible mild esophageal wall thickening. Lungs/Pleura: Small right pleural effusion, new from the prior CT. No pneumothorax. Innumerable nodules are again seen diffusely throughout both lungs, greatest in the upper lungs and less in the bases. Bilateral upper lobe airspace consolidation is greatest in the apices, overall stable to slightly progressed since the prior CT. Small areas of cavitation within the biapical consolidation have not significantly progressed. A small amount of patchy subpleural consolidation anteriorly in the right middle lobe is new from the prior CT, and patchy right middle lobe consolidation laterally has increased. There is mild dependent atelectasis in the right lower lobe. Musculoskeletal: Postsurgical changes at the left shoulder. Mild thoracic disc degeneration. No suspicious osseous lesion. CT ABDOMEN PELVIS FINDINGS The study is mildly motion degraded. Hepatobiliary: No focal liver abnormality is seen. No gallstones, gallbladder wall thickening, or biliary dilatation. Pancreas: Unremarkable. Spleen: Unremarkable. Adrenals/Urinary Tract: Unchanged right adrenal nodule. Unremarkable left adrenal gland. Unchanged 11 mm left lower pole renal hypodensity, likely a cyst. Areas of subtle hypoenhancement in both kidneys on the prior CT cannot be re-evaluated today due to lack of IV contrast. There is no hydronephrosis. Unremarkable bladder. Stomach/Bowel: The  stomach is largely collapsed. Scattered fluid is present throughout the colon, and there is also a moderate-sized air-fluid level in the rectum. Fluid is also noted in nondilated small bowel loops. There is no evidence of bowel obstruction. The appendix is grossly unremarkable. Vascular/Lymphatic: Mild abdominal aortic atherosclerosis without aneurysm. No enlarged lymph nodes. Reproductive: Unremarkable prostate. Other: Trace ascites, decreased from prior. No loculated fluid collection. Persistent diffusely edematous appearance of the intra-abdominal fat. Unchanged 1.5 cm calcified mesenteric nodule in the left mid abdomen (series 5, image 76). Musculoskeletal: No suspicious osseous lesion. Lower lumbar disc degeneration including severe disc space narrowing and spurring at L4-5 and L5-S1 resulting in moderate to severe bilateral neural foraminal stenosis. IMPRESSION: 1. Diffuse lung nodules and apical predominant lung consolidation, overall stable to mildly progressed from 05/02/2018 and consistent with the patient's diagnosis of tuberculosis. 2. Small right pleural effusion, new from 05/02/2018. 3. Unchanged mild mediastinal and right hilar lymphadenopathy. 4. Scattered air-fluid levels in the colon and rectum suggesting a diarrheal illness. No bowel obstruction. 5. Trace ascites, decreased from prior. 6. Unchanged calcified mesenteric nodule. 7.  Aortic Atherosclerosis (ICD10-I70.0). 8. Contrast extravasation as documented above. Electronically Signed   By: Logan Bores M.D.   On: 05/23/2018 18:12   Dg Chest 2 View  Result Date: 05/02/2018 CLINICAL DATA:  67 year old male with cirrhosis. Weakness and loss of appetite. EXAM: CHEST - 2 VIEW COMPARISON:  Chest CT 08/25/2017, radiographs 06/25/2016. FINDINGS: Semi upright AP and lateral views of the chest. Diffuse reticulonodular pulmonary opacity with an upper lobe predominance, and some semi confluent areas of upper lobe involvement. No superimposed  pneumothorax or pleural effusion. Mediastinal contours remain normal. Visualized tracheal air column is within normal limits. Chronic postoperative changes to the left scapula. No acute osseous abnormality identified. Negative visible bowel gas pattern. IMPRESSION: Diffuse pulmonary reticulonodular opacity with some mildly confluent areas in the bilateral upper lobes. No pleural effusion. The appearance is nonspecific. Favor acute viral/atypical respiratory infection. Characterization  with Chest CT (noncontrast probably would suffice) may be valuable. Electronically Signed   By: Genevie Ann M.D.   On: 05/02/2018 16:30   Ct Chest Wo Contrast  Result Date: 05/23/2018 CLINICAL DATA:  Tuberculosis.  Worsening cough and fever. EXAM: CT CHEST, ABDOMEN AND PELVIS WITHOUT CONTRAST TECHNIQUE: Multidetector CT imaging of the chest, abdomen and pelvis was performed following the standard protocol without IV contrast. COMPARISON:  Chest radiograph 05/23/2018. Chest CTA 05/02/2018. CT abdomen and pelvis 05/05/2018. CONTRAST EXTRAVASATION CONSULTATION: Type of contrast:  Isovue 300 Site of extravasation: Left antecubital fossa Estimated volume of extravasation: 100 ml Area of extravasation scanned with CT? no PATIENT'S SIGNS AND SYMPTOMS Skin blistering/ulceration: no Decrease capillary refill: no Change in skin color: no Decreased motor function or severe tightness: no Decreased pulses distal to site of extravasation: no Altered sensation: no Increasing pain or signs of increased swelling during observation: no TREATMENT Observation period at site: Patient returned to the emergency department for continued observation. Orders placed for limb elevation and application of ice pack. Plastic surgery consulted? no DOCUMENTATION AND FOLLOW-UP Post extravasation orders completed? yes Patient's questions answered? yes Patient instructed to call [phone] or seek immediate medical care for new/progressive symptoms. FINDINGS: CT CHEST  FINDINGS Cardiovascular: Thoracic aortic atherosclerosis without aneurysm. Normal heart size. Scattered coronary artery calcification. No significant pericardial effusion. Mediastinum/Nodes: AP window lymph nodes measure up to 12 mm in short axis, similar to the prior CT. Similar 1.4 cm short axis right hilar lymph node. Mild motion and streak artifact through the subcarinal region limits evaluation of the previously described lymph node in this location. Unremarkable thyroid. Possible mild esophageal wall thickening. Lungs/Pleura: Small right pleural effusion, new from the prior CT. No pneumothorax. Innumerable nodules are again seen diffusely throughout both lungs, greatest in the upper lungs and less in the bases. Bilateral upper lobe airspace consolidation is greatest in the apices, overall stable to slightly progressed since the prior CT. Small areas of cavitation within the biapical consolidation have not significantly progressed. A small amount of patchy subpleural consolidation anteriorly in the right middle lobe is new from the prior CT, and patchy right middle lobe consolidation laterally has increased. There is mild dependent atelectasis in the right lower lobe. Musculoskeletal: Postsurgical changes at the left shoulder. Mild thoracic disc degeneration. No suspicious osseous lesion. CT ABDOMEN PELVIS FINDINGS The study is mildly motion degraded. Hepatobiliary: No focal liver abnormality is seen. No gallstones, gallbladder wall thickening, or biliary dilatation. Pancreas: Unremarkable. Spleen: Unremarkable. Adrenals/Urinary Tract: Unchanged right adrenal nodule. Unremarkable left adrenal gland. Unchanged 11 mm left lower pole renal hypodensity, likely a cyst. Areas of subtle hypoenhancement in both kidneys on the prior CT cannot be re-evaluated today due to lack of IV contrast. There is no hydronephrosis. Unremarkable bladder. Stomach/Bowel: The stomach is largely collapsed. Scattered fluid is present  throughout the colon, and there is also a moderate-sized air-fluid level in the rectum. Fluid is also noted in nondilated small bowel loops. There is no evidence of bowel obstruction. The appendix is grossly unremarkable. Vascular/Lymphatic: Mild abdominal aortic atherosclerosis without aneurysm. No enlarged lymph nodes. Reproductive: Unremarkable prostate. Other: Trace ascites, decreased from prior. No loculated fluid collection. Persistent diffusely edematous appearance of the intra-abdominal fat. Unchanged 1.5 cm calcified mesenteric nodule in the left mid abdomen (series 5, image 76). Musculoskeletal: No suspicious osseous lesion. Lower lumbar disc degeneration including severe disc space narrowing and spurring at L4-5 and L5-S1 resulting in moderate to severe bilateral neural foraminal stenosis. IMPRESSION: 1.  Diffuse lung nodules and apical predominant lung consolidation, overall stable to mildly progressed from 05/02/2018 and consistent with the patient's diagnosis of tuberculosis. 2. Small right pleural effusion, new from 05/02/2018. 3. Unchanged mild mediastinal and right hilar lymphadenopathy. 4. Scattered air-fluid levels in the colon and rectum suggesting a diarrheal illness. No bowel obstruction. 5. Trace ascites, decreased from prior. 6. Unchanged calcified mesenteric nodule. 7.  Aortic Atherosclerosis (ICD10-I70.0). 8. Contrast extravasation as documented above. Electronically Signed   By: Logan Bores M.D.   On: 05/23/2018 18:12   Ct Angio Chest Pe W And/or Wo Contrast  Result Date: 05/02/2018 CLINICAL DATA:  Three weeks of decreased appetite and generalized weakness. Shortness of breath with exertion. No fever. EXAM: CT ANGIOGRAPHY CHEST WITH CONTRAST TECHNIQUE: Multidetector CT imaging of the chest was performed using the standard protocol during bolus administration of intravenous contrast. Multiplanar CT image reconstructions and MIPs were obtained to evaluate the vascular anatomy. CONTRAST:   132m ISOVUE-370 IOPAMIDOL (ISOVUE-370) INJECTION 76% COMPARISON:  Chest x-ray May 02, 2018.  Chest CT September 02, 2017 FINDINGS: Cardiovascular: The thoracic aorta demonstrates atherosclerotic change with no aneurysm or dissection. Stairstep artifact is seen, particularly in the lung bases, limiting evaluation for pulmonary emboli. Taking the artifact into account, there is no convincing evidence of pulmonary emboli. Mediastinum/Nodes: The thyroid is normal. There is a prominent subcarinal node which measures 18 mm and is stable. Shotty nodes in the AP window are stable. There is a prominent right hilar node which was not well assessed on the previous unenhanced study. Lungs/Pleura: Central airways are normal. No pneumothorax. Bilateral pulmonary infiltrates consisting of both numerous small nodules and more confluent regions. The infiltrates are most prominent in the upper lobes with cavitary rounded regions as seen on coronal image 74 measuring up to 3.6 cm in the right upper lobe, coronal image 67 in the left apex measuring up to 1.9 cm, and coronal image 71 in the left upper lobe measuring up to 1.9 cm. The right middle lobe and lower lobes are involve as well but less severely. No other acute abnormalities identified within the lung parenchyma. Upper Abdomen: Thickening of the right adrenal gland is stable, likely hyperplasia. Increased attenuation in the fat of the omentum within the left upper quadrant is new and nonspecific. Probable minimal ascites. Musculoskeletal: No chest wall abnormality. No acute or significant osseous findings. Review of the MIP images confirms the above findings. IMPRESSION: 1. Bilateral pulmonary infiltrates most prominent in the upper lobes with rounded regions of cavitation in the bilateral apices. Findings are most consistent with an infectious or inflammatory process. Atypical infections such as tuberculosis should be considered. Non tuberculous mycobacterial infections and  fungal infections are also possible. Malignancy is considered less likely. Inflammatory causes such as sarcoidosis could also present with upper lobe cavitating nodules. There are a few scattered prominent nodes in the mediastinum which are stable. There is a prominent right hilar node which was not well assessed on previous unenhanced imaging. 2. Atherosclerotic change in the thoracic aorta. 3. Evaluation for pulmonary emboli is limited due to artifact but no emboli are identified. Findings called to the patient's ER doctor, Dr. PMaryan Rued Aortic Atherosclerosis (ICD10-I70.0). Electronically Signed   By: DDorise BullionIII M.D   On: 05/02/2018 18:02   Ct Abdomen Pelvis W Contrast  Result Date: 05/05/2018 CLINICAL DATA:  Abdominal pain. Fever. Abscess suspected. Unintentional weight loss. EXAM: CT ABDOMEN AND PELVIS WITH CONTRAST TECHNIQUE: Multidetector CT imaging of the abdomen and pelvis was  performed using the standard protocol following bolus administration of intravenous contrast. CONTRAST:  100 mL ISOVUE-300 IOPAMIDOL (ISOVUE-300) INJECTION 61%, 148m OMNIPAQUE IOHEXOL 300 MG/ML SOLN COMPARISON:  01/11/2018 FINDINGS: Lower chest: Lung bases show numerous centrilobular nodular opacities, new since the prior exam. There is a small area patchy ground-glass opacity the posterolateral right middle lobe. Minimal right pleural effusion. There is dependent opacity in the posterior right lower lobe adjacent to the pleural effusion that is most likely atelectasis. Hepatobiliary: Liver normal in size and attenuation with no mass or focal lesion. There is gallbladder wall edema and pericholecystic fluid. No visible gallstones. No bile duct dilation. Pancreas: Unremarkable. No pancreatic ductal dilatation or surrounding inflammatory changes. Spleen: Normal in size without focal abnormality. Adrenals/Urinary Tract: 2.6 cm right adrenal mass, stable. Normal left adrenal gland. Subtle areas of relative hypoattenuation in  both kidneys, most apparent in the lateral midpole of the left kidney. These may reflect areas bright is. There are new since prior exam stable 11 mm lower pole left renal mass consistent with a cyst. No other discrete renal masses. No stones. No hydronephrosis. Ureters are normal course and in caliber. Bladder is unremarkable. Stomach/Bowel: Stomach is unremarkable. No bowel dilation or wall thickening. No convincing inflammation. Normal appendix visualized. Vascular/Lymphatic: No pathologically enlarged lymph nodes. Aortic atherosclerosis. No aneurysm. Reproductive: Unremarkable. Other: Small amount of ascites, less than was present on the prior CT. There is no defined collection to suggest an abscess. Hazy opacities noted throughout the peritoneal and omental fat similar to the prior exam. Calcified peritoneal soft tissue mass in the left central abdomen adjacent to a loop of small bowel is stable from the prior study. Musculoskeletal: No fracture or acute finding. No osteoblastic or osteolytic lesions. IMPRESSION: 1. No evidence of an abscess. 2. There are new bilateral centrilobular pulmonary nodules. Although the differential diagnosis is relatively broad, since these are new, infection with endobronchial spread such as from non tubercular mycobacteria or aspergillosis is suspected. Hypersensitivity pneumonitis is an alternative diagnosis. This may be the source of fever. 3. Ascites, decreased when compared the prior CT. 4. Areas of subtle relative decreased attenuation in both kidneys, which could be due to pyelonephritis. No evidence of a renal or perirenal abscess. 5. Gallbladder wall thickening that is most likely nonspecific edema related to ascites. 6. No evidence of bowel inflammation. 7. Stable calcified mesenteric nodule. Electronically Signed   By: DLajean ManesM.D.   On: 05/05/2018 07:20   Dg Chest Port 1 View  Result Date: 05/23/2018 CLINICAL DATA:  Shortness of breath and fever. EXAM:  PORTABLE CHEST 1 VIEW COMPARISON:  05/10/2018 and prior chest radiographs FINDINGS: Cardiomediastinal silhouette is unchanged. Bilateral airspace opacities, greatest in the UPPER lungs, have not significantly changed. No pleural effusion or pneumothorax. No acute bony abnormalities identified. IMPRESSION: Unchanged bilateral airspace opacities, UPPER lobe predominance. Electronically Signed   By: JMargarette CanadaM.D.   On: 05/23/2018 15:55   Dg Chest Port 1 View  Result Date: 05/10/2018 CLINICAL DATA:  Shortness of breath. EXAM: PORTABLE CHEST 1 VIEW COMPARISON:  Chest CT 05/02/2018 and chest radiograph 05/02/2018. FINDINGS: Cardiomediastinal silhouette is normal. Mediastinal contours appear intact. Low lung volumes. Worsening aeration of the lungs with increasing bilateral upper lobe predominant airspace consolidation, and cavitary spaces in the upper lobes. Osseous structures are without acute abnormality. Soft tissues are grossly normal. IMPRESSION: Worsening aeration of the lungs with increasing bilateral upper lobe predominant airspace consolidation, and cavitary spaces in the upper lobes. Electronically Signed  By: Fidela Salisbury M.D.   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 He presents for bone marrow biopsy and culture. EXAM: CT GUIDED BONE MARROW ASPIRATION AND CORE BIOPSY Interventional Radiologist:  Criselda Peaches, MD MEDICATIONS: None. ANESTHESIA/SEDATION: Moderate (conscious) sedation was employed during this procedure. A total of 1.5 milligrams versed and 50 micrograms fentanyl were administered intravenously. The patient's level of consciousness and vital signs were monitored continuously by radiology nursing throughout the procedure under my direct supervision. Total monitored sedation time: 10 minutes FLUOROSCOPY TIME:  Fluoroscopy Time: 0 minutes 0 seconds (0 mGy).  COMPLICATIONS: None immediate. Estimated blood loss: <25 mL PROCEDURE: Informed written consent was obtained from the patient after a thorough discussion of the procedural risks, benefits and alternatives. All questions were addressed. Maximal Sterile Barrier Technique was utilized including caps, mask, sterile gowns, sterile gloves, sterile drape, hand hygiene and skin antiseptic. A timeout was performed prior to the initiation of the procedure. The patient was positioned prone and non-contrast localization CT was performed of the pelvis to demonstrate the iliac marrow spaces. Maximal barrier sterile technique utilized including caps, mask, sterile gowns, sterile gloves, large sterile drape, hand hygiene, and betadine prep. Under sterile conditions and local anesthesia, an 11 gauge coaxial bone biopsy needle was advanced into the right iliac marrow space. Needle position was confirmed with CT imaging. Initially, bone marrow aspiration was performed. Next, the 11 gauge outer cannula was utilized to obtain a right iliac bone marrow core biopsy. Needle was removed. Hemostasis was obtained with compression. The patient tolerated the procedure well. Samples were prepared with the cytotechnologist. IMPRESSION: Technically successful CT-guided bone marrow biopsy, aspiration and bone culture. Signed, Criselda Peaches, MD [facility] Electronically Signed   By: Jacqulynn Cadet M.D.   On: 05/10/2018 09:53   US Abdomen Limited Ruq  Result Date: 05/09/2018 CLINICAL DATA:  Abnormal LFTs EXAM: ULTRASOUND ABDOMEN LIMITED RIGHT UPPER QUADRANT COMPARISON:  CT 05/05/2018 FINDINGS: Gallbladder: Marked gallbladder wall thickening measuring up to 9 mm. No stones or sonographic Murphy's sign. Common bile duct: Diameter: Normal caliber, 4 mm Liver: No focal lesion identified. Within normal limits in parenchymal echogenicity. Portal vein is patent on color Doppler imaging  with normal direction of blood flow towards the liver. Also noted is a right pleural effusion and ascites. IMPRESSION: Markedly thickened gallbladder wall. No gallstones visualized. This could be related to liver disease or cholecystitis. Right pleural effusion, mild perihepatic ascites. Electronically Signed   By: Rolm Baptise M.D.   On: 05/09/2018 08:31    Lab Data:  CBC: Recent Labs  Lab 05/23/18 1435 05/23/18 1629 05/23/18 1721 05/23/18 2321 05/24/18 0346 05/25/18 0639  WBC 19.7*  --   --  12.1* 11.5* 11.8*  NEUTROABS 17.4*  --   --   --  10.8*  --   HGB 10.6* 10.5* 11.2* 11.7* 11.9* 11.5*  HCT 32.0* 31.0* 33.0* 36.7* 35.7* 35.0*  MCV 77.5*  --   --  81.2 77.4* 78.8  PLT 130*  --   --  118* 139* 161*   Basic Metabolic Panel: Recent Labs  Lab 05/23/18 1435 05/23/18 1452 05/23/18 1629 05/23/18 1656 05/23/18 1721 05/23/18 2321 05/24/18 0346 05/25/18 0639  NA 140  --  139 139 139  --  138 140  K 2.8*  --  3.2* 3.1* 2.8*  --  3.7 3.6  CL 113*  --  111 109 104  --  111 116*  CO2 17*  --   --  20*  --   --  19* 19*  GLUCOSE 55*  --  47* 59* 59*  --  75 68  BUN 8  --  _0 --  7 7  CREATININE 0.92  --  0.60* 1.07 0.80 0.87 0.97 0.82  CALCIUM 6.1*  --   --  7.0*  --   --  7.2* 7.1*  MG  --  1.4*  --   --   --  1.4*  --   --    GFR: Estimated Creatinine Clearance: 85.9 mL/min (by C-G formula based on SCr of 0.82 mg/dL). Liver Function Tests: Recent Labs  Lab 05/23/18 1435 05/24/18 0346  AST 108* 115*  ALT 33 40  ALKPHOS 458* 515*  BILITOT 1.1 1.2  PROT 3.6* 4.6*  ALBUMIN 1.3* 1.6*   Recent Labs  Lab 05/23/18 1452  LIPASE 23   No results for input(s): AMMONIA in the last 168 hours. Coagulation Profile: Recent Labs  Lab 05/23/18 1435  INR 1.73   Cardiac Enzymes: Recent Labs  Lab 05/24/18 0852 05/24/18 1146 05/24/18 1528  TROPONINI 0.05* 0.03* 0.18*   BNP (last 3 results) No results for input(s): PROBNP in the last 8760 hours. HbA1C: No results  for input(s): HGBA1C in the last 72 hours. CBG: Recent Labs  Lab 05/24/18 2005 05/25/18 0006 05/25/18 0456 05/25/18 0746 05/25/18 1145  GLUCAP 73 66 61* 92 127*   Lipid Profile: No results for input(s): CHOL, HDL, LDLCALC, TRIG, CHOLHDL, LDLDIRECT in the last 72 hours. Thyroid Function Tests: No results for input(s): TSH, T4TOTAL, FREET4, T3FREE, THYROIDAB in the last 72 hours. Anemia Panel: No results for input(s): VITAMINB12, FOLATE, FERRITIN, TIBC, IRON, RETICCTPCT in the last 72 hours. Urine analysis:    Component Value Date/Time   COLORURINE YELLOW 05/24/2018 0137   APPEARANCEUR CLEAR 05/24/2018 0137   LABSPEC 1.035 (H) 05/24/2018 0137   PHURINE 5.0 05/24/2018 0137   GLUCOSEU NEGATIVE 05/24/2018 0137   HGBUR NEGATIVE 05/24/2018 0137   BILIRUBINUR NEGATIVE 05/24/2018 0137   KETONESUR NEGATIVE 05/24/2018 0137   PROTEINUR NEGATIVE 05/24/2018 0137   UROBILINOGEN 0.2 02/04/2010 1718   NITRITE NEGATIVE 05/24/2018 0137   LEUKOCYTESUR NEGATIVE 05/24/2018 0137     Gwenlyn Hottinger M.D. [facility] 05/25/2018, 12:37 PM  Pager: [phone] Between 7am to 7pm - call Pager - [phone]  After 7pm go to [email or web address] - password [email or web address]  Call night coverage person covering after 7pm

## 2018-05-25 NOTE — Progress Notes (Signed)
Patient has refused to eat all day. I have given him his boost supplement this am. Patient is resting right now and stated he doesn't was his dinner right now. Will continue to monitor.  Farley Ly RN

## 2018-05-25 NOTE — Progress Notes (Signed)
[facility] for Infectious Disease    Date of Admission:  05/23/2018   Total days of antibiotics 22        Day 22 RIPE        Day 3 piptazo           ID: Jeffrey Hayes is a 67 y.o. male with  Disseminated mTb admitted for n/v/diarrhea worsening cough. 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 Nausea is tolerable  Medications:  . enoxaparin (LOVENOX) injection  40 mg Subcutaneous Q24H  . ethambutol  1,200 mg Oral Daily  . feeding supplement  1 Container Oral TID BM  . isoniazid  300 mg Oral Daily  . metoCLOPramide (REGLAN) injection  10 mg Intravenous Q6H  . multivitamin with minerals  1 tablet Oral Daily  . ondansetron (ZOFRAN) IV  4 mg Intravenous Q8H  . pyrazinamide  1,500 mg Oral Daily  . pyridOXINE  50 mg Oral Daily  . rifampin  600 mg Oral Daily    Objective: Vital signs in last 24 hours: Temp:  [98.7 F (37.1 C)-99.5 F (37.5 C)] 98.7 F (37.1 C) (06/20 1653) Pulse Rate:  [101-114] 101 (06/20 1653) Resp:  [16-20] 20 (06/20 1653) BP: (88-109)/(56-77) 97/56 (06/20 1653) SpO2:  [93 %-96 %] 93 % (06/20 1653) Physical Exam  Constitutional: He is oriented to person, place, and sleepy. He appears chronically ill and mal-nourished. No distress.  HENT:  Mouth/Throat: Oropharynx is clear and moist. No oropharyngeal exudate.  Cardiovascular: Normal rate, regular rhythm and normal heart sounds. Exam reveals no gallop and no friction rub.  No murmur heard.  Pulmonary/Chest: Effort normal and breath sounds normal. No respiratory distress. He has no wheezes.  Abdominal: Soft. Bowel sounds are normal. He exhibits no distension. There is no tenderness.  Lymphadenopathy:    He has no cervical adenopathy.  Neurological: He is alert and oriented to person, place, and time.  Skin: Skin is warm and dry. No rash noted. No erythema.  Psychiatric: He has a normal mood and affect. His behavior is normal.     Lab Results Recent Labs    05/24/18 0346 05/25/18 0639  WBC 11.5* 11.8*  HGB 11.9* 11.5*  HCT 35.7* 35.0*  NA 138 140  K 3.7 3.6  CL 111 116*  CO2 19* 19*  BUN 7 7  CREATININE 0.97 0.82   Liver Panel Recent Labs    05/23/18 1435 05/24/18 0346  PROT 3.6* 4.6*  ALBUMIN 1.3* 1.6*  AST 108* 115*  ALT 33 40  ALKPHOS 458* 515*  BILITOT 1.1 1.2  BILIDIR  --  0.6*  IBILI  --  0.6    Microbiology: 6/18 blood cx ngtd Studies/Results: No results found.   Assessment/Plan: Productive cough thought to be bacterial pneumonia with pulmonary mTB = will get sputum afb  And aerobic culture to see if smear negative  Diarrhea = can schedule immodium. And if not improved, would do lomotil  Nausea = will schedule anti-emetic before his next dose of RIPE.  Malnutrition = ensure exacerbates his diarrhea. Please ask nutritionist if there are any other supplementations he can do  Disseminated mTB =continue on RIPE plus vit b  6.  transaminitis = mildly improved  [facility] for Infectious Diseases Cell: [phone] Pager: [phone]  05/25/2018, 5:43 PM

## 2018-05-25 NOTE — Progress Notes (Signed)
  Echocardiogram 2D Echocardiogram has been performed.  Jeriyah Granlund T Eola Waldrep 05/25/2018, 10:11 AM

## 2018-05-26 LAB — BASIC METABOLIC PANEL
ANION GAP: 5 (ref 5–15)
BUN: 7 mg/dL (ref 6–20)
CALCIUM: 6.9 mg/dL — AB (ref 8.9–10.3)
CO2: 17 mmol/L — ABNORMAL LOW (ref 22–32)
Chloride: 115 mmol/L — ABNORMAL HIGH (ref 101–111)
Creatinine, Ser: 0.83 mg/dL (ref 0.61–1.24)
GFR calc Af Amer: >60 mL/min (ref >60)
GLUCOSE: 97 mg/dL (ref 65–99)
Potassium: 3.5 mmol/L (ref 3.5–5.1)
SODIUM: 137 mmol/L (ref 135–145)

## 2018-05-26 LAB — CBC
HEMATOCRIT: 31.2 % — AB (ref 39.0–52.0)
HEMOGLOBIN: 10.2 g/dL — AB (ref 13.0–17.0)
MCH: 25.5 pg — ABNORMAL LOW (ref 26.0–34.0)
MCHC: 32.7 g/dL (ref 30.0–36.0)
MCV: 78 fL (ref 78.0–100.0)
Platelets: 136 10*3/uL — ABNORMAL LOW (ref 150–400)
RBC: 4 MIL/uL — ABNORMAL LOW (ref 4.22–5.81)
RDW: 16.9 % — AB (ref 11.5–15.5)
WBC: 11.5 10*3/uL — AB (ref 4.0–10.5)

## 2018-05-26 LAB — HEPATIC FUNCTION PANEL
ALBUMIN: 1.3 g/dL — AB (ref 3.5–5.0)
ALT: 30 U/L (ref 17–63)
AST: 80 U/L — AB (ref 15–41)
Alkaline Phosphatase: 403 U/L — ABNORMAL HIGH (ref 38–126)
BILIRUBIN TOTAL: 1 mg/dL (ref 0.3–1.2)
Bilirubin, Direct: 0.6 mg/dL — ABNORMAL HIGH (ref 0.1–0.5)
Indirect Bilirubin: 0.4 mg/dL (ref 0.3–0.9)
TOTAL PROTEIN: 4.2 g/dL — AB (ref 6.5–8.1)

## 2018-05-26 LAB — GASTROINTESTINAL PANEL BY PCR, STOOL (REPLACES STOOL CULTURE)

## 2018-05-26 LAB — GLUCOSE, CAPILLARY
GLUCOSE-CAPILLARY: 102 mg/dL — AB (ref 65–99)
GLUCOSE-CAPILLARY: 99 mg/dL (ref 65–99)
GLUCOSE-CAPILLARY: 87 mg/dL (ref 65–99)
Glucose-Capillary: 105 mg/dL — ABNORMAL HIGH (ref 65–99)
Glucose-Capillary: 112 mg/dL — ABNORMAL HIGH (ref 65–99)
Glucose-Capillary: 82 mg/dL (ref 65–99)

## 2018-05-26 MED ORDER — LOPERAMIDE HCL 2 MG PO CAPS
2.0000 mg | ORAL_CAPSULE | Freq: Once | ORAL | Status: AC
  Administered 2018-05-26: 2 mg via ORAL
  Filled 2018-05-26: qty 1

## 2018-05-26 MED ORDER — SACCHAROMYCES BOULARDII 250 MG PO CAPS
250.0000 mg | ORAL_CAPSULE | Freq: Two times a day (BID) | ORAL | Status: DC
  Filled 2018-05-26: qty 1

## 2018-05-26 MED ORDER — DIPHENOXYLATE-ATROPINE 2.5-0.025 MG PO TABS
1.0000 | ORAL_TABLET | Freq: Four times a day (QID) | ORAL | Status: DC
  Administered 2018-05-26 – 2018-05-31 (×18): 1 via ORAL
  Filled 2018-05-26 (×19): qty 1

## 2018-05-26 NOTE — Progress Notes (Signed)
Patient refused to eat any of his dinner. Gave patient sprite and apple sauce with his medications. Will continue to monitor.   Farley Ly RN

## 2018-05-26 NOTE — Progress Notes (Signed)
Patient ID: Jeffrey Hayes, male   DOB: 01/12/1951, 67 y.o.   MRN: [id number]                                                                PROGRESS NOTE                                                                                                                                                                                                             Patient Demographics:    Jeffrey Hayes, is a 67 y.o. male, DOB - 1951-11-24, W[facility]:[id number]  Admit date - 05/23/2018   Admitting Physician Jeffrey Patience, MD  Outpatient Primary MD for the patient is Jeffrey Forward, MD  LOS - 3  Outpatient Specialists:     Chief Complaint  Patient presents with  . Chest Pain  . Fever  . Diarrhea       Brief Narrative    67 y.o.malewithrecently diagnosed pulmonary tuberculosis discharged home on antituberculosis medications presents to the ER with complaints of persistent nausea vomiting and diarrhea over the last few days. Also has been having hiccups. Has been feeling weak. Has not been able to eat anything because of the vomiting. Patient has been compliant with his anti-tuberculosis medications.  In ED, patient was febrile, tachycardia, lactic acidosis, hypokalemia, hypomagnesemia. CT chest abdomen and pelvis showed fused lung nodules and apical predominant lung consolidation, small right pleural effusion, scattered air-fluid levels in the colon and rectum suggesting viral illness, no bowel obstruction.        Subjective:    Jeffrey Hayes today still has c/o diarrhea about 7 times yesterday. C. Diff negative, GI pathogen panel pending.  Denies fever, chills, cp, cough, sob, n/v, abd pain, constipation, brbpr,  Pt has poor appetite.    Assessment  & Plan :    Principal Problem:   Sepsis ([place]) Active Problems:   Diabetes mellitus without complication ([place])   Sarcoidosis   Hypertension   Thrombocytopenia ([facility])   Hyponatremia   Pulmonary tuberculosis   Diarrhea   Pressure injury  of skin   Sepsis ([place]), bilateral cavitary pneumonia, active tuberculosis, in the setting of sarcoidosis -ID consulted, followed by Jeffrey Hayes, recommended to continue [place] anti TB meds with vitamin B6  Active Problems: Nausea, vomiting, diarrhea -Possibly due to medications,  CT abdomen showed no obstruction or acute abdominal pathology -C. difficile negative -Follow GI pathogen panel, results  pending -Placed on scheduled Zofran however still having vomiting, DC Zofran, added scheduled Reglan, continue as needed Phenergan -Patient will benefit more if he receives antiemetics prior to the anti-TB meds. Add florastor today 6/21  Transaminitis with obstructive pattern -Possibly due to active TB and medications Patient had-right upper quadrant ultrasound on 05/09/2018 which showed no gallstones however markedly thickened gallbladder wall could be related to liver disease or cholecystitis. -CT abdomen pelvis on admission showed no gallstones, gallbladder wall thickening or biliary dilatation, no focal liver abnormality. 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  -Patient underwent bone marrow biopsy during the previous admission which showed normocellular marrow with numerous granulomata  Type 2 diabetes mellitus -Continue sliding scale insulin -Hemoglobin A1c 11.8 -Amaryl and metformin were discontinued during the previous admission  Essential hypertension -BP currently stable  hypokalemia, hypomagnesemia -Stable  Severe protein calorie malnutrition with hypoalbuminemia -Nutrition consulted, albumin 1.6   Mildly elevated troponin -Likely due to #1, no chest pain, follow 2D echo results   Code Status: Full CODE STATUS DVT Prophylaxis:  Lovenox Family Communication: Discussed in detail with the patient, all imaging results, lab results explained to the patient        Lab  Results  Component Value Date   PLT 138 (L) 05/25/2018      Anti-infectives (From admission, onward)   Start     Dose/Rate Route Frequency Ordered Stop   05/24/18 1000  ethambutol (MYAMBUTOL) tablet 1,200 mg     1,200 mg Oral Daily 05/23/18 2104     05/24/18 1000  isoniazid (NYDRAZID) tablet 300 mg     300 mg Oral Daily 05/23/18 2104     05/24/18 1000  pyrazinamide tablet 1,500 mg     1,500 mg Oral Daily 05/23/18 2104     05/24/18 1000  rifampin (RIFADIN) capsule 600 mg     600 mg Oral Daily 05/23/18 2104     05/23/18 2330  piperacillin-tazobactam (ZOSYN) IVPB 3.375 g  Status:  Discontinued     3.375 g 12.5 mL/hr over 240 Minutes Intravenous Every 8 hours 05/23/18 1529 05/25/18 1237   05/23/18 2330  vancomycin (VANCOCIN) IVPB 750 mg/150 ml premix  Status:  Discontinued     750 mg 150 mL/hr over 60 Minutes Intravenous Every 8 hours 05/23/18 1530 05/23/18 2104   05/23/18 1530  piperacillin-tazobactam (ZOSYN) IVPB 3.375 g     3.375 g 100 mL/hr over 30 Minutes Intravenous  Once 05/23/18 1515 05/23/18 1625   05/23/18 1530  vancomycin (VANCOCIN) IVPB 1000 mg/200 mL premix  Status:  Discontinued     1,000 mg 200 mL/hr over 60 Minutes Intravenous  Once 05/23/18 1515 05/23/18 1528   05/23/18 1530  vancomycin (VANCOCIN) 1,500 mg in sodium chloride 0.9 % 500 mL IVPB     1,500 mg 250 mL/hr over 120 Minutes Intravenous  Once 05/23/18 1528 05/23/18 1807        Objective:   Vitals:   05/25/18 2330 05/26/18 0020 05/26/18 0130 05/26/18 0439  BP:    99/66  Pulse:  (!) 124 (!) 108 (!) 101  Resp:    16  Temp: (!) 101 F (38.3 C) (!) 102.6 F (39.2 C) 98.6 F (37 C) 99.4 F (37.4 C)  TempSrc: Oral Oral Oral Oral  SpO2:  92% 94% 96%  Weight:  Height:        Wt Readings from Last 3 Encounters:  05/24/18 68.5 kg (151 lb)  05/14/18 68.7 kg (151 lb 7.3 oz)  03/11/18 75.3 kg (166 lb)     Intake/Output Summary (Last 24 hours) at 05/26/2018 0716 Last data filed at 05/26/2018  0439 Gross per 24 hour  Intake 80.14 ml  Output 900 ml  Net -819.86 ml     Physical Exam  Awake Alert, Oriented X 3, No new F.N deficits, Normal affect [place].AT,PERRAL Supple Neck,No JVD, No cervical lymphadenopathy appriciated.  Symmetrical Chest wall movement, Hayes air movement bilaterally, CTAB RRR,No Gallops,Rubs or new Murmurs, No Parasternal Heave +ve B.Sounds, Abd Soft, No tenderness, No organomegaly appriciated, No rebound - guarding or rigidity. No Cyanosis, Clubbing or edema, No new Rash or bruise     Data Review:    CBC Recent Labs  Lab 05/23/18 1435 05/23/18 1629 05/23/18 1721 05/23/18 2321 05/24/18 0346 05/25/18 0639  WBC 19.7*  --   --  12.1* 11.5* 11.8*  HGB 10.6* 10.5* 11.2* 11.7* 11.9* 11.5*  HCT 32.0* 31.0* 33.0* 36.7* 35.7* 35.0*  PLT 130*  --   --  118* 139* 138*  MCV 77.5*  --   --  81.2 77.4* 78.8  MCH 25.7*  --   --  25.9* 25.8* 25.9*  MCHC 33.1  --   --  31.9 33.3 32.9  RDW 16.8*  --   --  17.4* 16.9* 17.2*  LYMPHSABS 0.8  --   --   --  0.5*  --   MONOABS 1.3*  --   --   --  0.1  --   EOSABS 0.0  --   --   --  0.0  --   BASOSABS 0.1  --   --   --  0.1  --     Chemistries  Recent Labs  Lab 05/23/18 1435 05/23/18 1452 05/23/18 1629 05/23/18 1656 05/23/18 1721 05/23/18 2321 05/24/18 0346 05/25/18 0639  NA 140  --  139 139 139  --  138 140  K 2.8*  --  3.2* 3.1* 2.8*  --  3.7 3.6  CL 113*  --  111 109 104  --  111 116*  CO2 17*  --   --  20*  --   --  19* 19*  GLUCOSE 55*  --  47* 59* 59*  --  75 68  BUN 8  --  _0 --  7 7  CREATININE 0.92  --  0.60* 1.07 0.80 0.87 0.97 0.82  CALCIUM 6.1*  --   --  7.0*  --   --  7.2* 7.1*  MG  --  1.4*  --   --   --  1.4*  --   --   AST 108*  --   --   --   --   --  115*  --   ALT 33  --   --   --   --   --  40  --   ALKPHOS 458*  --   --   --   --   --  515*  --   BILITOT 1.1  --   --   --   --   --  1.2  --     ------------------------------------------------------------------------------------------------------------------ No results for input(s): CHOL, HDL, LDLCALC, TRIG, CHOLHDL, LDLDIRECT in the last 72 hours.  Lab Results  Component Value Date   HGBA1C 11.8 (H)  05/12/2018   ------------------------------------------------------------------------------------------------------------------ No results for input(s): TSH, T4TOTAL, T3FREE, THYROIDAB in the last 72 hours.  Invalid input(s): FREET3 ------------------------------------------------------------------------------------------------------------------ No results for input(s): VITAMINB12, FOLATE, FERRITIN, TIBC, IRON, RETICCTPCT in the last 72 hours.  Coagulation profile Recent Labs  Lab 05/23/18 1435  INR 1.73    No results for input(s): DDIMER in the last 72 hours.  Cardiac Enzymes Recent Labs  Lab 05/24/18 0852 05/24/18 1146 05/24/18 1528  TROPONINI 0.05* 0.03* 0.18*   ------------------------------------------------------------------------------------------------------------------    Component Value Date/Time   BNP 95.3 05/24/2018 0852    Inpatient Medications  Scheduled Meds: . enoxaparin (LOVENOX) injection  40 mg Subcutaneous Q24H  . ethambutol  1,200 mg Oral Daily  . feeding supplement  1 Container Oral TID BM  . isoniazid  300 mg Oral Daily  . metoCLOPramide (REGLAN) injection  10 mg Intravenous Q6H  . multivitamin with minerals  1 tablet Oral Daily  . pyrazinamide  1,500 mg Oral Daily  . pyridOXINE  50 mg Oral Daily  . rifampin  600 mg Oral Daily   Continuous Infusions: . sodium chloride 10 mL/hr at 05/25/18 1311  . sodium chloride Stopped (05/24/18 1330)   PRN Meds:.acetaminophen **OR** acetaminophen, loperamide, promethazine  Micro Results Recent Results (from the past 240 hour(s))  Culture, blood (Routine x 2)     Status: None (Preliminary result)   Collection Time: 05/23/18  2:53 PM  Result  Value Ref Range Status   Specimen Description BLOOD RIGHT ANTECUBITAL  Final   Special Requests   Final    BOTTLES DRAWN AEROBIC AND ANAEROBIC Blood Culture adequate volume   Culture   Final    NO GROWTH 2 DAYS Performed at [facility], [place] [address] [place], [place] [address]    Report Status PENDING  Incomplete  Culture, blood (Routine x 2)     Status: None (Preliminary result)   Collection Time: 05/23/18  3:43 PM  Result Value Ref Range Status   Specimen Description BLOOD BLOOD LEFT FOREARM  Final   Special Requests   Final    BOTTLES DRAWN AEROBIC ONLY Blood Culture results may not be optimal due to an inadequate volume of blood received in culture bottles   Culture   Final    NO GROWTH 2 DAYS Performed at [facility], [place] [address], [place], [place] [address]    Report Status PENDING  Incomplete  C difficile quick scan w PCR reflex     Status: None   Collection Time: 05/23/18 11:00 PM  Result Value Ref Range Status   C Diff antigen NEGATIVE NEGATIVE Final   C Diff toxin NEGATIVE NEGATIVE Final   C Diff interpretation No C. difficile detected.  Final    Comment: Performed at [facility], [place] [address], [place], [place] [address]  Urine culture     Status: None   Collection Time: 05/24/18  1:37 AM  Result Value Ref Range Status   Specimen Description URINE, CLEAN CATCH  Final   Special Requests NONE  Final   Culture   Final    NO GROWTH Performed at [facility], [place] [address] [place], [place] [address]    Report Status 05/25/2018 FINAL  Final    Radiology Reports Ct Abdomen Pelvis Wo Contrast  Result Date: 05/23/2018 CLINICAL DATA:  Tuberculosis.  Worsening cough and fever. EXAM: CT CHEST, ABDOMEN AND PELVIS WITHOUT CONTRAST TECHNIQUE: Multidetector CT imaging of the chest, abdomen and pelvis was performed  following the standard protocol without IV contrast. COMPARISON:  Chest radiograph 05/23/2018. Chest CTA 05/02/2018. CT abdomen and  pelvis 05/05/2018. CONTRAST EXTRAVASATION CONSULTATION: Type of contrast:  Isovue 300 Site of extravasation: Left antecubital fossa Estimated volume of extravasation: 100 ml Area of extravasation scanned with CT? no PATIENT'S SIGNS AND SYMPTOMS Skin blistering/ulceration: no Decrease capillary refill: no Change in skin color: no Decreased motor function or severe tightness: no Decreased pulses distal to site of extravasation: no Altered sensation: no Increasing pain or signs of increased swelling during observation: no TREATMENT Observation period at site: Patient returned to the emergency department for continued observation. Orders placed for limb elevation and application of ice pack. Plastic surgery consulted? no DOCUMENTATION AND FOLLOW-UP Post extravasation orders completed? yes Patient's questions answered? yes Patient instructed to call [phone] or seek immediate medical care for new/progressive symptoms. FINDINGS: CT CHEST FINDINGS Cardiovascular: Thoracic aortic atherosclerosis without aneurysm. Normal heart size. Scattered coronary artery calcification. No significant pericardial effusion. Mediastinum/Nodes: AP window lymph nodes measure up to 12 mm in short axis, similar to the prior CT. Similar 1.4 cm short axis right hilar lymph node. Mild motion and streak artifact through the subcarinal region limits evaluation of the previously described lymph node in this location. Unremarkable thyroid. Possible mild esophageal wall thickening. Lungs/Pleura: Small right pleural effusion, new from the prior CT. No pneumothorax. Innumerable nodules are again seen diffusely throughout both lungs, greatest in the upper lungs and less in the bases. Bilateral upper lobe airspace consolidation is greatest in the apices, overall stable to slightly progressed since the prior CT. Small areas of cavitation within the biapical consolidation have not significantly progressed. A small amount of patchy subpleural  consolidation anteriorly in the right middle lobe is new from the prior CT, and patchy right middle lobe consolidation laterally has increased. There is mild dependent atelectasis in the right lower lobe. Musculoskeletal: Postsurgical changes at the left shoulder. Mild thoracic disc degeneration. No suspicious osseous lesion. CT ABDOMEN PELVIS FINDINGS The study is mildly motion degraded. Hepatobiliary: No focal liver abnormality is seen. No gallstones, gallbladder wall thickening, or biliary dilatation. Pancreas: Unremarkable. Spleen: Unremarkable. Adrenals/Urinary Tract: Unchanged right adrenal nodule. Unremarkable left adrenal gland. Unchanged 11 mm left lower pole renal hypodensity, likely a cyst. Areas of subtle hypoenhancement in both kidneys on the prior CT cannot be re-evaluated today due to lack of IV contrast. There is no hydronephrosis. Unremarkable bladder. Stomach/Bowel: The stomach is largely collapsed. Scattered fluid is present throughout the colon, and there is also a moderate-sized air-fluid level in the rectum. Fluid is also noted in nondilated small bowel loops. There is no evidence of bowel obstruction. The appendix is grossly unremarkable. Vascular/Lymphatic: Mild abdominal aortic atherosclerosis without aneurysm. No enlarged lymph nodes. Reproductive: Unremarkable prostate. Other: Trace ascites, decreased from prior. No loculated fluid collection. Persistent diffusely edematous appearance of the intra-abdominal fat. Unchanged 1.5 cm calcified mesenteric nodule in the left mid abdomen (series 5, image 76). Musculoskeletal: No suspicious osseous lesion. Lower lumbar disc degeneration including severe disc space narrowing and spurring at L4-5 and L5-S1 resulting in moderate to severe bilateral neural foraminal stenosis. IMPRESSION: 1. Diffuse lung nodules and apical predominant lung consolidation, overall stable to mildly progressed from 05/02/2018 and consistent with the patient's diagnosis of  tuberculosis. 2. Small right pleural effusion, new from 05/02/2018. 3. Unchanged mild mediastinal and right hilar lymphadenopathy. 4. Scattered air-fluid levels in the colon and rectum suggesting a diarrheal illness. No bowel obstruction. 5. Trace ascites, decreased from prior. 6.  Unchanged calcified mesenteric nodule. 7.  Aortic Atherosclerosis (ICD10-I70.0). 8. Contrast extravasation as documented above. Electronically Signed   By: Logan Bores M.D.   On: 05/23/2018 18:12   Dg Chest 2 View  Result Date: 05/02/2018 CLINICAL DATA:  67 year old male with cirrhosis. Weakness and loss of appetite. EXAM: CHEST - 2 VIEW COMPARISON:  Chest CT 08/25/2017, radiographs 06/25/2016. FINDINGS: Semi upright AP and lateral views of the chest. Diffuse reticulonodular pulmonary opacity with an upper lobe predominance, and some semi confluent areas of upper lobe involvement. No superimposed pneumothorax or pleural effusion. Mediastinal contours remain normal. Visualized tracheal air column is within normal limits. Chronic postoperative changes to the left scapula. No acute osseous abnormality identified. Negative visible bowel gas pattern. IMPRESSION: Diffuse pulmonary reticulonodular opacity with some mildly confluent areas in the bilateral upper lobes. No pleural effusion. The appearance is nonspecific. Favor acute viral/atypical respiratory infection. Characterization with Chest CT (noncontrast probably would suffice) may be valuable. Electronically Signed   By: Genevie Ann M.D.   On: 05/02/2018 16:30   Ct Chest Wo Contrast  Result Date: 05/23/2018 CLINICAL DATA:  Tuberculosis.  Worsening cough and fever. EXAM: CT CHEST, ABDOMEN AND PELVIS WITHOUT CONTRAST TECHNIQUE: Multidetector CT imaging of the chest, abdomen and pelvis was performed following the standard protocol without IV contrast. COMPARISON:  Chest radiograph 05/23/2018. Chest CTA 05/02/2018. CT abdomen and pelvis 05/05/2018. CONTRAST EXTRAVASATION CONSULTATION:  Type of contrast:  Isovue 300 Site of extravasation: Left antecubital fossa Estimated volume of extravasation: 100 ml Area of extravasation scanned with CT? no PATIENT'S SIGNS AND SYMPTOMS Skin blistering/ulceration: no Decrease capillary refill: no Change in skin color: no Decreased motor function or severe tightness: no Decreased pulses distal to site of extravasation: no Altered sensation: no Increasing pain or signs of increased swelling during observation: no TREATMENT Observation period at site: Patient returned to the emergency department for continued observation. Orders placed for limb elevation and application of ice pack. Plastic surgery consulted? no DOCUMENTATION AND FOLLOW-UP Post extravasation orders completed? yes Patient's questions answered? yes Patient instructed to call [phone] or seek immediate medical care for new/progressive symptoms. FINDINGS: CT CHEST FINDINGS Cardiovascular: Thoracic aortic atherosclerosis without aneurysm. Normal heart size. Scattered coronary artery calcification. No significant pericardial effusion. Mediastinum/Nodes: AP window lymph nodes measure up to 12 mm in short axis, similar to the prior CT. Similar 1.4 cm short axis right hilar lymph node. Mild motion and streak artifact through the subcarinal region limits evaluation of the previously described lymph node in this location. Unremarkable thyroid. Possible mild esophageal wall thickening. Lungs/Pleura: Small right pleural effusion, new from the prior CT. No pneumothorax. Innumerable nodules are again seen diffusely throughout both lungs, greatest in the upper lungs and less in the bases. Bilateral upper lobe airspace consolidation is greatest in the apices, overall stable to slightly progressed since the prior CT. Small areas of cavitation within the biapical consolidation have not significantly progressed. A small amount of patchy subpleural consolidation anteriorly in the right middle lobe is new from the  prior CT, and patchy right middle lobe consolidation laterally has increased. There is mild dependent atelectasis in the right lower lobe. Musculoskeletal: Postsurgical changes at the left shoulder. Mild thoracic disc degeneration. No suspicious osseous lesion. CT ABDOMEN PELVIS FINDINGS The study is mildly motion degraded. Hepatobiliary: No focal liver abnormality is seen. No gallstones, gallbladder wall thickening, or biliary dilatation. Pancreas: Unremarkable. Spleen: Unremarkable. Adrenals/Urinary Tract: Unchanged right adrenal nodule. Unremarkable left adrenal gland. Unchanged 11 mm left lower pole renal  hypodensity, likely a cyst. Areas of subtle hypoenhancement in both kidneys on the prior CT cannot be re-evaluated today due to lack of IV contrast. There is no hydronephrosis. Unremarkable bladder. Stomach/Bowel: The stomach is largely collapsed. Scattered fluid is present throughout the colon, and there is also a moderate-sized air-fluid level in the rectum. Fluid is also noted in nondilated small bowel loops. There is no evidence of bowel obstruction. The appendix is grossly unremarkable. Vascular/Lymphatic: Mild abdominal aortic atherosclerosis without aneurysm. No enlarged lymph nodes. Reproductive: Unremarkable prostate. Other: Trace ascites, decreased from prior. No loculated fluid collection. Persistent diffusely edematous appearance of the intra-abdominal fat. Unchanged 1.5 cm calcified mesenteric nodule in the left mid abdomen (series 5, image 76). Musculoskeletal: No suspicious osseous lesion. Lower lumbar disc degeneration including severe disc space narrowing and spurring at L4-5 and L5-S1 resulting in moderate to severe bilateral neural foraminal stenosis. IMPRESSION: 1. Diffuse lung nodules and apical predominant lung consolidation, overall stable to mildly progressed from 05/02/2018 and consistent with the patient's diagnosis of tuberculosis. 2. Small right pleural effusion, new from 05/02/2018.  3. Unchanged mild mediastinal and right hilar lymphadenopathy. 4. Scattered air-fluid levels in the colon and rectum suggesting a diarrheal illness. No bowel obstruction. 5. Trace ascites, decreased from prior. 6. Unchanged calcified mesenteric nodule. 7.  Aortic Atherosclerosis (ICD10-I70.0). 8. Contrast extravasation as documented above. Electronically Signed   By: Logan Bores M.D.   On: 05/23/2018 18:12   Ct Angio Chest Pe W And/or Wo Contrast  Result Date: 05/02/2018 CLINICAL DATA:  Three weeks of decreased appetite and generalized weakness. Shortness of breath with exertion. No fever. EXAM: CT ANGIOGRAPHY CHEST WITH CONTRAST TECHNIQUE: Multidetector CT imaging of the chest was performed using the standard protocol during bolus administration of intravenous contrast. Multiplanar CT image reconstructions and MIPs were obtained to evaluate the vascular anatomy. CONTRAST:  163m ISOVUE-370 IOPAMIDOL (ISOVUE-370) INJECTION 76% COMPARISON:  Chest x-ray May 02, 2018.  Chest CT September 02, 2017 FINDINGS: Cardiovascular: The thoracic aorta demonstrates atherosclerotic change with no aneurysm or dissection. Stairstep artifact is seen, particularly in the lung bases, limiting evaluation for pulmonary emboli. Taking the artifact into account, there is no convincing evidence of pulmonary emboli. Mediastinum/Nodes: The thyroid is normal. There is a prominent subcarinal node which measures 18 mm and is stable. Shotty nodes in the AP window are stable. There is a prominent right hilar node which was not well assessed on the previous unenhanced study. Lungs/Pleura: Central airways are normal. No pneumothorax. Bilateral pulmonary infiltrates consisting of both numerous small nodules and more confluent regions. The infiltrates are most prominent in the upper lobes with cavitary rounded regions as seen on coronal image 74 measuring up to 3.6 cm in the right upper lobe, coronal image 67 in the left apex measuring up to 1.9  cm, and coronal image 71 in the left upper lobe measuring up to 1.9 cm. The right middle lobe and lower lobes are involve as well but less severely. No other acute abnormalities identified within the lung parenchyma. Upper Abdomen: Thickening of the right adrenal gland is stable, likely hyperplasia. Increased attenuation in the fat of the omentum within the left upper quadrant is new and nonspecific. Probable minimal ascites. Musculoskeletal: No chest wall abnormality. No acute or significant osseous findings. Review of the MIP images confirms the above findings. IMPRESSION: 1. Bilateral pulmonary infiltrates most prominent in the upper lobes with rounded regions of cavitation in the bilateral apices. Findings are most consistent with an infectious or inflammatory  process. Atypical infections such as tuberculosis should be considered. Non tuberculous mycobacterial infections and fungal infections are also possible. Malignancy is considered less likely. Inflammatory causes such as sarcoidosis could also present with upper lobe cavitating nodules. There are a few scattered prominent nodes in the mediastinum which are stable. There is a prominent right hilar node which was not well assessed on previous unenhanced imaging. 2. Atherosclerotic change in the thoracic aorta. 3. Evaluation for pulmonary emboli is limited due to artifact but no emboli are identified. Findings called to the patient's ER doctor, Dr. Maryan Rued. Aortic Atherosclerosis (ICD10-I70.0). Electronically Signed   By: Dorise Bullion III M.D   On: 05/02/2018 18:02   Ct Abdomen Pelvis W Contrast  Result Date: 05/05/2018 CLINICAL DATA:  Abdominal pain. Fever. Abscess suspected. Unintentional weight loss. EXAM: CT ABDOMEN AND PELVIS WITH CONTRAST TECHNIQUE: Multidetector CT imaging of the abdomen and pelvis was performed using the standard protocol following bolus administration of intravenous contrast. CONTRAST:  100 mL ISOVUE-300 IOPAMIDOL (ISOVUE-300)  INJECTION 61%, 129m OMNIPAQUE IOHEXOL 300 MG/ML SOLN COMPARISON:  01/11/2018 FINDINGS: Lower chest: Lung bases show numerous centrilobular nodular opacities, new since the prior exam. There is a small area patchy ground-glass opacity the posterolateral right middle lobe. Minimal right pleural effusion. There is dependent opacity in the posterior right lower lobe adjacent to the pleural effusion that is most likely atelectasis. Hepatobiliary: Liver normal in size and attenuation with no mass or focal lesion. There is gallbladder wall edema and pericholecystic fluid. No visible gallstones. No bile duct dilation. Pancreas: Unremarkable. No pancreatic ductal dilatation or surrounding inflammatory changes. Spleen: Normal in size without focal abnormality. Adrenals/Urinary Tract: 2.6 cm right adrenal mass, stable. Normal left adrenal gland. Subtle areas of relative hypoattenuation in both kidneys, most apparent in the lateral midpole of the left kidney. These may reflect areas bright is. There are new since prior exam stable 11 mm lower pole left renal mass consistent with a cyst. No other discrete renal masses. No stones. No hydronephrosis. Ureters are normal course and in caliber. Bladder is unremarkable. Stomach/Bowel: Stomach is unremarkable. No bowel dilation or wall thickening. No convincing inflammation. Normal appendix visualized. Vascular/Lymphatic: No pathologically enlarged lymph nodes. Aortic atherosclerosis. No aneurysm. Reproductive: Unremarkable. Other: Small amount of ascites, less than was present on the prior CT. There is no defined collection to suggest an abscess. Hazy opacities noted throughout the peritoneal and omental fat similar to the prior exam. Calcified peritoneal soft tissue mass in the left central abdomen adjacent to a loop of small bowel is stable from the prior study. Musculoskeletal: No fracture or acute finding. No osteoblastic or osteolytic lesions. IMPRESSION: 1. No evidence of an  abscess. 2. There are new bilateral centrilobular pulmonary nodules. Although the differential diagnosis is relatively broad, since these are new, infection with endobronchial spread such as from non tubercular mycobacteria or aspergillosis is suspected. Hypersensitivity pneumonitis is an alternative diagnosis. This may be the source of fever. 3. Ascites, decreased when compared the prior CT. 4. Areas of subtle relative decreased attenuation in both kidneys, which could be due to pyelonephritis. No evidence of a renal or perirenal abscess. 5. Gallbladder wall thickening that is most likely nonspecific edema related to ascites. 6. No evidence of bowel inflammation. 7. Stable calcified mesenteric nodule. Electronically Signed   By: DLajean ManesM.D.   On: 05/05/2018 07:20   Dg Chest Port 1 View  Result Date: 05/23/2018 CLINICAL DATA:  Shortness of breath and fever. EXAM: PORTABLE CHEST 1 VIEW  COMPARISON:  05/10/2018 and prior chest radiographs FINDINGS: Cardiomediastinal silhouette is unchanged. Bilateral airspace opacities, greatest in the UPPER lungs, have not significantly changed. No pleural effusion or pneumothorax. No acute bony abnormalities identified. IMPRESSION: Unchanged bilateral airspace opacities, UPPER lobe predominance. Electronically Signed   By: Margarette Canada M.D.   On: 05/23/2018 15:55   Dg Chest Port 1 View  Result Date: 05/10/2018 CLINICAL DATA:  Shortness of breath. EXAM: PORTABLE CHEST 1 VIEW COMPARISON:  Chest CT 05/02/2018 and chest radiograph 05/02/2018. FINDINGS: Cardiomediastinal silhouette is normal. Mediastinal contours appear intact. Low lung volumes. Worsening aeration of the lungs with increasing bilateral upper lobe predominant airspace consolidation, and cavitary spaces in the upper lobes. Osseous structures are without acute abnormality. Soft tissues are grossly normal. IMPRESSION: Worsening aeration of the lungs with increasing bilateral upper lobe predominant airspace  consolidation, and cavitary spaces in the upper lobes. Electronically Signed   By: Fidela Salisbury M.D.   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 He presents for bone marrow biopsy and culture. EXAM: CT GUIDED BONE MARROW ASPIRATION AND CORE BIOPSY Interventional Radiologist:  Criselda Peaches, MD MEDICATIONS: None. ANESTHESIA/SEDATION: Moderate (conscious) sedation was employed during this procedure. A total of 1.5 milligrams versed and 50 micrograms fentanyl were administered intravenously. The patient's level of consciousness and vital signs were monitored continuously by radiology nursing throughout the procedure under my direct supervision. Total monitored sedation time: 10 minutes FLUOROSCOPY TIME:  Fluoroscopy Time: 0 minutes 0 seconds (0 mGy). COMPLICATIONS: None immediate. Estimated blood loss: <25 mL PROCEDURE: Informed written consent was obtained from the patient after a thorough discussion of the procedural risks, benefits and alternatives. All questions were addressed. Maximal Sterile Barrier Technique was utilized including caps, mask, sterile gowns, sterile gloves, sterile drape, hand hygiene and skin antiseptic. A timeout was performed prior to the initiation of the procedure. The patient was positioned prone and non-contrast localization CT was performed of the pelvis to demonstrate the iliac marrow spaces. Maximal barrier sterile technique utilized including caps, mask, sterile gowns, sterile gloves, large sterile drape, hand hygiene, and betadine prep. Under sterile conditions and local anesthesia, an 11 gauge coaxial bone biopsy needle was advanced into the right iliac marrow space. Needle position was confirmed with CT imaging. Initially, bone marrow aspiration was performed. Next, the 11 gauge outer cannula was utilized to obtain a right iliac bone marrow core  biopsy. Needle was removed. Hemostasis was obtained with compression. The patient tolerated the procedure well. Samples were prepared with the cytotechnologist. IMPRESSION: Technically successful CT-guided bone marrow biopsy, aspiration and bone culture. Signed, Criselda Peaches, MD [facility] Electronically Signed   By: Jacqulynn Cadet M.D.   On: 05/10/2018 09:53   US Abdomen Limited Ruq  Result Date: 05/09/2018 CLINICAL DATA:  Abnormal LFTs EXAM: ULTRASOUND ABDOMEN LIMITED RIGHT UPPER QUADRANT COMPARISON:  CT 05/05/2018 FINDINGS: Gallbladder: Marked gallbladder wall thickening measuring up to 9 mm. No stones or sonographic Murphy's sign. Common bile duct: Diameter: Normal caliber, 4 mm Liver: No focal lesion identified. Within normal limits in parenchymal echogenicity. Portal vein is patent on color Doppler imaging with normal direction of blood flow towards the liver. Also noted is a right pleural effusion and ascites. IMPRESSION: Markedly thickened gallbladder wall. No gallstones visualized. This could be related to liver disease or cholecystitis. Right pleural effusion, mild perihepatic ascites. Electronically Signed  By: Rolm Baptise M.D.   On: 05/09/2018 08:31    Time Spent in minutes  30   Jani Gravel M.D on 05/26/2018 at 7:16 AM  Between 7am to 7pm - Pager - [phone]    After 7pm go to [email or web address] - password [facility]  [facility] -  Office  [phone]

## 2018-05-26 NOTE — Progress Notes (Signed)
    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 Has rectal tube in place. Some soreness with tubes  Medications:  . diphenoxylate-atropine  1 tablet Oral QID  . enoxaparin (LOVENOX) injection  40 mg Subcutaneous Q24H  . ethambutol  1,200 mg Oral Daily  . feeding supplement  1 Container Oral TID BM  . isoniazid  300 mg Oral Daily  . metoCLOPramide (REGLAN) injection  10 mg Intravenous Q6H  . multivitamin with minerals  1 tablet Oral Daily  . pyrazinamide  1,500 mg Oral Daily  . pyridOXINE  50 mg Oral Daily  . rifampin  600 mg Oral Daily    Objective: Vital signs in last 24 hours: Temp:  [98.6 F (37 C)-102.6 F (39.2 C)] 98.9 F (37.2 C) (06/21 0740) Pulse Rate:  [98-124] 98 (06/21 0740) Resp:  [16-20] 18 (06/21 0740) BP: (97-101)/(56-67) 101/67 (06/21 0740) SpO2:  [92 %-96 %] 96 % (06/21 0740) Physical Exam  Constitutional: He is oriented to person, place, and time. He appears chronically ill,cachetic and mal-nourished. No distress. hiccuping  HENT:  Mouth/Throat: Oropharynx is clear and moist. No oropharyngeal exudate.  Cardiovascular: Normal rate, regular rhythm and normal heart sounds. Exam reveals no gallop and no friction rub.  No murmur heard.  Pulmonary/Chest: Effort normal and breath sounds normal. No respiratory distress. He has no wheezes.  Abdominal: Soft. Bowel sounds are normal. He exhibits no distension. There is no tenderness. Rectal tube in place  gu = foley catheter Neurological: He is alert and oriented to person, place, and time.  Skin: Skin is warm and dry. No  rash noted. No erythema.  Psychiatric: He has a normal mood and affect. His behavior is normal.     Lab Results Recent Labs    05/25/18 0639 05/26/18 0740  WBC 11.8* 11.5*  HGB 11.5* 10.2*  HCT 35.0* 31.2*  NA 140 137  K 3.6 3.5  CL 116* 115*  CO2 19* 17*  BUN 7 7  CREATININE 0.82 0.83   Liver Panel Recent Labs    05/24/18 0346 05/26/18 0740  PROT 4.6* 4.2*  ALBUMIN 1.6* 1.3*  AST 115* 80*  ALT 40 30  ALKPHOS 515* 403*  BILITOT 1.2 1.0  BILIDIR 0.6* 0.6*  IBILI 0.6 0.4    Microbiology:  Studies/Results: No results found.   Assessment/Plan: MTB= continue with RIPE plus vitamin b6 with scheduled anti-emetic. Please repeat AFB sputum  Hiccups = recommend baclofen 5mg  TID instead of metoclopromide  Diarrhea = I have scheduled lomotil TID to see if it works.  [facility] for Infectious Diseases Cell: [phone] Pager: [phone]  05/26/2018, 4:29 PM

## 2018-05-27 ENCOUNTER — Inpatient Hospital Stay (HOSPITAL_COMMUNITY): Payer: Medicare Other

## 2018-05-27 DIAGNOSIS — A150 Tuberculosis of lung: Secondary | ICD-10-CM

## 2018-05-27 DIAGNOSIS — E162 Hypoglycemia, unspecified: Secondary | ICD-10-CM

## 2018-05-27 DIAGNOSIS — R197 Diarrhea, unspecified: Secondary | ICD-10-CM

## 2018-05-27 DIAGNOSIS — R066 Hiccough: Secondary | ICD-10-CM

## 2018-05-27 LAB — COMPREHENSIVE METABOLIC PANEL
ALT: 30 U/L (ref 17–63)
ANION GAP: 9 (ref 5–15)
AST: 82 U/L — ABNORMAL HIGH (ref 15–41)
Albumin: 1.4 g/dL — ABNORMAL LOW (ref 3.5–5.0)
Alkaline Phosphatase: 487 U/L — ABNORMAL HIGH (ref 38–126)
BUN: 6 mg/dL (ref 6–20)
CHLORIDE: 110 mmol/L (ref 101–111)
CO2: 17 mmol/L — AB (ref 22–32)
Calcium: 7 mg/dL — ABNORMAL LOW (ref 8.9–10.3)
Creatinine, Ser: 0.82 mg/dL (ref 0.61–1.24)
GFR calc Af Amer: >60 mL/min (ref >60)
GFR calc non Af Amer: >60 mL/min (ref >60)
Glucose, Bld: 119 mg/dL — ABNORMAL HIGH (ref 65–99)
Potassium: 3.8 mmol/L (ref 3.5–5.1)
SODIUM: 136 mmol/L (ref 135–145)
Total Bilirubin: 1.3 mg/dL — ABNORMAL HIGH (ref 0.3–1.2)
Total Protein: 4.8 g/dL — ABNORMAL LOW (ref 6.5–8.1)

## 2018-05-27 LAB — GLUCOSE, CAPILLARY
GLUCOSE-CAPILLARY: 136 mg/dL — AB (ref 65–99)
GLUCOSE-CAPILLARY: 91 mg/dL (ref 65–99)
Glucose-Capillary: 101 mg/dL — ABNORMAL HIGH (ref 65–99)
Glucose-Capillary: 108 mg/dL — ABNORMAL HIGH (ref 65–99)
Glucose-Capillary: 51 mg/dL — ABNORMAL LOW (ref 65–99)
Glucose-Capillary: 74 mg/dL (ref 65–99)
Glucose-Capillary: 87 mg/dL (ref 65–99)

## 2018-05-27 MED ORDER — ONDANSETRON HCL 4 MG/2ML IJ SOLN
4.0000 mg | Freq: Three times a day (TID) | INTRAMUSCULAR | Status: DC
  Administered 2018-05-27 – 2018-05-29 (×7): 4 mg via INTRAVENOUS
  Filled 2018-05-27 (×8): qty 2

## 2018-05-27 MED ORDER — DEXTROSE 50 % IV SOLN
INTRAVENOUS | Status: AC
  Administered 2018-05-27: 50 mL
  Filled 2018-05-27: qty 50

## 2018-05-27 MED ORDER — BACLOFEN 10 MG PO TABS
5.0000 mg | ORAL_TABLET | Freq: Three times a day (TID) | ORAL | Status: DC
  Administered 2018-05-27 – 2018-05-29 (×8): 5 mg via ORAL
  Administered 2018-05-29: 0.5 mg via ORAL
  Administered 2018-05-30 – 2018-06-05 (×20): 5 mg via ORAL
  Filled 2018-05-27 (×29): qty 1

## 2018-05-27 MED ORDER — DEXTROSE-NACL 5-0.9 % IV SOLN
INTRAVENOUS | Status: AC
  Administered 2018-05-27: 10:00:00 via INTRAVENOUS

## 2018-05-27 NOTE — Progress Notes (Signed)
[facility] for Infectious Disease   Reason for visit: Follow up on Tb  Interval History: still with poor po, though no hiccups since yesterday.  No nausea with medication for it.  Wife at bedside and trying to get him to eat.  Less diarrhea  Physical Exam: Constitutional:  Vitals:   05/27/18 0856 05/27/18 1207  BP: 98/77   Pulse: (!) 105   Resp: 18   Temp: 99.5 F (37.5 C) (!) 102.7 F (39.3 C)  SpO2: 96%    patient appears in NAD, fatigued Eyes: anicteric HENT: no thrush Respiratory: Normal respiratory effort; CTA B Cardiovascular: RRR GI: soft, nt, nd  Review of Systems: Constitutional: negative for fevers and chills Respiratory: negative for cough Musculoskeletal: negative for myalgias and arthralgias  Lab Results  Component Value Date   WBC 11.5 (H) 05/26/2018   HGB 10.2 (L) 05/26/2018   HCT 31.2 (L) 05/26/2018   MCV 78.0 05/26/2018   PLT 136 (L) 05/26/2018    Lab Results  Component Value Date   CREATININE 0.82 05/27/2018   BUN 6 05/27/2018   NA 136 05/27/2018   K 3.8 05/27/2018   CL 110 05/27/2018   CO2 17 (L) 05/27/2018    Lab Results  Component Value Date   ALT 30 05/27/2018   AST 82 (H) 05/27/2018   ALKPHOS 487 (H) 05/27/2018     Microbiology: Recent Results (from the past 240 hour(s))  Culture, blood (Routine x 2)     Status: None (Preliminary result)   Collection Time: 05/23/18  2:53 PM  Result Value Ref Range Status   Specimen Description BLOOD RIGHT ANTECUBITAL  Final   Special Requests   Final    BOTTLES DRAWN AEROBIC AND ANAEROBIC Blood Culture adequate volume   Culture   Final    NO GROWTH 3 DAYS Performed at [facility], [address], [place], [place] [address]    Report Status PENDING  Incomplete  Culture, blood (Routine x 2)     Status: None (Preliminary result)   Collection Time: 05/23/18  3:43 PM  Result Value Ref Range Status   Specimen Description BLOOD BLOOD LEFT FOREARM  Final   Special Requests   Final      BOTTLES DRAWN AEROBIC ONLY Blood Culture results may not be optimal due to an inadequate volume of blood received in culture bottles   Culture   Final    NO GROWTH 3 DAYS Performed at [facility], [place] [address], [place], [place] [address]    Report Status PENDING  Incomplete  Gastrointestinal Panel by PCR , Stool     Status: None   Collection Time: 05/23/18 10:45 PM  Result Value Ref Range Status   Campylobacter species NOT DETECTED NOT DETECTED Final   Plesimonas shigelloides NOT DETECTED NOT DETECTED Final   Salmonella species NOT DETECTED NOT DETECTED Final   Yersinia enterocolitica NOT DETECTED NOT DETECTED Final   Vibrio species NOT DETECTED NOT DETECTED Final   Vibrio cholerae NOT DETECTED NOT DETECTED Final   Enteroaggregative E coli (EAEC) NOT DETECTED NOT DETECTED Final   Enteropathogenic E coli (EPEC) NOT DETECTED NOT DETECTED Final   Enterotoxigenic E coli (ETEC) NOT DETECTED NOT DETECTED Final   Shiga like toxin producing E coli (STEC) NOT DETECTED NOT DETECTED Final   Shigella/Enteroinvasive E coli (EIEC) NOT DETECTED NOT DETECTED Final   Cryptosporidium NOT DETECTED NOT DETECTED Final   Cyclospora cayetanensis NOT DETECTED NOT DETECTED Final  Entamoeba histolytica NOT DETECTED NOT DETECTED Final   Giardia lamblia NOT DETECTED NOT DETECTED Final   Adenovirus F40/41 NOT DETECTED NOT DETECTED Final   Astrovirus NOT DETECTED NOT DETECTED Final   Norovirus GI/GII NOT DETECTED NOT DETECTED Final   Rotavirus A NOT DETECTED NOT DETECTED Final   Sapovirus (I, II, IV, and V) NOT DETECTED NOT DETECTED Final    Comment: Performed at [facility], [address] [place], [place] [address]  C difficile quick scan w PCR reflex     Status: None   Collection Time: 05/23/18 11:00 PM  Result Value Ref Range Status   C Diff antigen NEGATIVE NEGATIVE Final   C Diff toxin NEGATIVE NEGATIVE Final   C Diff interpretation No C. difficile detected.  Final    Comment:  Performed at [facility], [place] [address] [place], [place] [address]  Urine culture     Status: None   Collection Time: 05/24/18  1:37 AM  Result Value Ref Range Status   Specimen Description URINE, CLEAN CATCH  Final   Special Requests NONE  Final   Culture   Final    NO GROWTH Performed at [facility], [place] [address], [place], [place] [address]    Report Status 05/25/2018 FINAL  Final    Impression/Plan:  1. Pulmonary Tb - on RIPE and will continue.  2.  Hiccups - improved today.  Maybe from baclofen. Will continue.  3.  Diarrhea - some improvement, continue lomotil.

## 2018-05-27 NOTE — Progress Notes (Signed)
Paged Dr. Maudie Mercury to change x-ray from 2 V to 1 View portable due to airborne precautions and no negative pressure room.  Dr. Maudie Mercury returned page V/O OK to change chest X-ray to 1 View.

## 2018-05-27 NOTE — Progress Notes (Signed)
Patient ID: Jeffrey Hayes, male   DOB: [birth year], 67 y.o.   MRN: [id number]                                                                PROGRESS NOTE                                                                                                                                                                                                             Patient Demographics:    Jeffrey Hayes, is a 67 y.o. male, DOB - 1951/11/04, GGY:[id number]  Admit date - 05/23/2018   Admitting Physician Rise Patience, MD  Outpatient Primary MD for the patient is [name], MD  LOS - 4  Outpatient Specialists:     Chief Complaint  Patient presents with  . Chest Pain  . Fever  . Diarrhea       Brief Narrative     67 y.o.malewithrecently diagnosed pulmonary tuberculosis discharged home on antituberculosis medications presents to the ER with complaints of persistent nausea vomiting and diarrhea over the last few days. Also has been having hiccups. Has been feeling weak. Has not been able to eat anything because of the vomiting. Patient has been compliant with his anti-tuberculosis medications. In ED, patient was febrile, tachycardia, lactic acidosis, hypokalemia, hypomagnesemia. CT chest abdomen and pelvis showed fused lung nodules and apical predominant lung consolidation, small right pleural effusion, scattered air-fluid levels in the colon and rectum suggesting viral illness, no bowel obstruction.        Subjective:    Jeffrey Hayes today has less nausea.  Still with poor po intake.  Pt states has diarrhea but thinks its slowing down.  Some hiccups.  Denies fever, chills, cough, cp, palp, sob, abd pain, brbpr.    No headache, No new weakness tingling or numbness,    Assessment  & Plan :    Principal Problem:   Sepsis ([place]) Active Problems:   Diabetes mellitus without complication ([place])   Sarcoidosis   Hypertension   Thrombocytopenia ([facility])   Hyponatremia   Pulmonary  tuberculosis   Diarrhea   Pressure injury of skin    Sepsis ([place]), bilateral cavitary pneumonia, active tuberculosis, in the setting of sarcoidosis -ID consulted, followed by Dr. Graylon Good, recommended to continueRIPE anti TB medswith vitamin  B6 -repeat AFB as per ID recommendation  Active Problems: Nausea, vomiting, diarrhea -Possibly due to medications, CT abdomen showed no obstruction or acute abdominal pathology -C. Difficile 6/18=> negative -GI pathogen panel 6/18 =>negative DC imodium Agree with Lomotil per ID, 1 po qid Imodium 58m po q8h prn  Add Zofran 417miv tid ac meals Cont phenergan 12.45m52mv q6h prn  Added florastor 6/21  Hiccups DC reglan Start Baclofen 45mg73m tid  Transaminitis with obstructive pattern -Possibly due to active TB and medications Patient had-right upper quadrant ultrasound on 05/09/2018 which showed no gallstones however markedly thickened gallbladder wall could be related to liver disease or cholecystitis. -CT abdomen pelvis on admission showed no gallstones, gallbladder wall thickening or biliary dilatation, no focal liver abnormality. 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 -Patientunderwent bone marrow biopsy during the previous admission which showed normocellular marrow with numerous granulomata check cbc in am  Type 2 diabetes mellitus -Hemoglobin A1c 11.8 -Amaryl and metformin were discontinued during the previous admission -Hypoglycemia this am 6/22 , 1 amp D5 given -start on d5 Ns at 50mL7m hour x 12 hours -Try to encourage PO intake  Essential hypertension -BP currently stable  hypokalemia, hypomagnesemia -Stable Check cmp in am  Severe protein calorie malnutrition with hypoalbuminemia -Nutrition consulted, albumin 1.6  -continue resource breeze -Add prostat 30 ml po bid (6/22)  Mildly elevated troponin -Likely due to #1,    -Cardiac echo 6/20 EF 60-65%   Code Status :  FULL CODE  Family Communication  : w patient  Disposition Plan  : home when stable  Barriers For Discharge :   Consults  :  ID  Procedures  :   Cardiac echo 6/20  DVT Prophylaxis  :  Lovenox -  SCDs   Lab Results  Component Value Date   PLT 136 (L) 05/26/2018    Antibiotics  :  vanco 6/18, zosyn 6/18  Anti-infectives (From admission, onward)   Start     Dose/Rate Route Frequency Ordered Stop   05/24/18 1000  ethambutol (MYAMBUTOL) tablet 1,200 mg     1,200 mg Oral Daily 05/23/18 2104     05/24/18 1000  isoniazid (NYDRAZID) tablet 300 mg     300 mg Oral Daily 05/23/18 2104     05/24/18 1000  pyrazinamide tablet 1,500 mg     1,500 mg Oral Daily 05/23/18 2104     05/24/18 1000  rifampin (RIFADIN) capsule 600 mg     600 mg Oral Daily 05/23/18 2104     05/23/18 2330  piperacillin-tazobactam (ZOSYN) IVPB 3.375 g  Status:  Discontinued     3.375 g 12.5 mL/hr over 240 Minutes Intravenous Every 8 hours 05/23/18 1529 05/25/18 1237   05/23/18 2330  vancomycin (VANCOCIN) IVPB 750 mg/150 ml premix  Status:  Discontinued     750 mg 150 mL/hr over 60 Minutes Intravenous Every 8 hours 05/23/18 1530 05/23/18 2104   05/23/18 1530  piperacillin-tazobactam (ZOSYN) IVPB 3.375 g     3.375 g 100 mL/hr over 30 Minutes Intravenous  Once 05/23/18 1515 05/23/18 1625   05/23/18 1530  vancomycin (VANCOCIN) IVPB 1000 mg/200 mL premix  Status:  Discontinued     1,000 mg 200 mL/hr over 60 Minutes Intravenous  Once 05/23/18 1515 05/23/18 1528   05/23/18 1530  vancomycin (VANCOCIN) 1,500 mg in sodium chloride 0.9 % 500 mL IVPB     1,500 mg 250 mL/hr  over 120 Minutes Intravenous  Once 05/23/18 1528 05/23/18 1807        Objective:   Vitals:   05/26/18 1708 05/26/18 2005 05/27/18 0012 05/27/18 0344  BP: 103/65 95/66  107/71  Pulse: 100 (!) 105  100  Resp: 18 18  (!) 22  Temp:  (!) 102 F (38.9 C) 98.9 F (37.2 C) 98.4 F (36.9 C)  TempSrc:   Oral Oral Oral  SpO2: 98% 97%  95%  Weight:      Height:        Wt Readings from Last 3 Encounters:  05/24/18 68.5 kg (151 lb)  05/14/18 68.7 kg (151 lb 7.3 oz)  03/11/18 75.3 kg (166 lb)     Intake/Output Summary (Last 24 hours) at 05/27/2018 0819 Last data filed at 05/27/2018 0600 Gross per 24 hour  Intake 714.95 ml  Output 1000 ml  Net -285.05 ml     Physical Exam  Awake Alert, Oriented X 3, No new F.N deficits, Normal affect .AT,PERRAL Supple Neck,No JVD, No cervical lymphadenopathy appriciated.  Symmetrical Chest wall movement, Good air movement bilaterally, CTAB RRR,No Gallops,Rubs or new Murmurs, No Parasternal Heave +ve B.Sounds, Abd Soft, No tenderness, No organomegaly appriciated, No rebound - guarding or rigidity. No Cyanosis, Clubbing or edema, No new Rash or bruise       Data Review:    CBC Recent Labs  Lab 05/23/18 1435  05/23/18 1721 05/23/18 2321 05/24/18 0346 05/25/18 0639 05/26/18 0740  WBC 19.7*  --   --  12.1* 11.5* 11.8* 11.5*  HGB 10.6*   < > 11.2* 11.7* 11.9* 11.5* 10.2*  HCT 32.0*   < > 33.0* 36.7* 35.7* 35.0* 31.2*  PLT 130*  --   --  118* 139* 138* 136*  MCV 77.5*  --   --  81.2 77.4* 78.8 78.0  MCH 25.7*  --   --  25.9* 25.8* 25.9* 25.5*  MCHC 33.1  --   --  31.9 33.3 32.9 32.7  RDW 16.8*  --   --  17.4* 16.9* 17.2* 16.9*  LYMPHSABS 0.8  --   --   --  0.5*  --   --   MONOABS 1.3*  --   --   --  0.1  --   --   EOSABS 0.0  --   --   --  0.0  --   --   BASOSABS 0.1  --   --   --  0.1  --   --    < > = values in this interval not displayed.    Chemistries  Recent Labs  Lab 05/23/18 1435 05/23/18 1452  05/23/18 1656 05/23/18 1721 05/23/18 2321 05/24/18 0346 05/25/18 0639 05/26/18 0740  NA 140  --    < > 139 139  --  138 140 137  K 2.8*  --    < > 3.1* 2.8*  --  3.7 3.6 3.5  CL 113*  --    < > 109 104  --  111 116* 115*  CO2 17*  --   --  20*  --   --  19* 19* 17*  GLUCOSE 55*  --    < > 59* 59*  --  75 68 97  BUN 8  --     < > 9 10  --  _0 CREATININE 0.92  --    < > 1.07 0.80 0.87 0.97 0.82 0.83  CALCIUM 6.1*  --   --  7.0*  --   --  7.2* 7.1* 6.9*  MG  --  1.4*  --   --   --  1.4*  --   --   --   AST 108*  --   --   --   --   --  115*  --  80*  ALT 33  --   --   --   --   --  40  --  30  ALKPHOS 458*  --   --   --   --   --  515*  --  403*  BILITOT 1.1  --   --   --   --   --  1.2  --  1.0   < > = values in this interval not displayed.   ------------------------------------------------------------------------------------------------------------------ No results for input(s): CHOL, HDL, LDLCALC, TRIG, CHOLHDL, LDLDIRECT in the last 72 hours.  Lab Results  Component Value Date   HGBA1C 11.8 (H) 05/12/2018   ------------------------------------------------------------------------------------------------------------------ No results for input(s): TSH, T4TOTAL, T3FREE, THYROIDAB in the last 72 hours.  Invalid input(s): FREET3 ------------------------------------------------------------------------------------------------------------------ No results for input(s): VITAMINB12, FOLATE, FERRITIN, TIBC, IRON, RETICCTPCT in the last 72 hours.  Coagulation profile Recent Labs  Lab 05/23/18 1435  INR 1.73    No results for input(s): DDIMER in the last 72 hours.  Cardiac Enzymes Recent Labs  Lab 05/24/18 0852 05/24/18 1146 05/24/18 1528  TROPONINI 0.05* 0.03* 0.18*   ------------------------------------------------------------------------------------------------------------------    Component Value Date/Time   BNP 95.3 05/24/2018 0852    Inpatient Medications  Scheduled Meds: . diphenoxylate-atropine  1 tablet Oral QID  . enoxaparin (LOVENOX) injection  40 mg Subcutaneous Q24H  . ethambutol  1,200 mg Oral Daily  . feeding supplement  1 Container Oral TID BM  . isoniazid  300 mg Oral Daily  . metoCLOPramide (REGLAN) injection  10 mg Intravenous Q6H  . multivitamin with minerals  1  tablet Oral Daily  . pyrazinamide  1,500 mg Oral Daily  . pyridOXINE  50 mg Oral Daily  . rifampin  600 mg Oral Daily   Continuous Infusions: . sodium chloride 10 mL/hr at 05/25/18 1311  . sodium chloride 10 mL/hr at 05/27/18 0600   PRN Meds:.acetaminophen **OR** acetaminophen, loperamide, promethazine  Micro Results Recent Results (from the past 240 hour(s))  Culture, blood (Routine x 2)     Status: None (Preliminary result)   Collection Time: 05/23/18  2:53 PM  Result Value Ref Range Status   Specimen Description BLOOD RIGHT ANTECUBITAL  Final   Special Requests   Final    BOTTLES DRAWN AEROBIC AND ANAEROBIC Blood Culture adequate volume   Culture   Final    NO GROWTH 3 DAYS Performed at [facility], [place] [address], [place], [place] [address]    Report Status PENDING  Incomplete  Culture, blood (Routine x 2)     Status: None (Preliminary result)   Collection Time: 05/23/18  3:43 PM  Result Value Ref Range Status   Specimen Description BLOOD BLOOD LEFT FOREARM  Final   Special Requests   Final    BOTTLES DRAWN AEROBIC ONLY Blood Culture results may not be optimal due to an inadequate volume of blood received in culture bottles   Culture   Final    NO GROWTH 3 DAYS Performed at [facility], [place] [address] [place], [place] [address]    Report Status PENDING  Incomplete  Gastrointestinal Panel by PCR , Stool  Status: None   Collection Time: 05/23/18 10:45 PM  Result Value Ref Range Status   Campylobacter species NOT DETECTED NOT DETECTED Final   Plesimonas shigelloides NOT DETECTED NOT DETECTED Final   Salmonella species NOT DETECTED NOT DETECTED Final   Yersinia enterocolitica NOT DETECTED NOT DETECTED Final   Vibrio species NOT DETECTED NOT DETECTED Final   Vibrio cholerae NOT DETECTED NOT DETECTED Final   Enteroaggregative E coli (EAEC) NOT DETECTED NOT DETECTED Final   Enteropathogenic E coli (EPEC) NOT DETECTED NOT DETECTED Final   Enterotoxigenic E  coli (ETEC) NOT DETECTED NOT DETECTED Final   Shiga like toxin producing E coli (STEC) NOT DETECTED NOT DETECTED Final   Shigella/Enteroinvasive E coli (EIEC) NOT DETECTED NOT DETECTED Final   Cryptosporidium NOT DETECTED NOT DETECTED Final   Cyclospora cayetanensis NOT DETECTED NOT DETECTED Final   Entamoeba histolytica NOT DETECTED NOT DETECTED Final   Giardia lamblia NOT DETECTED NOT DETECTED Final   Adenovirus F40/41 NOT DETECTED NOT DETECTED Final   Astrovirus NOT DETECTED NOT DETECTED Final   Norovirus GI/GII NOT DETECTED NOT DETECTED Final   Rotavirus A NOT DETECTED NOT DETECTED Final   Sapovirus (I, II, IV, and V) NOT DETECTED NOT DETECTED Final    Comment: Performed at [facility], [address] [place], [place] [address]  C difficile quick scan w PCR reflex     Status: None   Collection Time: 05/23/18 11:00 PM  Result Value Ref Range Status   C Diff antigen NEGATIVE NEGATIVE Final   C Diff toxin NEGATIVE NEGATIVE Final   C Diff interpretation No C. difficile detected.  Final    Comment: Performed at [facility], [place] [address], [place], [place] [address]  Urine culture     Status: None   Collection Time: 05/24/18  1:37 AM  Result Value Ref Range Status   Specimen Description URINE, CLEAN CATCH  Final   Special Requests NONE  Final   Culture   Final    NO GROWTH Performed at [facility], [place] [address], [place], [place] [address]    Report Status 05/25/2018 FINAL  Final    Radiology Reports Ct Abdomen Pelvis Wo Contrast  Result Date: 05/23/2018 CLINICAL DATA:  Tuberculosis.  Worsening cough and fever. EXAM: CT CHEST, ABDOMEN AND PELVIS WITHOUT CONTRAST TECHNIQUE: Multidetector CT imaging of the chest, abdomen and pelvis was performed following the standard protocol without IV contrast. COMPARISON:  Chest radiograph 05/23/2018. Chest CTA 05/02/2018. CT abdomen and pelvis 05/05/2018. CONTRAST EXTRAVASATION CONSULTATION: Type of contrast:  Isovue  300 Site of extravasation: Left antecubital fossa Estimated volume of extravasation: 100 ml Area of extravasation scanned with CT? no PATIENT'S SIGNS AND SYMPTOMS Skin blistering/ulceration: no Decrease capillary refill: no Change in skin color: no Decreased motor function or severe tightness: no Decreased pulses distal to site of extravasation: no Altered sensation: no Increasing pain or signs of increased swelling during observation: no TREATMENT Observation period at site: Patient returned to the emergency department for continued observation. Orders placed for limb elevation and application of ice pack. Plastic surgery consulted? no DOCUMENTATION AND FOLLOW-UP Post extravasation orders completed? yes Patient's questions answered? yes Patient instructed to call [phone] or seek immediate medical care for new/progressive symptoms. FINDINGS: CT CHEST FINDINGS Cardiovascular: Thoracic aortic atherosclerosis without aneurysm. Normal heart size. Scattered coronary artery calcification. No significant pericardial effusion. Mediastinum/Nodes: AP window lymph nodes measure up to 12 mm in short axis, similar to the prior CT. Similar 1.4  cm short axis right hilar lymph node. Mild motion and streak artifact through the subcarinal region limits evaluation of the previously described lymph node in this location. Unremarkable thyroid. Possible mild esophageal wall thickening. Lungs/Pleura: Small right pleural effusion, new from the prior CT. No pneumothorax. Innumerable nodules are again seen diffusely throughout both lungs, greatest in the upper lungs and less in the bases. Bilateral upper lobe airspace consolidation is greatest in the apices, overall stable to slightly progressed since the prior CT. Small areas of cavitation within the biapical consolidation have not significantly progressed. A small amount of patchy subpleural consolidation anteriorly in the right middle lobe is new from the prior CT, and patchy right  middle lobe consolidation laterally has increased. There is mild dependent atelectasis in the right lower lobe. Musculoskeletal: Postsurgical changes at the left shoulder. Mild thoracic disc degeneration. No suspicious osseous lesion. CT ABDOMEN PELVIS FINDINGS The study is mildly motion degraded. Hepatobiliary: No focal liver abnormality is seen. No gallstones, gallbladder wall thickening, or biliary dilatation. Pancreas: Unremarkable. Spleen: Unremarkable. Adrenals/Urinary Tract: Unchanged right adrenal nodule. Unremarkable left adrenal gland. Unchanged 11 mm left lower pole renal hypodensity, likely a cyst. Areas of subtle hypoenhancement in both kidneys on the prior CT cannot be re-evaluated today due to lack of IV contrast. There is no hydronephrosis. Unremarkable bladder. Stomach/Bowel: The stomach is largely collapsed. Scattered fluid is present throughout the colon, and there is also a moderate-sized air-fluid level in the rectum. Fluid is also noted in nondilated small bowel loops. There is no evidence of bowel obstruction. The appendix is grossly unremarkable. Vascular/Lymphatic: Mild abdominal aortic atherosclerosis without aneurysm. No enlarged lymph nodes. Reproductive: Unremarkable prostate. Other: Trace ascites, decreased from prior. No loculated fluid collection. Persistent diffusely edematous appearance of the intra-abdominal fat. Unchanged 1.5 cm calcified mesenteric nodule in the left mid abdomen (series 5, image 76). Musculoskeletal: No suspicious osseous lesion. Lower lumbar disc degeneration including severe disc space narrowing and spurring at L4-5 and L5-S1 resulting in moderate to severe bilateral neural foraminal stenosis. IMPRESSION: 1. Diffuse lung nodules and apical predominant lung consolidation, overall stable to mildly progressed from 05/02/2018 and consistent with the patient's diagnosis of tuberculosis. 2. Small right pleural effusion, new from 05/02/2018. 3. Unchanged mild  mediastinal and right hilar lymphadenopathy. 4. Scattered air-fluid levels in the colon and rectum suggesting a diarrheal illness. No bowel obstruction. 5. Trace ascites, decreased from prior. 6. Unchanged calcified mesenteric nodule. 7.  Aortic Atherosclerosis (ICD10-I70.0). 8. Contrast extravasation as documented above. Electronically Signed   By: Logan Bores M.D.   On: 05/23/2018 18:12   Dg Chest 2 View  Result Date: 05/02/2018 CLINICAL DATA:  67 year old male with cirrhosis. Weakness and loss of appetite. EXAM: CHEST - 2 VIEW COMPARISON:  Chest CT 08/25/2017, radiographs 06/25/2016. FINDINGS: Semi upright AP and lateral views of the chest. Diffuse reticulonodular pulmonary opacity with an upper lobe predominance, and some semi confluent areas of upper lobe involvement. No superimposed pneumothorax or pleural effusion. Mediastinal contours remain normal. Visualized tracheal air column is within normal limits. Chronic postoperative changes to the left scapula. No acute osseous abnormality identified. Negative visible bowel gas pattern. IMPRESSION: Diffuse pulmonary reticulonodular opacity with some mildly confluent areas in the bilateral upper lobes. No pleural effusion. The appearance is nonspecific. Favor acute viral/atypical respiratory infection. Characterization with Chest CT (noncontrast probably would suffice) may be valuable. Electronically Signed   By: Genevie Ann M.D.   On: 05/02/2018 16:30   Ct Chest Wo Contrast  Result Date:  05/23/2018 CLINICAL DATA:  Tuberculosis.  Worsening cough and fever. EXAM: CT CHEST, ABDOMEN AND PELVIS WITHOUT CONTRAST TECHNIQUE: Multidetector CT imaging of the chest, abdomen and pelvis was performed following the standard protocol without IV contrast. COMPARISON:  Chest radiograph 05/23/2018. Chest CTA 05/02/2018. CT abdomen and pelvis 05/05/2018. CONTRAST EXTRAVASATION CONSULTATION: Type of contrast:  Isovue 300 Site of extravasation: Left antecubital fossa Estimated  volume of extravasation: 100 ml Area of extravasation scanned with CT? no PATIENT'S SIGNS AND SYMPTOMS Skin blistering/ulceration: no Decrease capillary refill: no Change in skin color: no Decreased motor function or severe tightness: no Decreased pulses distal to site of extravasation: no Altered sensation: no Increasing pain or signs of increased swelling during observation: no TREATMENT Observation period at site: Patient returned to the emergency department for continued observation. Orders placed for limb elevation and application of ice pack. Plastic surgery consulted? no DOCUMENTATION AND FOLLOW-UP Post extravasation orders completed? yes Patient's questions answered? yes Patient instructed to call [phone] or seek immediate medical care for new/progressive symptoms. FINDINGS: CT CHEST FINDINGS Cardiovascular: Thoracic aortic atherosclerosis without aneurysm. Normal heart size. Scattered coronary artery calcification. No significant pericardial effusion. Mediastinum/Nodes: AP window lymph nodes measure up to 12 mm in short axis, similar to the prior CT. Similar 1.4 cm short axis right hilar lymph node. Mild motion and streak artifact through the subcarinal region limits evaluation of the previously described lymph node in this location. Unremarkable thyroid. Possible mild esophageal wall thickening. Lungs/Pleura: Small right pleural effusion, new from the prior CT. No pneumothorax. Innumerable nodules are again seen diffusely throughout both lungs, greatest in the upper lungs and less in the bases. Bilateral upper lobe airspace consolidation is greatest in the apices, overall stable to slightly progressed since the prior CT. Small areas of cavitation within the biapical consolidation have not significantly progressed. A small amount of patchy subpleural consolidation anteriorly in the right middle lobe is new from the prior CT, and patchy right middle lobe consolidation laterally has increased. There is  mild dependent atelectasis in the right lower lobe. Musculoskeletal: Postsurgical changes at the left shoulder. Mild thoracic disc degeneration. No suspicious osseous lesion. CT ABDOMEN PELVIS FINDINGS The study is mildly motion degraded. Hepatobiliary: No focal liver abnormality is seen. No gallstones, gallbladder wall thickening, or biliary dilatation. Pancreas: Unremarkable. Spleen: Unremarkable. Adrenals/Urinary Tract: Unchanged right adrenal nodule. Unremarkable left adrenal gland. Unchanged 11 mm left lower pole renal hypodensity, likely a cyst. Areas of subtle hypoenhancement in both kidneys on the prior CT cannot be re-evaluated today due to lack of IV contrast. There is no hydronephrosis. Unremarkable bladder. Stomach/Bowel: The stomach is largely collapsed. Scattered fluid is present throughout the colon, and there is also a moderate-sized air-fluid level in the rectum. Fluid is also noted in nondilated small bowel loops. There is no evidence of bowel obstruction. The appendix is grossly unremarkable. Vascular/Lymphatic: Mild abdominal aortic atherosclerosis without aneurysm. No enlarged lymph nodes. Reproductive: Unremarkable prostate. Other: Trace ascites, decreased from prior. No loculated fluid collection. Persistent diffusely edematous appearance of the intra-abdominal fat. Unchanged 1.5 cm calcified mesenteric nodule in the left mid abdomen (series 5, image 76). Musculoskeletal: No suspicious osseous lesion. Lower lumbar disc degeneration including severe disc space narrowing and spurring at L4-5 and L5-S1 resulting in moderate to severe bilateral neural foraminal stenosis. IMPRESSION: 1. Diffuse lung nodules and apical predominant lung consolidation, overall stable to mildly progressed from 05/02/2018 and consistent with the patient's diagnosis of tuberculosis. 2. Small right pleural effusion, new from 05/02/2018. 3. Unchanged  mild mediastinal and right hilar lymphadenopathy. 4. Scattered air-fluid  levels in the colon and rectum suggesting a diarrheal illness. No bowel obstruction. 5. Trace ascites, decreased from prior. 6. Unchanged calcified mesenteric nodule. 7.  Aortic Atherosclerosis (ICD10-I70.0). 8. Contrast extravasation as documented above. Electronically Signed   By: Logan Bores M.D.   On: 05/23/2018 18:12   Ct Angio Chest Pe W And/or Wo Contrast  Result Date: 05/02/2018 CLINICAL DATA:  Three weeks of decreased appetite and generalized weakness. Shortness of breath with exertion. No fever. EXAM: CT ANGIOGRAPHY CHEST WITH CONTRAST TECHNIQUE: Multidetector CT imaging of the chest was performed using the standard protocol during bolus administration of intravenous contrast. Multiplanar CT image reconstructions and MIPs were obtained to evaluate the vascular anatomy. CONTRAST:  123m ISOVUE-370 IOPAMIDOL (ISOVUE-370) INJECTION 76% COMPARISON:  Chest x-ray May 02, 2018.  Chest CT September 02, 2017 FINDINGS: Cardiovascular: The thoracic aorta demonstrates atherosclerotic change with no aneurysm or dissection. Stairstep artifact is seen, particularly in the lung bases, limiting evaluation for pulmonary emboli. Taking the artifact into account, there is no convincing evidence of pulmonary emboli. Mediastinum/Nodes: The thyroid is normal. There is a prominent subcarinal node which measures 18 mm and is stable. Shotty nodes in the AP window are stable. There is a prominent right hilar node which was not well assessed on the previous unenhanced study. Lungs/Pleura: Central airways are normal. No pneumothorax. Bilateral pulmonary infiltrates consisting of both numerous small nodules and more confluent regions. The infiltrates are most prominent in the upper lobes with cavitary rounded regions as seen on coronal image 74 measuring up to 3.6 cm in the right upper lobe, coronal image 67 in the left apex measuring up to 1.9 cm, and coronal image 71 in the left upper lobe measuring up to 1.9 cm. The right  middle lobe and lower lobes are involve as well but less severely. No other acute abnormalities identified within the lung parenchyma. Upper Abdomen: Thickening of the right adrenal gland is stable, likely hyperplasia. Increased attenuation in the fat of the omentum within the left upper quadrant is new and nonspecific. Probable minimal ascites. Musculoskeletal: No chest wall abnormality. No acute or significant osseous findings. Review of the MIP images confirms the above findings. IMPRESSION: 1. Bilateral pulmonary infiltrates most prominent in the upper lobes with rounded regions of cavitation in the bilateral apices. Findings are most consistent with an infectious or inflammatory process. Atypical infections such as tuberculosis should be considered. Non tuberculous mycobacterial infections and fungal infections are also possible. Malignancy is considered less likely. Inflammatory causes such as sarcoidosis could also present with upper lobe cavitating nodules. There are a few scattered prominent nodes in the mediastinum which are stable. There is a prominent right hilar node which was not well assessed on previous unenhanced imaging. 2. Atherosclerotic change in the thoracic aorta. 3. Evaluation for pulmonary emboli is limited due to artifact but no emboli are identified. Findings called to the patient's ER doctor, Dr. PMaryan Rued Aortic Atherosclerosis (ICD10-I70.0). Electronically Signed   By: DDorise BullionIII M.D   On: 05/02/2018 18:02   Ct Abdomen Pelvis W Contrast  Result Date: 05/05/2018 CLINICAL DATA:  Abdominal pain. Fever. Abscess suspected. Unintentional weight loss. EXAM: CT ABDOMEN AND PELVIS WITH CONTRAST TECHNIQUE: Multidetector CT imaging of the abdomen and pelvis was performed using the standard protocol following bolus administration of intravenous contrast. CONTRAST:  100 mL ISOVUE-300 IOPAMIDOL (ISOVUE-300) INJECTION 61%, 1011mOMNIPAQUE IOHEXOL 300 MG/ML SOLN COMPARISON:  01/11/2018  FINDINGS: Lower chest:  Lung bases show numerous centrilobular nodular opacities, new since the prior exam. There is a small area patchy ground-glass opacity the posterolateral right middle lobe. Minimal right pleural effusion. There is dependent opacity in the posterior right lower lobe adjacent to the pleural effusion that is most likely atelectasis. Hepatobiliary: Liver normal in size and attenuation with no mass or focal lesion. There is gallbladder wall edema and pericholecystic fluid. No visible gallstones. No bile duct dilation. Pancreas: Unremarkable. No pancreatic ductal dilatation or surrounding inflammatory changes. Spleen: Normal in size without focal abnormality. Adrenals/Urinary Tract: 2.6 cm right adrenal mass, stable. Normal left adrenal gland. Subtle areas of relative hypoattenuation in both kidneys, most apparent in the lateral midpole of the left kidney. These may reflect areas bright is. There are new since prior exam stable 11 mm lower pole left renal mass consistent with a cyst. No other discrete renal masses. No stones. No hydronephrosis. Ureters are normal course and in caliber. Bladder is unremarkable. Stomach/Bowel: Stomach is unremarkable. No bowel dilation or wall thickening. No convincing inflammation. Normal appendix visualized. Vascular/Lymphatic: No pathologically enlarged lymph nodes. Aortic atherosclerosis. No aneurysm. Reproductive: Unremarkable. Other: Small amount of ascites, less than was present on the prior CT. There is no defined collection to suggest an abscess. Hazy opacities noted throughout the peritoneal and omental fat similar to the prior exam. Calcified peritoneal soft tissue mass in the left central abdomen adjacent to a loop of small bowel is stable from the prior study. Musculoskeletal: No fracture or acute finding. No osteoblastic or osteolytic lesions. IMPRESSION: 1. No evidence of an abscess. 2. There are new bilateral centrilobular pulmonary nodules. Although  the differential diagnosis is relatively broad, since these are new, infection with endobronchial spread such as from non tubercular mycobacteria or aspergillosis is suspected. Hypersensitivity pneumonitis is an alternative diagnosis. This may be the source of fever. 3. Ascites, decreased when compared the prior CT. 4. Areas of subtle relative decreased attenuation in both kidneys, which could be due to pyelonephritis. No evidence of a renal or perirenal abscess. 5. Gallbladder wall thickening that is most likely nonspecific edema related to ascites. 6. No evidence of bowel inflammation. 7. Stable calcified mesenteric nodule. Electronically Signed   By: Lajean Manes M.D.   On: 05/05/2018 07:20   Dg Chest Port 1 View  Result Date: 05/23/2018 CLINICAL DATA:  Shortness of breath and fever. EXAM: PORTABLE CHEST 1 VIEW COMPARISON:  05/10/2018 and prior chest radiographs FINDINGS: Cardiomediastinal silhouette is unchanged. Bilateral airspace opacities, greatest in the UPPER lungs, have not significantly changed. No pleural effusion or pneumothorax. No acute bony abnormalities identified. IMPRESSION: Unchanged bilateral airspace opacities, UPPER lobe predominance. Electronically Signed   By: Margarette Canada M.D.   On: 05/23/2018 15:55   Dg Chest Port 1 View  Result Date: 05/10/2018 CLINICAL DATA:  Shortness of breath. EXAM: PORTABLE CHEST 1 VIEW COMPARISON:  Chest CT 05/02/2018 and chest radiograph 05/02/2018. FINDINGS: Cardiomediastinal silhouette is normal. Mediastinal contours appear intact. Low lung volumes. Worsening aeration of the lungs with increasing bilateral upper lobe predominant airspace consolidation, and cavitary spaces in the upper lobes. Osseous structures are without acute abnormality. Soft tissues are grossly normal. IMPRESSION: Worsening aeration of the lungs with increasing bilateral upper lobe predominant airspace consolidation, and cavitary spaces in the upper lobes. Electronically Signed   By:  Fidela Salisbury M.D.   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  He presents for bone marrow biopsy and culture. EXAM: CT GUIDED BONE MARROW ASPIRATION AND CORE BIOPSY Interventional Radiologist:  Criselda Peaches, MD MEDICATIONS: None. ANESTHESIA/SEDATION: Moderate (conscious) sedation was employed during this procedure. A total of 1.5 milligrams versed and 50 micrograms fentanyl were administered intravenously. The patient's level of consciousness and vital signs were monitored continuously by radiology nursing throughout the procedure under my direct supervision. Total monitored sedation time: 10 minutes FLUOROSCOPY TIME:  Fluoroscopy Time: 0 minutes 0 seconds (0 mGy). COMPLICATIONS: None immediate. Estimated blood loss: <25 mL PROCEDURE: Informed written consent was obtained from the patient after a thorough discussion of the procedural risks, benefits and alternatives. All questions were addressed. Maximal Sterile Barrier Technique was utilized including caps, mask, sterile gowns, sterile gloves, sterile drape, hand hygiene and skin antiseptic. A timeout was performed prior to the initiation of the procedure. The patient was positioned prone and non-contrast localization CT was performed of the pelvis to demonstrate the iliac marrow spaces. Maximal barrier sterile technique utilized including caps, mask, sterile gowns, sterile gloves, large sterile drape, hand hygiene, and betadine prep. Under sterile conditions and local anesthesia, an 11 gauge coaxial bone biopsy needle was advanced into the right iliac marrow space. Needle position was confirmed with CT imaging. Initially, bone marrow aspiration was performed. Next, the 11 gauge outer cannula was utilized to obtain a right iliac bone marrow core biopsy. Needle was removed. Hemostasis was obtained with compression. The patient  tolerated the procedure well. Samples were prepared with the cytotechnologist. IMPRESSION: Technically successful CT-guided bone marrow biopsy, aspiration and bone culture. Signed, Criselda Peaches, MD [facility] Electronically Signed   By: Jacqulynn Cadet M.D.   On: 05/10/2018 09:53   US Abdomen Limited Ruq  Result Date: 05/09/2018 CLINICAL DATA:  Abnormal LFTs EXAM: ULTRASOUND ABDOMEN LIMITED RIGHT UPPER QUADRANT COMPARISON:  CT 05/05/2018 FINDINGS: Gallbladder: Marked gallbladder wall thickening measuring up to 9 mm. No stones or sonographic Murphy's sign. Common bile duct: Diameter: Normal caliber, 4 mm Liver: No focal lesion identified. Within normal limits in parenchymal echogenicity. Portal vein is patent on color Doppler imaging with normal direction of blood flow towards the liver. Also noted is a right pleural effusion and ascites. IMPRESSION: Markedly thickened gallbladder wall. No gallstones visualized. This could be related to liver disease or cholecystitis. Right pleural effusion, mild perihepatic ascites. Electronically Signed   By: Rolm Baptise M.D.   On: 05/09/2018 08:31    Time Spent in minutes  30   Jani Gravel M.D on 05/27/2018 at 8:19 AM  Between 7am to 7pm - Pager - [phone]    After 7pm go to [email or web address] - password [facility]  [facility] -  Office  [phone]

## 2018-05-27 NOTE — Progress Notes (Signed)
Pt alert and oriented, skin warm and dry, asymptomatic to CBG 51.  Gave 50 ml D50.  Adised Dr. Maudie Mercury on floor. V/O give entire 50 ml.

## 2018-05-27 NOTE — Progress Notes (Signed)
Pt signed AMA papers.  Pt spoke with mother and she will be picking him up at the ER entrance. Pt left floor ambulating.  Pt alert and oriented X 4. Pleasant but wants to leave.

## 2018-05-27 NOTE — Progress Notes (Signed)
CBG 51 this am paged Dr. Corky Mull.

## 2018-05-27 NOTE — Progress Notes (Signed)
Pt temp 102.7, paged Dr. Corky Mull.

## 2018-05-28 DIAGNOSIS — R112 Nausea with vomiting, unspecified: Secondary | ICD-10-CM

## 2018-05-28 DIAGNOSIS — R1013 Epigastric pain: Secondary | ICD-10-CM

## 2018-05-28 DIAGNOSIS — R638 Other symptoms and signs concerning food and fluid intake: Secondary | ICD-10-CM

## 2018-05-28 LAB — GLUCOSE, CAPILLARY
GLUCOSE-CAPILLARY: 145 mg/dL — AB (ref 65–99)
GLUCOSE-CAPILLARY: 191 mg/dL — AB (ref 65–99)
GLUCOSE-CAPILLARY: 70 mg/dL (ref 65–99)
GLUCOSE-CAPILLARY: 82 mg/dL (ref 65–99)
Glucose-Capillary: 109 mg/dL — ABNORMAL HIGH (ref 65–99)
Glucose-Capillary: 87 mg/dL (ref 65–99)
Glucose-Capillary: 99 mg/dL (ref 65–99)

## 2018-05-28 LAB — CBC
HCT: 31.8 % — ABNORMAL LOW (ref 39.0–52.0)
Hemoglobin: 10.7 g/dL — ABNORMAL LOW (ref 13.0–17.0)
MCH: 25.8 pg — ABNORMAL LOW (ref 26.0–34.0)
MCHC: 33.6 g/dL (ref 30.0–36.0)
MCV: 76.8 fL — ABNORMAL LOW (ref 78.0–100.0)
PLATELETS: 163 10*3/uL (ref 150–400)
RBC: 4.14 MIL/uL — ABNORMAL LOW (ref 4.22–5.81)
RDW: 16.9 % — AB (ref 11.5–15.5)
WBC: 9.3 10*3/uL (ref 4.0–10.5)

## 2018-05-28 LAB — COMPREHENSIVE METABOLIC PANEL
ALBUMIN: 1.3 g/dL — AB (ref 3.5–5.0)
ALT: 26 U/L (ref 17–63)
AST: 74 U/L — ABNORMAL HIGH (ref 15–41)
Alkaline Phosphatase: 445 U/L — ABNORMAL HIGH (ref 38–126)
Anion gap: 7 (ref 5–15)
BUN: 5 mg/dL — AB (ref 6–20)
CHLORIDE: 112 mmol/L — AB (ref 101–111)
CO2: 18 mmol/L — ABNORMAL LOW (ref 22–32)
Calcium: 6.8 mg/dL — ABNORMAL LOW (ref 8.9–10.3)
Creatinine, Ser: 0.76 mg/dL (ref 0.61–1.24)
GFR calc Af Amer: >60 mL/min (ref >60)
GFR calc non Af Amer: >60 mL/min (ref >60)
GLUCOSE: 136 mg/dL — AB (ref 65–99)
POTASSIUM: 3.3 mmol/L — AB (ref 3.5–5.1)
SODIUM: 137 mmol/L (ref 135–145)
Total Bilirubin: 1.3 mg/dL — ABNORMAL HIGH (ref 0.3–1.2)
Total Protein: 4.5 g/dL — ABNORMAL LOW (ref 6.5–8.1)

## 2018-05-28 LAB — CULTURE, BLOOD (ROUTINE X 2)
CULTURE: NO GROWTH
Culture: NO GROWTH
SPECIAL REQUESTS: ADEQUATE

## 2018-05-28 MED ORDER — HYDROMORPHONE HCL 1 MG/ML IJ SOLN
1.0000 mg | INTRAMUSCULAR | Status: DC | PRN
  Administered 2018-05-28 – 2018-06-04 (×7): 1 mg via INTRAVENOUS
  Filled 2018-05-28 (×7): qty 1

## 2018-05-28 MED ORDER — POTASSIUM CHLORIDE CRYS ER 20 MEQ PO TBCR
40.0000 meq | EXTENDED_RELEASE_TABLET | Freq: Once | ORAL | Status: AC
  Administered 2018-05-28: 40 meq via ORAL
  Filled 2018-05-28: qty 2

## 2018-05-28 MED ORDER — CHOLESTYRAMINE LIGHT 4 G PO PACK
4.0000 g | PACK | Freq: Two times a day (BID) | ORAL | Status: DC
  Administered 2018-05-28 – 2018-06-05 (×12): 4 g via ORAL
  Filled 2018-05-28 (×17): qty 1

## 2018-05-28 NOTE — Progress Notes (Signed)
[facility]                                                                              Patient Demographics  Jeffrey Hayes, is a 67 y.o. male, DOB - 1951-01-17, MMN:[id number]  Admit date - 05/23/2018   Admitting Physician Rise Patience, MD  Outpatient Primary MD for the patient is [name], MD  Outpatient specialists:   LOS - 5  days   Medical records reviewed and are as summarized below:    Chief Complaint  Patient presents with  . Chest Pain  . Fever  . Diarrhea       Brief summary   Jeffrey Hayes is a 67 y.o. male with recently diagnosed pulmonary tuberculosis discharged home on antituberculosis medications presents to the ER with complaints of persistent nausea vomiting and diarrhea over the last few days.  Also has been having hiccups.  Has been feeling weak.  Has not been able to eat anything because of the vomiting.  Patient has been compliant with his anti-tuberculosis medications.  In ED, patient was febrile, tachycardia, lactic acidosis, hypokalemia, hypomagnesemia. CT chest abdomen and pelvis showed fused lung nodules and apical predominant lung consolidation, small right pleural effusion, scattered air-fluid levels in the colon and rectum suggesting viral illness, no bowel obstruction.      Assessment & Plan    Principal Problem:   Sepsis ([place]), bilateral cavitary pneumonia, active tuberculosis, in the setting of sarcoidosis -ID following, recommended to continue [place] anti TB meds with vitamin B6  Active Problems: Nausea, vomiting, diarrhea -Possibly due to medications, CT abdomen showed no obstruction or acute abdominal pathology -C. difficile negative, GI pathogen panel negative -Continue scheduled Zofran, Phenergan as needed, patient will benefit more if he receives antiemetics prior to the anti-TB meds. - Nausea vomiting improving however still having diarrhea, despite scheduled loperamide, continue rectal tube -Placed on  cholestyramine   Transaminitis with obstructive pattern -Possibly due to active TB and medications Patient had-right upper quadrant ultrasound on 05/09/2018 which showed no gallstones however markedly thickened gallbladder wall could be related to liver disease or cholecystitis. -CT abdomen pelvis on admission showed no gallstones, gallbladder wall thickening or biliary dilatation, no focal liver abnormality. 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, sarcoidosis:  -Platelet count stable, improving -Patient underwent bone marrow biopsy during the previous admission which showed normocellular marrow with numerous granulomata  Type 2 diabetes mellitus -CBG stable, continue sliding scale insulin  -Hemoglobin A1c 11.8 -Amaryl and metformin were discontinued during the previous admission  Essential hypertension -BP currently stable  hypokalemia, hypomagnesemia -Replace potassium  Severe protein calorie malnutrition with hypoalbuminemia -Nutrition consulted, albumin 1.6   Mildly elevated troponin -Likely due to #1, no chest pain -2D echo showed EF of 60 to 65%, no diagnostic regional wall motion abnormalities.   Code Status: Full CODE STATUS DVT Prophylaxis:  Lovenox Family Communication: Discussed in detail with the patient, all imaging results, lab results explained to the patient    Disposition Plan:   Time Spent in minutes  25 minutes  Procedures:  2D echo  Consultants:   Infectious disease  Antimicrobials:  Medications  Scheduled Meds: . baclofen  5 mg Oral TID  . cholestyramine light  4 g Oral BID AC  . diphenoxylate-atropine  1 tablet Oral QID  . enoxaparin (LOVENOX) injection  40 mg Subcutaneous Q24H  . ethambutol  1,200 mg Oral Daily  . feeding supplement  1 Container Oral TID BM  . isoniazid  300 mg Oral Daily  . multivitamin with minerals  1 tablet Oral Daily  . ondansetron (ZOFRAN) IV  4 mg Intravenous TID AC  . pyrazinamide  1,500  mg Oral Daily  . pyridOXINE  50 mg Oral Daily  . rifampin  600 mg Oral Daily   Continuous Infusions: . sodium chloride 10 mL/hr at 05/25/18 1311  . sodium chloride 10 mL/hr at 05/27/18 0600   PRN Meds:.acetaminophen **OR** acetaminophen, loperamide, promethazine   Antibiotics   Anti-infectives (From admission, onward)   Start     Dose/Rate Route Frequency Ordered Stop   05/24/18 1000  ethambutol (MYAMBUTOL) tablet 1,200 mg     1,200 mg Oral Daily 05/23/18 2104     05/24/18 1000  isoniazid (NYDRAZID) tablet 300 mg     300 mg Oral Daily 05/23/18 2104     05/24/18 1000  pyrazinamide tablet 1,500 mg     1,500 mg Oral Daily 05/23/18 2104     05/24/18 1000  rifampin (RIFADIN) capsule 600 mg     600 mg Oral Daily 05/23/18 2104     05/23/18 2330  piperacillin-tazobactam (ZOSYN) IVPB 3.375 g  Status:  Discontinued     3.375 g 12.5 mL/hr over 240 Minutes Intravenous Every 8 hours 05/23/18 1529 05/25/18 1237   05/23/18 2330  vancomycin (VANCOCIN) IVPB 750 mg/150 ml premix  Status:  Discontinued     750 mg 150 mL/hr over 60 Minutes Intravenous Every 8 hours 05/23/18 1530 05/23/18 2104   05/23/18 1530  piperacillin-tazobactam (ZOSYN) IVPB 3.375 g     3.375 g 100 mL/hr over 30 Minutes Intravenous  Once 05/23/18 1515 05/23/18 1625   05/23/18 1530  vancomycin (VANCOCIN) IVPB 1000 mg/200 mL premix  Status:  Discontinued     1,000 mg 200 mL/hr over 60 Minutes Intravenous  Once 05/23/18 1515 05/23/18 1528   05/23/18 1530  vancomycin (VANCOCIN) 1,500 mg in sodium chloride 0.9 % 500 mL IVPB     1,500 mg 250 mL/hr over 120 Minutes Intravenous  Once 05/23/18 1528 05/23/18 1807        Subjective:   Jeffrey Hayes was seen and examined today.  Low-grade temp of 100.7 F, still having diarrhea, watery.  No nausea or vomiting this morning.  Flat affect.  No chest pain or shortness of breath   Objective:   Vitals:   05/27/18 2148 05/28/18 0430 05/28/18 0500 05/28/18 0846  BP: 101/73 110/72   115/78  Pulse: 89 98  (!) 117  Resp: 20 16  20   Temp: 98.4 F (36.9 C) (!) 100.7 F (38.2 C)  97.7 F (36.5 C)  TempSrc: Oral Oral  Oral  SpO2: 96% 95%  96%  Weight:   69.5 kg (153 lb 3.5 oz)   Height:        Intake/Output Summary (Last 24 hours) at 05/28/2018 1215 Last data filed at 05/28/2018 0850 Gross per 24 hour  Intake 150 ml  Output 1475 ml  Net -1325 ml     Wt Readings from Last 3 Encounters:  05/28/18 69.5 kg (153 lb 3.5 oz)  05/14/18 68.7 kg (151 lb 7.3 oz)  03/11/18  75.3 kg (166 lb)     Exam   General: Alert and oriented x 3, NAD, cachectic appearing Eyes:  HEENT:  Cardiovascular: S1 S2 auscultated, Regular rate and rhythm. No pedal edema b/l Respiratory: Clear to auscultation bilaterally, no wheezing, rales or rhonchi Gastrointestinal: Soft, nontender, nondistended, + bowel sounds Ext: no pedal edema bilaterally Neuro: no FND Musculoskeletal: No digital cyanosis, clubbing Skin: No rashes  Psych: flat affect  GU: Rectal tube    Data Reviewed:  I have personally reviewed following labs and imaging studies  Micro Results Recent Results (from the past 240 hour(s))  Culture, blood (Routine x 2)     Status: None (Preliminary result)   Collection Time: 05/23/18  2:53 PM  Result Value Ref Range Status   Specimen Description BLOOD RIGHT ANTECUBITAL  Final   Special Requests   Final    BOTTLES DRAWN AEROBIC AND ANAEROBIC Blood Culture adequate volume   Culture   Final    NO GROWTH 4 DAYS Performed at [facility], [address] [place], [place] [address]    Report Status PENDING  Incomplete  Culture, blood (Routine x 2)     Status: None (Preliminary result)   Collection Time: 05/23/18  3:43 PM  Result Value Ref Range Status   Specimen Description BLOOD BLOOD LEFT FOREARM  Final   Special Requests   Final    BOTTLES DRAWN AEROBIC ONLY Blood Culture results may not be optimal due to an inadequate volume of blood received in culture bottles    Culture   Final    NO GROWTH 4 DAYS Performed at [facility], [place] [address], [place], [place] [address]    Report Status PENDING  Incomplete  Gastrointestinal Panel by PCR , Stool     Status: None   Collection Time: 05/23/18 10:45 PM  Result Value Ref Range Status   Campylobacter species NOT DETECTED NOT DETECTED Final   Plesimonas shigelloides NOT DETECTED NOT DETECTED Final   Salmonella species NOT DETECTED NOT DETECTED Final   Yersinia enterocolitica NOT DETECTED NOT DETECTED Final   Vibrio species NOT DETECTED NOT DETECTED Final   Vibrio cholerae NOT DETECTED NOT DETECTED Final   Enteroaggregative E coli (EAEC) NOT DETECTED NOT DETECTED Final   Enteropathogenic E coli (EPEC) NOT DETECTED NOT DETECTED Final   Enterotoxigenic E coli (ETEC) NOT DETECTED NOT DETECTED Final   Shiga like toxin producing E coli (STEC) NOT DETECTED NOT DETECTED Final   Shigella/Enteroinvasive E coli (EIEC) NOT DETECTED NOT DETECTED Final   Cryptosporidium NOT DETECTED NOT DETECTED Final   Cyclospora cayetanensis NOT DETECTED NOT DETECTED Final   Entamoeba histolytica NOT DETECTED NOT DETECTED Final   Giardia lamblia NOT DETECTED NOT DETECTED Final   Adenovirus F40/41 NOT DETECTED NOT DETECTED Final   Astrovirus NOT DETECTED NOT DETECTED Final   Norovirus GI/GII NOT DETECTED NOT DETECTED Final   Rotavirus A NOT DETECTED NOT DETECTED Final   Sapovirus (I, II, IV, and V) NOT DETECTED NOT DETECTED Final    Comment: Performed at [facility], [address] [place], [place] [address]  C difficile quick scan w PCR reflex     Status: None   Collection Time: 05/23/18 11:00 PM  Result Value Ref Range Status   C Diff antigen NEGATIVE NEGATIVE Final   C Diff toxin NEGATIVE NEGATIVE Final   C Diff interpretation No C. difficile detected.  Final    Comment: Performed at [facility], [place]  [address] [place], [place] [address]  Urine culture     Status: None   Collection Time: 05/24/18   1:37 AM  Result Value Ref Range Status   Specimen Description URINE, CLEAN CATCH  Final   Special Requests NONE  Final   Culture   Final    NO GROWTH Performed at [facility], [place] [address] [place], [place] [address]    Report Status 05/25/2018 FINAL  Final    Radiology Reports Ct Abdomen Pelvis Wo Contrast  Result Date: 05/23/2018 CLINICAL DATA:  Tuberculosis.  Worsening cough and fever. EXAM: CT CHEST, ABDOMEN AND PELVIS WITHOUT CONTRAST TECHNIQUE: Multidetector CT imaging of the chest, abdomen and pelvis was performed following the standard protocol without IV contrast. COMPARISON:  Chest radiograph 05/23/2018. Chest CTA 05/02/2018. CT abdomen and pelvis 05/05/2018. CONTRAST EXTRAVASATION CONSULTATION: Type of contrast:  Isovue 300 Site of extravasation: Left antecubital fossa Estimated volume of extravasation: 100 ml Area of extravasation scanned with CT? no PATIENT'S SIGNS AND SYMPTOMS Skin blistering/ulceration: no Decrease capillary refill: no Change in skin color: no Decreased motor function or severe tightness: no Decreased pulses distal to site of extravasation: no Altered sensation: no Increasing pain or signs of increased swelling during observation: no TREATMENT Observation period at site: Patient returned to the emergency department for continued observation. Orders placed for limb elevation and application of ice pack. Plastic surgery consulted? no DOCUMENTATION AND FOLLOW-UP Post extravasation orders completed? yes Patient's questions answered? yes Patient instructed to call [phone] or seek immediate medical care for new/progressive symptoms. FINDINGS: CT CHEST FINDINGS Cardiovascular: Thoracic aortic atherosclerosis without aneurysm. Normal heart size. Scattered coronary artery calcification. No significant pericardial effusion. Mediastinum/Nodes: AP window lymph nodes measure up to 12 mm in short axis, similar to the prior CT. Similar 1.4 cm short axis right hilar lymph  node. Mild motion and streak artifact through the subcarinal region limits evaluation of the previously described lymph node in this location. Unremarkable thyroid. Possible mild esophageal wall thickening. Lungs/Pleura: Small right pleural effusion, new from the prior CT. No pneumothorax. Innumerable nodules are again seen diffusely throughout both lungs, greatest in the upper lungs and less in the bases. Bilateral upper lobe airspace consolidation is greatest in the apices, overall stable to slightly progressed since the prior CT. Small areas of cavitation within the biapical consolidation have not significantly progressed. A small amount of patchy subpleural consolidation anteriorly in the right middle lobe is new from the prior CT, and patchy right middle lobe consolidation laterally has increased. There is mild dependent atelectasis in the right lower lobe. Musculoskeletal: Postsurgical changes at the left shoulder. Mild thoracic disc degeneration. No suspicious osseous lesion. CT ABDOMEN PELVIS FINDINGS The study is mildly motion degraded. Hepatobiliary: No focal liver abnormality is seen. No gallstones, gallbladder wall thickening, or biliary dilatation. Pancreas: Unremarkable. Spleen: Unremarkable. Adrenals/Urinary Tract: Unchanged right adrenal nodule. Unremarkable left adrenal gland. Unchanged 11 mm left lower pole renal hypodensity, likely a cyst. Areas of subtle hypoenhancement in both kidneys on the prior CT cannot be re-evaluated today due to lack of IV contrast. There is no hydronephrosis. Unremarkable bladder. Stomach/Bowel: The stomach is largely collapsed. Scattered fluid is present throughout the colon, and there is also a moderate-sized air-fluid level in the rectum. Fluid is also noted in nondilated small bowel loops. There is no evidence of bowel obstruction. The appendix is grossly unremarkable. Vascular/Lymphatic: Mild abdominal aortic atherosclerosis without aneurysm. No enlarged lymph  nodes. Reproductive: Unremarkable prostate. Other: Trace ascites, decreased from prior.  No loculated fluid collection. Persistent diffusely edematous appearance of the intra-abdominal fat. Unchanged 1.5 cm calcified mesenteric nodule in the left mid abdomen (series 5, image 76). Musculoskeletal: No suspicious osseous lesion. Lower lumbar disc degeneration including severe disc space narrowing and spurring at L4-5 and L5-S1 resulting in moderate to severe bilateral neural foraminal stenosis. IMPRESSION: 1. Diffuse lung nodules and apical predominant lung consolidation, overall stable to mildly progressed from 05/02/2018 and consistent with the patient's diagnosis of tuberculosis. 2. Small right pleural effusion, new from 05/02/2018. 3. Unchanged mild mediastinal and right hilar lymphadenopathy. 4. Scattered air-fluid levels in the colon and rectum suggesting a diarrheal illness. No bowel obstruction. 5. Trace ascites, decreased from prior. 6. Unchanged calcified mesenteric nodule. 7.  Aortic Atherosclerosis (ICD10-I70.0). 8. Contrast extravasation as documented above. Electronically Signed   By: Logan Bores M.D.   On: 05/23/2018 18:12   Dg Chest 1 View  Result Date: 05/27/2018 CLINICAL DATA:  Encounter for fever and chills. EXAM: CHEST  1 VIEW COMPARISON:  Chest CT, 05/23/2018. Chest radiographs, most recent 05/23/2018. FINDINGS: Multiple bilateral small lung nodules with hazy airspace type lung opacity, most evident in the upper lobes, is without significant change. No new lung abnormalities. Heart is normal in size.  No mediastinal or hilar masses. No convincing pleural effusion.  No pneumothorax. IMPRESSION: 1. No significant change in the previously described bilateral lung opacities and numerous bilateral lung nodules. 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 Electronically Signed   By: Lajean Manes M.D.   On: 05/27/2018 13:25   Dg Chest 2 View  Result Date: 05/02/2018 CLINICAL DATA:   67 year old male with cirrhosis. Weakness and loss of appetite. EXAM: CHEST - 2 VIEW COMPARISON:  Chest CT 08/25/2017, radiographs 06/25/2016. FINDINGS: Semi upright AP and lateral views of the chest. Diffuse reticulonodular pulmonary opacity with an upper lobe predominance, and some semi confluent areas of upper lobe involvement. No superimposed pneumothorax or pleural effusion. Mediastinal contours remain normal. Visualized tracheal air column is within normal limits. Chronic postoperative changes to the left scapula. No acute osseous abnormality identified. Negative visible bowel gas pattern. IMPRESSION: Diffuse pulmonary reticulonodular opacity with some mildly confluent areas in the bilateral upper lobes. No pleural effusion. The appearance is nonspecific. Favor acute viral/atypical respiratory infection. Characterization with Chest CT (noncontrast probably would suffice) may be valuable. Electronically Signed   By: Genevie Ann M.D.   On: 05/02/2018 16:30   Ct Chest Wo Contrast  Result Date: 05/23/2018 CLINICAL DATA:  Tuberculosis.  Worsening cough and fever. EXAM: CT CHEST, ABDOMEN AND PELVIS WITHOUT CONTRAST TECHNIQUE: Multidetector CT imaging of the chest, abdomen and pelvis was performed following the standard protocol without IV contrast. COMPARISON:  Chest radiograph 05/23/2018. Chest CTA 05/02/2018. CT abdomen and pelvis 05/05/2018. CONTRAST EXTRAVASATION CONSULTATION: Type of contrast:  Isovue 300 Site of extravasation: Left antecubital fossa Estimated volume of extravasation: 100 ml Area of extravasation scanned with CT? no PATIENT'S SIGNS AND SYMPTOMS Skin blistering/ulceration: no Decrease capillary refill: no Change in skin color: no Decreased motor function or severe tightness: no Decreased pulses distal to site of extravasation: no Altered sensation: no Increasing pain or signs of increased swelling during observation: no TREATMENT Observation period at site: Patient returned to the emergency  department for continued observation. Orders placed for limb elevation and application of ice pack. Plastic surgery consulted? no DOCUMENTATION AND FOLLOW-UP Post extravasation orders completed? yes Patient's questions answered? yes Patient instructed to call [phone] or seek immediate medical care for new/progressive symptoms. FINDINGS: CT  CHEST FINDINGS Cardiovascular: Thoracic aortic atherosclerosis without aneurysm. Normal heart size. Scattered coronary artery calcification. No significant pericardial effusion. Mediastinum/Nodes: AP window lymph nodes measure up to 12 mm in short axis, similar to the prior CT. Similar 1.4 cm short axis right hilar lymph node. Mild motion and streak artifact through the subcarinal region limits evaluation of the previously described lymph node in this location. Unremarkable thyroid. Possible mild esophageal wall thickening. Lungs/Pleura: Small right pleural effusion, new from the prior CT. No pneumothorax. Innumerable nodules are again seen diffusely throughout both lungs, greatest in the upper lungs and less in the bases. Bilateral upper lobe airspace consolidation is greatest in the apices, overall stable to slightly progressed since the prior CT. Small areas of cavitation within the biapical consolidation have not significantly progressed. A small amount of patchy subpleural consolidation anteriorly in the right middle lobe is new from the prior CT, and patchy right middle lobe consolidation laterally has increased. There is mild dependent atelectasis in the right lower lobe. Musculoskeletal: Postsurgical changes at the left shoulder. Mild thoracic disc degeneration. No suspicious osseous lesion. CT ABDOMEN PELVIS FINDINGS The study is mildly motion degraded. Hepatobiliary: No focal liver abnormality is seen. No gallstones, gallbladder wall thickening, or biliary dilatation. Pancreas: Unremarkable. Spleen: Unremarkable. Adrenals/Urinary Tract: Unchanged right adrenal  nodule. Unremarkable left adrenal gland. Unchanged 11 mm left lower pole renal hypodensity, likely a cyst. Areas of subtle hypoenhancement in both kidneys on the prior CT cannot be re-evaluated today due to lack of IV contrast. There is no hydronephrosis. Unremarkable bladder. Stomach/Bowel: The stomach is largely collapsed. Scattered fluid is present throughout the colon, and there is also a moderate-sized air-fluid level in the rectum. Fluid is also noted in nondilated small bowel loops. There is no evidence of bowel obstruction. The appendix is grossly unremarkable. Vascular/Lymphatic: Mild abdominal aortic atherosclerosis without aneurysm. No enlarged lymph nodes. Reproductive: Unremarkable prostate. Other: Trace ascites, decreased from prior. No loculated fluid collection. Persistent diffusely edematous appearance of the intra-abdominal fat. Unchanged 1.5 cm calcified mesenteric nodule in the left mid abdomen (series 5, image 76). Musculoskeletal: No suspicious osseous lesion. Lower lumbar disc degeneration including severe disc space narrowing and spurring at L4-5 and L5-S1 resulting in moderate to severe bilateral neural foraminal stenosis. IMPRESSION: 1. Diffuse lung nodules and apical predominant lung consolidation, overall stable to mildly progressed from 05/02/2018 and consistent with the patient's diagnosis of tuberculosis. 2. Small right pleural effusion, new from 05/02/2018. 3. Unchanged mild mediastinal and right hilar lymphadenopathy. 4. Scattered air-fluid levels in the colon and rectum suggesting a diarrheal illness. No bowel obstruction. 5. Trace ascites, decreased from prior. 6. Unchanged calcified mesenteric nodule. 7.  Aortic Atherosclerosis (ICD10-I70.0). 8. Contrast extravasation as documented above. Electronically Signed   By: Logan Bores M.D.   On: 05/23/2018 18:12   Ct Angio Chest Pe W And/or Wo Contrast  Result Date: 05/02/2018 CLINICAL DATA:  Three weeks of decreased appetite and  generalized weakness. Shortness of breath with exertion. No fever. EXAM: CT ANGIOGRAPHY CHEST WITH CONTRAST TECHNIQUE: Multidetector CT imaging of the chest was performed using the standard protocol during bolus administration of intravenous contrast. Multiplanar CT image reconstructions and MIPs were obtained to evaluate the vascular anatomy. CONTRAST:  117m ISOVUE-370 IOPAMIDOL (ISOVUE-370) INJECTION 76% COMPARISON:  Chest x-ray May 02, 2018.  Chest CT September 02, 2017 FINDINGS: Cardiovascular: The thoracic aorta demonstrates atherosclerotic change with no aneurysm or dissection. Stairstep artifact is seen, particularly in the lung bases, limiting evaluation for pulmonary emboli. Taking  the artifact into account, there is no convincing evidence of pulmonary emboli. Mediastinum/Nodes: The thyroid is normal. There is a prominent subcarinal node which measures 18 mm and is stable. Shotty nodes in the AP window are stable. There is a prominent right hilar node which was not well assessed on the previous unenhanced study. Lungs/Pleura: Central airways are normal. No pneumothorax. Bilateral pulmonary infiltrates consisting of both numerous small nodules and more confluent regions. The infiltrates are most prominent in the upper lobes with cavitary rounded regions as seen on coronal image 74 measuring up to 3.6 cm in the right upper lobe, coronal image 67 in the left apex measuring up to 1.9 cm, and coronal image 71 in the left upper lobe measuring up to 1.9 cm. The right middle lobe and lower lobes are involve as well but less severely. No other acute abnormalities identified within the lung parenchyma. Upper Abdomen: Thickening of the right adrenal gland is stable, likely hyperplasia. Increased attenuation in the fat of the omentum within the left upper quadrant is new and nonspecific. Probable minimal ascites. Musculoskeletal: No chest wall abnormality. No acute or significant osseous findings. Review of the MIP  images confirms the above findings. IMPRESSION: 1. Bilateral pulmonary infiltrates most prominent in the upper lobes with rounded regions of cavitation in the bilateral apices. Findings are most consistent with an infectious or inflammatory process. Atypical infections such as tuberculosis should be considered. Non tuberculous mycobacterial infections and fungal infections are also possible. Malignancy is considered less likely. Inflammatory causes such as sarcoidosis could also present with upper lobe cavitating nodules. There are a few scattered prominent nodes in the mediastinum which are stable. There is a prominent right hilar node which was not well assessed on previous unenhanced imaging. 2. Atherosclerotic change in the thoracic aorta. 3. Evaluation for pulmonary emboli is limited due to artifact but no emboli are identified. Findings called to the patient's ER doctor, Dr. Maryan Rued. Aortic Atherosclerosis (ICD10-I70.0). Electronically Signed   By: Dorise Bullion III M.D   On: 05/02/2018 18:02   Ct Abdomen Pelvis W Contrast  Result Date: 05/05/2018 CLINICAL DATA:  Abdominal pain. Fever. Abscess suspected. Unintentional weight loss. EXAM: CT ABDOMEN AND PELVIS WITH CONTRAST TECHNIQUE: Multidetector CT imaging of the abdomen and pelvis was performed using the standard protocol following bolus administration of intravenous contrast. CONTRAST:  100 mL ISOVUE-300 IOPAMIDOL (ISOVUE-300) INJECTION 61%, 161m OMNIPAQUE IOHEXOL 300 MG/ML SOLN COMPARISON:  01/11/2018 FINDINGS: Lower chest: Lung bases show numerous centrilobular nodular opacities, new since the prior exam. There is a small area patchy ground-glass opacity the posterolateral right middle lobe. Minimal right pleural effusion. There is dependent opacity in the posterior right lower lobe adjacent to the pleural effusion that is most likely atelectasis. Hepatobiliary: Liver normal in size and attenuation with no mass or focal lesion. There is  gallbladder wall edema and pericholecystic fluid. No visible gallstones. No bile duct dilation. Pancreas: Unremarkable. No pancreatic ductal dilatation or surrounding inflammatory changes. Spleen: Normal in size without focal abnormality. Adrenals/Urinary Tract: 2.6 cm right adrenal mass, stable. Normal left adrenal gland. Subtle areas of relative hypoattenuation in both kidneys, most apparent in the lateral midpole of the left kidney. These may reflect areas bright is. There are new since prior exam stable 11 mm lower pole left renal mass consistent with a cyst. No other discrete renal masses. No stones. No hydronephrosis. Ureters are normal course and in caliber. Bladder is unremarkable. Stomach/Bowel: Stomach is unremarkable. No bowel dilation or wall thickening. No  convincing inflammation. Normal appendix visualized. Vascular/Lymphatic: No pathologically enlarged lymph nodes. Aortic atherosclerosis. No aneurysm. Reproductive: Unremarkable. Other: Small amount of ascites, less than was present on the prior CT. There is no defined collection to suggest an abscess. Hazy opacities noted throughout the peritoneal and omental fat similar to the prior exam. Calcified peritoneal soft tissue mass in the left central abdomen adjacent to a loop of small bowel is stable from the prior study. Musculoskeletal: No fracture or acute finding. No osteoblastic or osteolytic lesions. IMPRESSION: 1. No evidence of an abscess. 2. There are new bilateral centrilobular pulmonary nodules. Although the differential diagnosis is relatively broad, since these are new, infection with endobronchial spread such as from non tubercular mycobacteria or aspergillosis is suspected. Hypersensitivity pneumonitis is an alternative diagnosis. This may be the source of fever. 3. Ascites, decreased when compared the prior CT. 4. Areas of subtle relative decreased attenuation in both kidneys, which could be due to pyelonephritis. No evidence of a renal  or perirenal abscess. 5. Gallbladder wall thickening that is most likely nonspecific edema related to ascites. 6. No evidence of bowel inflammation. 7. Stable calcified mesenteric nodule. Electronically Signed   By: Lajean Manes M.D.   On: 05/05/2018 07:20   Dg Chest Port 1 View  Result Date: 05/23/2018 CLINICAL DATA:  Shortness of breath and fever. EXAM: PORTABLE CHEST 1 VIEW COMPARISON:  05/10/2018 and prior chest radiographs FINDINGS: Cardiomediastinal silhouette is unchanged. Bilateral airspace opacities, greatest in the UPPER lungs, have not significantly changed. No pleural effusion or pneumothorax. No acute bony abnormalities identified. IMPRESSION: Unchanged bilateral airspace opacities, UPPER lobe predominance. Electronically Signed   By: Margarette Canada M.D.   On: 05/23/2018 15:55   Dg Chest Port 1 View  Result Date: 05/10/2018 CLINICAL DATA:  Shortness of breath. EXAM: PORTABLE CHEST 1 VIEW COMPARISON:  Chest CT 05/02/2018 and chest radiograph 05/02/2018. FINDINGS: Cardiomediastinal silhouette is normal. Mediastinal contours appear intact. Low lung volumes. Worsening aeration of the lungs with increasing bilateral upper lobe predominant airspace consolidation, and cavitary spaces in the upper lobes. Osseous structures are without acute abnormality. Soft tissues are grossly normal. IMPRESSION: Worsening aeration of the lungs with increasing bilateral upper lobe predominant airspace consolidation, and cavitary spaces in the upper lobes. Electronically Signed   By: Fidela Salisbury M.D.   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 He presents for bone marrow biopsy and culture. EXAM: CT GUIDED BONE MARROW ASPIRATION AND CORE BIOPSY Interventional Radiologist:  Criselda Peaches, MD MEDICATIONS: None. ANESTHESIA/SEDATION: Moderate (conscious) sedation was employed during this  procedure. A total of 1.5 milligrams versed and 50 micrograms fentanyl were administered intravenously. The patient's level of consciousness and vital signs were monitored continuously by radiology nursing throughout the procedure under my direct supervision. Total monitored sedation time: 10 minutes FLUOROSCOPY TIME:  Fluoroscopy Time: 0 minutes 0 seconds (0 mGy). COMPLICATIONS: None immediate. Estimated blood loss: <25 mL PROCEDURE: Informed written consent was obtained from the patient after a thorough discussion of the procedural risks, benefits and alternatives. All questions were addressed. Maximal Sterile Barrier Technique was utilized including caps, mask, sterile gowns, sterile gloves, sterile drape, hand hygiene and skin antiseptic. A timeout was performed prior to the initiation of the procedure. The patient was positioned prone and non-contrast localization CT was performed of the pelvis to demonstrate the iliac marrow spaces. Maximal barrier sterile technique utilized including caps, mask, sterile  gowns, sterile gloves, large sterile drape, hand hygiene, and betadine prep. Under sterile conditions and local anesthesia, an 11 gauge coaxial bone biopsy needle was advanced into the right iliac marrow space. Needle position was confirmed with CT imaging. Initially, bone marrow aspiration was performed. Next, the 11 gauge outer cannula was utilized to obtain a right iliac bone marrow core biopsy. Needle was removed. Hemostasis was obtained with compression. The patient tolerated the procedure well. Samples were prepared with the cytotechnologist. IMPRESSION: Technically successful CT-guided bone marrow biopsy, aspiration and bone culture. Signed, Criselda Peaches, MD [facility] Electronically Signed   By: Jacqulynn Cadet M.D.   On: 05/10/2018 09:53   US Abdomen Limited Ruq  Result Date: 05/09/2018 CLINICAL DATA:  Abnormal LFTs EXAM:  ULTRASOUND ABDOMEN LIMITED RIGHT UPPER QUADRANT COMPARISON:  CT 05/05/2018 FINDINGS: Gallbladder: Marked gallbladder wall thickening measuring up to 9 mm. No stones or sonographic Murphy's sign. Common bile duct: Diameter: Normal caliber, 4 mm Liver: No focal lesion identified. Within normal limits in parenchymal echogenicity. Portal vein is patent on color Doppler imaging with normal direction of blood flow towards the liver. Also noted is a right pleural effusion and ascites. IMPRESSION: Markedly thickened gallbladder wall. No gallstones visualized. This could be related to liver disease or cholecystitis. Right pleural effusion, mild perihepatic ascites. Electronically Signed   By: Rolm Baptise M.D.   On: 05/09/2018 08:31    Lab Data:  CBC: Recent Labs  Lab 05/23/18 1435  05/23/18 2321 05/24/18 0346 05/25/18 0639 05/26/18 0740 05/28/18 0705  WBC 19.7*  --  12.1* 11.5* 11.8* 11.5* 9.3  NEUTROABS 17.4*  --   --  10.8*  --   --   --   HGB 10.6*   < > 11.7* 11.9* 11.5* 10.2* 10.7*  HCT 32.0*   < > 36.7* 35.7* 35.0* 31.2* 31.8*  MCV 77.5*  --  81.2 77.4* 78.8 78.0 76.8*  PLT 130*  --  118* 139* 138* 136* 163   < > = values in this interval not displayed.   Basic Metabolic Panel: Recent Labs  Lab 05/23/18 1452  05/23/18 2321 05/24/18 0346 05/25/18 0639 05/26/18 0740 05/27/18 1017 05/28/18 0705  NA  --    < >  --  138 140 137 136 137  K  --    < >  --  3.7 3.6 3.5 3.8 3.3*  CL  --    < >  --  111 116* 115* 110 112*  CO2  --    < >  --  19* 19* 17* 17* 18*  GLUCOSE  --    < >  --  75 68 97 119* 136*  BUN  --    < >  --  7 7 7 6  5*  CREATININE  --    < > 0.87 0.97 0.82 0.83 0.82 0.76  CALCIUM  --    < >  --  7.2* 7.1* 6.9* 7.0* 6.8*  MG 1.4*  --  1.4*  --   --   --   --   --    < > = values in this interval not displayed.   GFR: Estimated Creatinine Clearance: 89.3 mL/min (by C-G formula based on SCr of 0.76 mg/dL). Liver Function Tests: Recent Labs  Lab 05/23/18 1435  05/24/18 0346 05/26/18 0740 05/27/18 1017 05/28/18 0705  AST 108* 115* 80* 82* 74*  ALT 33 40 30 30 26   ALKPHOS 458* 515*  403* 487* 445*  BILITOT 1.1 1.2 1.0 1.3* 1.3*  PROT 3.6* 4.6* 4.2* 4.8* 4.5*  ALBUMIN 1.3* 1.6* 1.3* 1.4* 1.3*   Recent Labs  Lab 05/23/18 1452  LIPASE 23   No results for input(s): AMMONIA in the last 168 hours. Coagulation Profile: Recent Labs  Lab 05/23/18 1435  INR 1.73   Cardiac Enzymes: Recent Labs  Lab 05/24/18 0852 05/24/18 1146 05/24/18 1528  TROPONINI 0.05* 0.03* 0.18*   BNP (last 3 results) No results for input(s): PROBNP in the last 8760 hours. HbA1C: No results for input(s): HGBA1C in the last 72 hours. CBG: Recent Labs  Lab 05/27/18 2106 05/28/18 0046 05/28/18 0428 05/28/18 0718 05/28/18 1151  GLUCAP 91 70 109* 99 191*   Lipid Profile: No results for input(s): CHOL, HDL, LDLCALC, TRIG, CHOLHDL, LDLDIRECT in the last 72 hours. Thyroid Function Tests: No results for input(s): TSH, T4TOTAL, FREET4, T3FREE, THYROIDAB in the last 72 hours. Anemia Panel: No results for input(s): VITAMINB12, FOLATE, FERRITIN, TIBC, IRON, RETICCTPCT in the last 72 hours. Urine analysis:    Component Value Date/Time   COLORURINE YELLOW 05/24/2018 0137   APPEARANCEUR CLEAR 05/24/2018 0137   LABSPEC 1.035 (H) 05/24/2018 0137   PHURINE 5.0 05/24/2018 0137   GLUCOSEU NEGATIVE 05/24/2018 0137   HGBUR NEGATIVE 05/24/2018 0137   BILIRUBINUR NEGATIVE 05/24/2018 0137   KETONESUR NEGATIVE 05/24/2018 0137   PROTEINUR NEGATIVE 05/24/2018 0137   UROBILINOGEN 0.2 02/04/2010 1718   NITRITE NEGATIVE 05/24/2018 0137   LEUKOCYTESUR NEGATIVE 05/24/2018 0137     Ripudeep Rai M.D. [facility] 05/28/2018, 12:15 PM  Pager: [phone] Between 7am to 7pm - call Pager - [phone]  After 7pm go to [email or web address] - password [email or web address]  Call night coverage person covering after 7pm

## 2018-05-28 NOTE — Progress Notes (Signed)
[place] for Infectious Disease   Reason for visit: Follow up on Tb  Interval History: still with poor po, though no hiccups since yesterday. Nausea and vomiting today.  Complains of burning epigastric pain.  Wife at bedside  Physical Exam: Constitutional:  Vitals:   05/28/18 0430 05/28/18 0846  BP: 110/72 115/78  Pulse: 98 (!) 117  Resp: 16 20  Temp: (!) 100.7 F (38.2 C) 97.7 F (36.5 C)  SpO2: 95% 96%   patient appears in NAD, fatigued HENT: no thrush Respiratory: Normal respiratory effort; CTA B Cardiovascular: RRR GI: soft, nt, nd  Review of Systems: Constitutional: negative for fevers and chills Respiratory: negative for cough Musculoskeletal: negative for myalgias and arthralgias  Lab Results  Component Value Date   WBC 9.3 05/28/2018   HGB 10.7 (L) 05/28/2018   HCT 31.8 (L) 05/28/2018   MCV 76.8 (L) 05/28/2018   PLT 163 05/28/2018    Lab Results  Component Value Date   CREATININE 0.76 05/28/2018   BUN 5 (L) 05/28/2018   NA 137 05/28/2018   K 3.3 (L) 05/28/2018   CL 112 (H) 05/28/2018   CO2 18 (L) 05/28/2018    Lab Results  Component Value Date   ALT 26 05/28/2018   AST 74 (H) 05/28/2018   ALKPHOS 445 (H) 05/28/2018     Microbiology: Recent Results (from the past 240 hour(s))  Culture, blood (Routine x 2)     Status: None (Preliminary result)   Collection Time: 05/23/18  2:53 PM  Result Value Ref Range Status   Specimen Description BLOOD RIGHT ANTECUBITAL  Final   Special Requests   Final    BOTTLES DRAWN AEROBIC AND ANAEROBIC Blood Culture adequate volume   Culture   Final    NO GROWTH 4 DAYS Performed at [facility], [address] [place], [place] [address]    Report Status PENDING  Incomplete  Culture, blood (Routine x 2)     Status: None (Preliminary result)   Collection Time: 05/23/18  3:43 PM  Result Value Ref Range Status   Specimen Description BLOOD BLOOD LEFT FOREARM  Final   Special Requests   Final    BOTTLES  DRAWN AEROBIC ONLY Blood Culture results may not be optimal due to an inadequate volume of blood received in culture bottles   Culture   Final    NO GROWTH 4 DAYS Performed at [facility], [place] [address] [place], [place] [address]    Report Status PENDING  Incomplete  Gastrointestinal Panel by PCR , Stool     Status: None   Collection Time: 05/23/18 10:45 PM  Result Value Ref Range Status   Campylobacter species NOT DETECTED NOT DETECTED Final   Plesimonas shigelloides NOT DETECTED NOT DETECTED Final   Salmonella species NOT DETECTED NOT DETECTED Final   Yersinia enterocolitica NOT DETECTED NOT DETECTED Final   Vibrio species NOT DETECTED NOT DETECTED Final   Vibrio cholerae NOT DETECTED NOT DETECTED Final   Enteroaggregative E coli (EAEC) NOT DETECTED NOT DETECTED Final   Enteropathogenic E coli (EPEC) NOT DETECTED NOT DETECTED Final   Enterotoxigenic E coli (ETEC) NOT DETECTED NOT DETECTED Final   Shiga like toxin producing E coli (STEC) NOT DETECTED NOT DETECTED Final   Shigella/Enteroinvasive E coli (EIEC) NOT DETECTED NOT DETECTED Final   Cryptosporidium NOT DETECTED NOT DETECTED Final   Cyclospora cayetanensis NOT DETECTED NOT DETECTED Final   Entamoeba histolytica NOT DETECTED NOT DETECTED Final  Giardia lamblia NOT DETECTED NOT DETECTED Final   Adenovirus F40/41 NOT DETECTED NOT DETECTED Final   Astrovirus NOT DETECTED NOT DETECTED Final   Norovirus GI/GII NOT DETECTED NOT DETECTED Final   Rotavirus A NOT DETECTED NOT DETECTED Final   Sapovirus (I, II, IV, and V) NOT DETECTED NOT DETECTED Final    Comment: Performed at [facility], [address] [place], [place] [address]  C difficile quick scan w PCR reflex     Status: None   Collection Time: 05/23/18 11:00 PM  Result Value Ref Range Status   C Diff antigen NEGATIVE NEGATIVE Final   C Diff toxin NEGATIVE NEGATIVE Final   C Diff interpretation No C. difficile detected.  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  Confirmed with DNA probe.   2.  Hiccups - still improved.   3.  N/V - worsened.  Continue antiemetic  4.  Burning epigastric pain - consider GI cocktail   5. Nutrition - still not eating.  He may need temporary nutrition with very poor po intake.    Dr Baxter Flattery back tomorrow

## 2018-05-29 ENCOUNTER — Inpatient Hospital Stay: Payer: Self-pay

## 2018-05-29 LAB — GLUCOSE, CAPILLARY
GLUCOSE-CAPILLARY: 98 mg/dL (ref 65–99)
GLUCOSE-CAPILLARY: 112 mg/dL — AB (ref 65–99)
Glucose-Capillary: 80 mg/dL (ref 65–99)
Glucose-Capillary: 109 mg/dL — ABNORMAL HIGH (ref 65–99)
Glucose-Capillary: 92 mg/dL (ref 65–99)

## 2018-05-29 MED ORDER — SODIUM CHLORIDE 0.9% FLUSH: 10.0000 mL | INTRAVENOUS | Status: DC | PRN

## 2018-05-29 MED ORDER — HALOPERIDOL LACTATE 5 MG/ML IJ SOLN
0.5000 mg | Freq: Once | INTRAMUSCULAR | Status: AC | PRN
  Administered 2018-05-29: 0.5 mg via INTRAMUSCULAR
  Filled 2018-05-29: qty 1

## 2018-05-29 MED ORDER — GI COCKTAIL ~~LOC~~
30.0000 mL | Freq: Three times a day (TID) | ORAL | Status: DC | PRN
  Administered 2018-05-29 – 2018-06-01 (×2): 30 mL via ORAL
  Filled 2018-05-29 (×2): qty 30

## 2018-05-29 NOTE — Progress Notes (Signed)
Nutrition Follow-up  DOCUMENTATION CODES:   Severe malnutrition in context of chronic illness  INTERVENTION:   Recommend consideration of Cortrak tube placement in post-pyloric position with initiation of TF at low rate with titration as tolerated to goal.  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  Continues but being addressed via nutrition support  GOAL:   Patient will meet greater than or equal to 90% of their needs  Not Met  MONITOR:   PO intake, Supplement acceptance, Labs, Weight trends  REASON FOR ASSESSMENT:   Malnutrition Screening Tool    ASSESSMENT:   67 yo male admitted with chest pain, fever and dairrhea with sepsis due to bilateral cavitary pneumonia with active TB, hx sarcoidosis. Pt with additional hx of HTN, DM  Appetite remains poor, pt eating minimally. Not taking much of the supplements. Pt continues to complain of difficulty swallowing with regards to pain, although pain improved from last visit.  Pt biggest issue had been reflux but started on gi cocktail with improvement Diarrhea continues, C.diff negative, GI panel negative Rectal tube in place, 100 mL today, 25 mL yesterday documented  Noted order for PICC line placement for TPN Recommend consideration of Cortrak tube placement in post-pyloric position with initiation of TF at low rate with titration as tolerated to goal. Text paged sent to MD to discuss  Weight yesterday 141 pounds; weight on admission 151 pounds. No other weights charted  Labs: potassium 3.3, corrected calcium 8.96, albumin 1.3 Meds: MVI, KCl, imodium, zofran, GI cocktail, cholestyramine light  Diet Order:   Diet Order           DIET DYS 3 Room service  appropriate? Yes; Fluid consistency: Thin  Diet effective now          EDUCATION NEEDS:   Education needs have been addressed  Skin:  Skin Integrity Issues:: Stage II, Unstageable Stage II: sacrum Unstageable: penis Incisions: n/a  Last BM:  6/24  Height:   Ht Readings from Last 1 Encounters:  05/24/18 6' 2"  (1.88 m)    Weight:   Wt Readings from Last 1 Encounters:  05/28/18 141 lb 1.5 oz (64 kg)    Ideal Body Weight:     BMI:  Body mass index is 18.12 kg/m.  Estimated Nutritional Needs:    Kcal:  2200-2400 kcals   Protein:  100-120 g  Fluid:  >/= 2.2 L   Kerman Passey MS, RD, LDN, CNSC [phone] Pager  [phone] Weekend/On-Call Pager

## 2018-05-29 NOTE — Progress Notes (Signed)
Entered room pt pulled off rectal pouch,condom cath,threw tele box on floor,pulled out his right upper Picc pt confused not following simple commands awaiting mittens from SPD Forrest Moron NP notified about above information will continue to monitor

## 2018-05-29 NOTE — Progress Notes (Signed)
PHARMACY - ADULT TOTAL PARENTERAL NUTRITION CONSULT NOTE   Pharmacy Consult for TPN Indication: Intolerance to enteral feeding  Patient Measurements: Height: 6\' 2"  (188 cm) Weight: 141 lb 1.5 oz (64 kg) IBW/kg (Calculated) : 82.2 TPN AdjBW (KG): 68.5 Body mass index is 18.12 kg/m. Usual Weight: Unknown  Assessment:  66 YOM presenting with N/V/D and weakness after recently starting RIPE for tuberculosis tx.  CT abdomen negative for acute abdominal pathology or obstruction, C. Diff panel negative.  Difficulty swallowing d/t pain, poor appetite, reflux improved with gi cocktail, continues to have vomiting and diarrhea.  Hx of sarcoidosis, and severe malnutrition related to chronic illness per RD eval.  Pharmacy consulted to start TPN  TPN Access: PICC TPN start date: 6/25 Nutritional Goals (per RD recommendation on 6/24): KCal: 2200-2400 Protein: 100-120 Fluid: >/= 2.2L  Current Nutrition:  Boost Breeze TID (only tolerates occasionally) Minimal PO intake with meals (0-25%)  Plan: Past cut-off for TPN start today - Discuss with primary/ID TPN vs. Post-pyloric TF in AM  Bertis Ruddy M 05/29/2018,3:31 PM

## 2018-05-29 NOTE — Progress Notes (Signed)
Patient continues to refuse to eat and take his evening medications. When asked why, patient stated, "Because I said so! Just get away!" Will continue to encourage and monitor.

## 2018-05-29 NOTE — Progress Notes (Signed)
Patient appears to be more confused than previously reported. MD notified. Will continue to monitor and re-orient patient.

## 2018-05-29 NOTE — Progress Notes (Signed)
[facility]                                                                              Patient Demographics  Jeffrey Hayes, is a 67 y.o. male, DOB - 1951-01-02, FSE:[id number]  Admit date - 05/23/2018   Admitting Physician Rise Patience, MD  Outpatient Primary MD for the patient is [name], MD  Outpatient specialists:   LOS - 6  days   Medical records reviewed and are as summarized below:    Chief Complaint  Patient presents with  . Chest Pain  . Fever  . Diarrhea       Brief summary   Jeffrey Hayes is a 67 y.o. male with recently diagnosed pulmonary tuberculosis discharged home on antituberculosis medications presents to the ER with complaints of persistent nausea vomiting and diarrhea over the last few days.  Also has been having hiccups.  Has been feeling weak.  Has not been able to eat anything because of the vomiting.  Patient has been compliant with his anti-tuberculosis medications.  In ED, patient was febrile, tachycardia, lactic acidosis, hypokalemia, hypomagnesemia. CT chest abdomen and pelvis showed fused lung nodules and apical predominant lung consolidation, small right pleural effusion, scattered air-fluid levels in the colon and rectum suggesting viral illness, no bowel obstruction.      Assessment & Plan    Principal Problem:   Sepsis ([place]), bilateral cavitary pneumonia, active tuberculosis, in the setting of sarcoidosis -ID following, recommended to continue R[place] anti TB meds with vitamin B6  Active Problems: Nausea, vomiting, diarrhea -Possibly due to medications, CT abdomen showed no obstruction or acute abdominal pathology -C. difficile negative, GI pathogen panel negative -Continue scheduled Zofran, Phenergan as needed, patient will benefit more if he receives antiemetics prior to the anti-TB meds.  Continue cholestyramine. -Poor oral intake, continues to have nausea, vomiting, diarrhea, discussed with ID, recommended to  TPN.  Placed PICC line.  Transaminitis with obstructive pattern -Possibly due to active TB and medications Patient had-right upper quadrant ultrasound on 05/09/2018 which showed no gallstones however markedly thickened gallbladder wall could be related to liver disease or cholecystitis. -CT abdomen pelvis on admission showed no gallstones, gallbladder wall thickening or biliary dilatation, no focal liver abnormality. 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, sarcoidosis:  -Platelet count stable, improving -Patient underwent bone marrow biopsy during the previous admission which showed normocellular marrow with numerous granulomata  Type 2 diabetes mellitus -CBG stable, continue sliding scale insulin  -Hemoglobin A1c 11.8 -Amaryl and metformin were discontinued during the previous admission  Essential hypertension -BP currently stable   Severe protein calorie malnutrition with hypoalbuminemia -Nutrition consulted, albumin 1.6  -Start TPN, may eventually need PEG tube  Mildly elevated troponin -Likely due to #1, no chest pain -2D echo showed EF of 60 to 65%, no diagnostic regional wall motion abnormalities.   Code Status: Full CODE STATUS DVT Prophylaxis:  Lovenox Family Communication: Discussed in detail with the patient, all imaging results, lab results explained to the patient low-grade temp of 99.4 F, continues to have nausea, vomiting, diarrhea, poor oral intake.   Disposition Plan:   Time  Spent in minutes  25 minutes  Procedures:  2D echo  Consultants:   Infectious disease  Antimicrobials:      Medications  Scheduled Meds: . baclofen  5 mg Oral TID  . cholestyramine light  4 g Oral BID AC  . diphenoxylate-atropine  1 tablet Oral QID  . enoxaparin (LOVENOX) injection  40 mg Subcutaneous Q24H  . ethambutol  1,200 mg Oral Daily  . feeding supplement  1 Container Oral TID BM  . isoniazid  300 mg Oral Daily  . multivitamin with minerals  1  tablet Oral Daily  . ondansetron (ZOFRAN) IV  4 mg Intravenous TID AC  . pyrazinamide  1,500 mg Oral Daily  . pyridOXINE  50 mg Oral Daily  . rifampin  600 mg Oral Daily   Continuous Infusions: . sodium chloride 10 mL/hr at 05/25/18 1311  . sodium chloride 10 mL/hr at 05/28/18 1551   PRN Meds:.acetaminophen **OR** acetaminophen, gi cocktail, HYDROmorphone (DILAUDID) injection, loperamide, promethazine   Antibiotics   Anti-infectives (From admission, onward)   Start     Dose/Rate Route Frequency Ordered Stop   05/24/18 1000  ethambutol (MYAMBUTOL) tablet 1,200 mg     1,200 mg Oral Daily 05/23/18 2104     05/24/18 1000  isoniazid (NYDRAZID) tablet 300 mg     300 mg Oral Daily 05/23/18 2104     05/24/18 1000  pyrazinamide tablet 1,500 mg     1,500 mg Oral Daily 05/23/18 2104     05/24/18 1000  rifampin (RIFADIN) capsule 600 mg     600 mg Oral Daily 05/23/18 2104     05/23/18 2330  piperacillin-tazobactam (ZOSYN) IVPB 3.375 g  Status:  Discontinued     3.375 g 12.5 mL/hr over 240 Minutes Intravenous Every 8 hours 05/23/18 1529 05/25/18 1237   05/23/18 2330  vancomycin (VANCOCIN) IVPB 750 mg/150 ml premix  Status:  Discontinued     750 mg 150 mL/hr over 60 Minutes Intravenous Every 8 hours 05/23/18 1530 05/23/18 2104   05/23/18 1530  piperacillin-tazobactam (ZOSYN) IVPB 3.375 g     3.375 g 100 mL/hr over 30 Minutes Intravenous  Once 05/23/18 1515 05/23/18 1625   05/23/18 1530  vancomycin (VANCOCIN) IVPB 1000 mg/200 mL premix  Status:  Discontinued     1,000 mg 200 mL/hr over 60 Minutes Intravenous  Once 05/23/18 1515 05/23/18 1528   05/23/18 1530  vancomycin (VANCOCIN) 1,500 mg in sodium chloride 0.9 % 500 mL IVPB     1,500 mg 250 mL/hr over 120 Minutes Intravenous  Once 05/23/18 1528 05/23/18 1807        Subjective:   Jeffrey Hayes was seen and examined today.  Low-grade temp of 99.4 F, continues to have nausea, vomiting, diarrhea, poor oral intake. No chest pain or  shortness of breath   Objective:   Vitals:   05/28/18 1706 05/28/18 2015 05/29/18 0430 05/29/18 0750  BP: 110/75 115/75 110/77 116/71  Pulse: 90 95 94 98  Resp: 18 17 16 16   Temp: 98.5 F (36.9 C) 98.8 F (37.1 C) 98.1 F (36.7 C) 99.4 F (37.4 C)  TempSrc: Oral Oral Oral Oral  SpO2: 98% 94% 97% 96%  Weight:  64 kg (141 lb 1.5 oz)    Height:        Intake/Output Summary (Last 24 hours) at 05/29/2018 1501 Last data filed at 05/29/2018 1436 Gross per 24 hour  Intake 440 ml  Output 965 ml  Net -525 ml  Wt Readings from Last 3 Encounters:  05/28/18 64 kg (141 lb 1.5 oz)  05/14/18 68.7 kg (151 lb 7.3 oz)  03/11/18 75.3 kg (166 lb)     Exam    General: Alert and oriented x 3, NAD, cachectic appearing  Eyes:   HEENT:   Cardiovascular: S1 S2 clear, RRR. No pedal edema b/l  Respiratory: Decreased breath sounds at the bases  Gastrointestinal: Mild diffuse tenderness to deep palpation, soft, NBS  Ext: no pedal edema bilaterally  Neuro: no neuro deficits  Musculoskeletal: No digital cyanosis, clubbing  Skin: No rashes  Psych: anxious   Data Reviewed:  I have personally reviewed following labs and imaging studies  Micro Results Recent Results (from the past 240 hour(s))  Culture, blood (Routine x 2)     Status: None   Collection Time: 05/23/18  2:53 PM  Result Value Ref Range Status   Specimen Description BLOOD RIGHT ANTECUBITAL  Final   Special Requests   Final    BOTTLES DRAWN AEROBIC AND ANAEROBIC Blood Culture adequate volume   Culture   Final    NO GROWTH 5 DAYS Performed at [facility], [address] [place], [place] [address]    Report Status 05/28/2018 FINAL  Final  Culture, blood (Routine x 2)     Status: None   Collection Time: 05/23/18  3:43 PM  Result Value Ref Range Status   Specimen Description BLOOD BLOOD LEFT FOREARM  Final   Special Requests   Final    BOTTLES DRAWN AEROBIC ONLY Blood Culture results may not be optimal  due to an inadequate volume of blood received in culture bottles   Culture   Final    NO GROWTH 5 DAYS Performed at [facility], [place] [address], [place], [place] [address]    Report Status 05/28/2018 FINAL  Final  Gastrointestinal Panel by PCR , Stool     Status: None   Collection Time: 05/23/18 10:45 PM  Result Value Ref Range Status   Campylobacter species NOT DETECTED NOT DETECTED Final   Plesimonas shigelloides NOT DETECTED NOT DETECTED Final   Salmonella species NOT DETECTED NOT DETECTED Final   Yersinia enterocolitica NOT DETECTED NOT DETECTED Final   Vibrio species NOT DETECTED NOT DETECTED Final   Vibrio cholerae NOT DETECTED NOT DETECTED Final   Enteroaggregative E coli (EAEC) NOT DETECTED NOT DETECTED Final   Enteropathogenic E coli (EPEC) NOT DETECTED NOT DETECTED Final   Enterotoxigenic E coli (ETEC) NOT DETECTED NOT DETECTED Final   Shiga like toxin producing E coli (STEC) NOT DETECTED NOT DETECTED Final   Shigella/Enteroinvasive E coli (EIEC) NOT DETECTED NOT DETECTED Final   Cryptosporidium NOT DETECTED NOT DETECTED Final   Cyclospora cayetanensis NOT DETECTED NOT DETECTED Final   Entamoeba histolytica NOT DETECTED NOT DETECTED Final   Giardia lamblia NOT DETECTED NOT DETECTED Final   Adenovirus F40/41 NOT DETECTED NOT DETECTED Final   Astrovirus NOT DETECTED NOT DETECTED Final   Norovirus GI/GII NOT DETECTED NOT DETECTED Final   Rotavirus A NOT DETECTED NOT DETECTED Final   Sapovirus (I, II, IV, and V) NOT DETECTED NOT DETECTED Final    Comment: Performed at [facility], [address] [place], [place] [address]  C difficile quick scan w PCR reflex     Status: None   Collection Time: 05/23/18 11:00 PM  Result Value Ref Range Status   C Diff antigen NEGATIVE NEGATIVE Final   C Diff toxin NEGATIVE NEGATIVE Final  C Diff interpretation No C. difficile detected.  Final    Comment: Performed at [facility], [place] [address], [place], [place]  [address]  Urine culture     Status: None   Collection Time: 05/24/18  1:37 AM  Result Value Ref Range Status   Specimen Description URINE, CLEAN CATCH  Final   Special Requests NONE  Final   Culture   Final    NO GROWTH Performed at [facility], [place] [address], [place], [place] [address]    Report Status 05/25/2018 FINAL  Final  Culture, blood (routine x 2)     Status: None (Preliminary result)   Collection Time: 05/27/18  2:25 PM  Result Value Ref Range Status   Specimen Description BLOOD LEFT ANTECUBITAL  Final   Special Requests   Final    BOTTLES DRAWN AEROBIC AND ANAEROBIC Blood Culture adequate volume   Culture   Final    NO GROWTH 2 DAYS Performed at [facility], [place] [address], [place], [place] [address]    Report Status PENDING  Incomplete  Culture, blood (routine x 2)     Status: None (Preliminary result)   Collection Time: 05/27/18  2:39 PM  Result Value Ref Range Status   Specimen Description BLOOD LEFT HAND  Final   Special Requests   Final    BOTTLES DRAWN AEROBIC AND ANAEROBIC Blood Culture adequate volume   Culture   Final    NO GROWTH 2 DAYS Performed at [facility], [place] [address] [place], [place] [address]    Report Status PENDING  Incomplete    Radiology Reports Ct Abdomen Pelvis Wo Contrast  Result Date: 05/23/2018 CLINICAL DATA:  Tuberculosis.  Worsening cough and fever. EXAM: CT CHEST, ABDOMEN AND PELVIS WITHOUT CONTRAST TECHNIQUE: Multidetector CT imaging of the chest, abdomen and pelvis was performed following the standard protocol without IV contrast. COMPARISON:  Chest radiograph 05/23/2018. Chest CTA 05/02/2018. CT abdomen and pelvis 05/05/2018. CONTRAST EXTRAVASATION CONSULTATION: Type of contrast:  Isovue 300 Site of extravasation: Left antecubital fossa Estimated volume of extravasation: 100 ml Area of extravasation scanned with CT? no PATIENT'S SIGNS AND SYMPTOMS Skin blistering/ulceration: no Decrease capillary refill: no  Change in skin color: no Decreased motor function or severe tightness: no Decreased pulses distal to site of extravasation: no Altered sensation: no Increasing pain or signs of increased swelling during observation: no TREATMENT Observation period at site: Patient returned to the emergency department for continued observation. Orders placed for limb elevation and application of ice pack. Plastic surgery consulted? no DOCUMENTATION AND FOLLOW-UP Post extravasation orders completed? yes Patient's questions answered? yes Patient instructed to call [phone] or seek immediate medical care for new/progressive symptoms. FINDINGS: CT CHEST FINDINGS Cardiovascular: Thoracic aortic atherosclerosis without aneurysm. Normal heart size. Scattered coronary artery calcification. No significant pericardial effusion. Mediastinum/Nodes: AP window lymph nodes measure up to 12 mm in short axis, similar to the prior CT. Similar 1.4 cm short axis right hilar lymph node. Mild motion and streak artifact through the subcarinal region limits evaluation of the previously described lymph node in this location. Unremarkable thyroid. Possible mild esophageal wall thickening. Lungs/Pleura: Small right pleural effusion, new from the prior CT. No pneumothorax. Innumerable nodules are again seen diffusely throughout both lungs, greatest in the upper lungs and less in the bases. Bilateral upper lobe airspace consolidation is greatest in the apices, overall stable to slightly progressed since the prior CT. Small areas of cavitation within the biapical  consolidation have not significantly progressed. A small amount of patchy subpleural consolidation anteriorly in the right middle lobe is new from the prior CT, and patchy right middle lobe consolidation laterally has increased. There is mild dependent atelectasis in the right lower lobe. Musculoskeletal: Postsurgical changes at the left shoulder. Mild thoracic disc degeneration. No suspicious  osseous lesion. CT ABDOMEN PELVIS FINDINGS The study is mildly motion degraded. Hepatobiliary: No focal liver abnormality is seen. No gallstones, gallbladder wall thickening, or biliary dilatation. Pancreas: Unremarkable. Spleen: Unremarkable. Adrenals/Urinary Tract: Unchanged right adrenal nodule. Unremarkable left adrenal gland. Unchanged 11 mm left lower pole renal hypodensity, likely a cyst. Areas of subtle hypoenhancement in both kidneys on the prior CT cannot be re-evaluated today due to lack of IV contrast. There is no hydronephrosis. Unremarkable bladder. Stomach/Bowel: The stomach is largely collapsed. Scattered fluid is present throughout the colon, and there is also a moderate-sized air-fluid level in the rectum. Fluid is also noted in nondilated small bowel loops. There is no evidence of bowel obstruction. The appendix is grossly unremarkable. Vascular/Lymphatic: Mild abdominal aortic atherosclerosis without aneurysm. No enlarged lymph nodes. Reproductive: Unremarkable prostate. Other: Trace ascites, decreased from prior. No loculated fluid collection. Persistent diffusely edematous appearance of the intra-abdominal fat. Unchanged 1.5 cm calcified mesenteric nodule in the left mid abdomen (series 5, image 76). Musculoskeletal: No suspicious osseous lesion. Lower lumbar disc degeneration including severe disc space narrowing and spurring at L4-5 and L5-S1 resulting in moderate to severe bilateral neural foraminal stenosis. IMPRESSION: 1. Diffuse lung nodules and apical predominant lung consolidation, overall stable to mildly progressed from 05/02/2018 and consistent with the patient's diagnosis of tuberculosis. 2. Small right pleural effusion, new from 05/02/2018. 3. Unchanged mild mediastinal and right hilar lymphadenopathy. 4. Scattered air-fluid levels in the colon and rectum suggesting a diarrheal illness. No bowel obstruction. 5. Trace ascites, decreased from prior. 6. Unchanged calcified mesenteric  nodule. 7.  Aortic Atherosclerosis (ICD10-I70.0). 8. Contrast extravasation as documented above. Electronically Signed   By: Logan Bores M.D.   On: 05/23/2018 18:12   Dg Chest 1 View  Result Date: 05/27/2018 CLINICAL DATA:  Encounter for fever and chills. EXAM: CHEST  1 VIEW COMPARISON:  Chest CT, 05/23/2018. Chest radiographs, most recent 05/23/2018. FINDINGS: Multiple bilateral small lung nodules with hazy airspace type lung opacity, most evident in the upper lobes, is without significant change. No new lung abnormalities. Heart is normal in size.  No mediastinal or hilar masses. No convincing pleural effusion.  No pneumothorax. IMPRESSION: 1. No significant change in the previously described bilateral lung opacities and numerous bilateral lung nodules. 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 Electronically Signed   By: Lajean Manes M.D.   On: 05/27/2018 13:25   Dg Chest 2 View  Result Date: 05/02/2018 CLINICAL DATA:  67 year old male with cirrhosis. Weakness and loss of appetite. EXAM: CHEST - 2 VIEW COMPARISON:  Chest CT 08/25/2017, radiographs 06/25/2016. FINDINGS: Semi upright AP and lateral views of the chest. Diffuse reticulonodular pulmonary opacity with an upper lobe predominance, and some semi confluent areas of upper lobe involvement. No superimposed pneumothorax or pleural effusion. Mediastinal contours remain normal. Visualized tracheal air column is within normal limits. Chronic postoperative changes to the left scapula. No acute osseous abnormality identified. Negative visible bowel gas pattern. IMPRESSION: Diffuse pulmonary reticulonodular opacity with some mildly confluent areas in the bilateral upper lobes. No pleural effusion. The appearance is nonspecific. Favor acute viral/atypical respiratory infection. Characterization with Chest CT (noncontrast probably would suffice) may be valuable.  Electronically Signed   By: Genevie Ann M.D.   On: 05/02/2018 16:30   Ct Chest Wo  Contrast  Result Date: 05/23/2018 CLINICAL DATA:  Tuberculosis.  Worsening cough and fever. EXAM: CT CHEST, ABDOMEN AND PELVIS WITHOUT CONTRAST TECHNIQUE: Multidetector CT imaging of the chest, abdomen and pelvis was performed following the standard protocol without IV contrast. COMPARISON:  Chest radiograph 05/23/2018. Chest CTA 05/02/2018. CT abdomen and pelvis 05/05/2018. CONTRAST EXTRAVASATION CONSULTATION: Type of contrast:  Isovue 300 Site of extravasation: Left antecubital fossa Estimated volume of extravasation: 100 ml Area of extravasation scanned with CT? no PATIENT'S SIGNS AND SYMPTOMS Skin blistering/ulceration: no Decrease capillary refill: no Change in skin color: no Decreased motor function or severe tightness: no Decreased pulses distal to site of extravasation: no Altered sensation: no Increasing pain or signs of increased swelling during observation: no TREATMENT Observation period at site: Patient returned to the emergency department for continued observation. Orders placed for limb elevation and application of ice pack. Plastic surgery consulted? no DOCUMENTATION AND FOLLOW-UP Post extravasation orders completed? yes Patient's questions answered? yes Patient instructed to call [phone] or seek immediate medical care for new/progressive symptoms. FINDINGS: CT CHEST FINDINGS Cardiovascular: Thoracic aortic atherosclerosis without aneurysm. Normal heart size. Scattered coronary artery calcification. No significant pericardial effusion. Mediastinum/Nodes: AP window lymph nodes measure up to 12 mm in short axis, similar to the prior CT. Similar 1.4 cm short axis right hilar lymph node. Mild motion and streak artifact through the subcarinal region limits evaluation of the previously described lymph node in this location. Unremarkable thyroid. Possible mild esophageal wall thickening. Lungs/Pleura: Small right pleural effusion, new from the prior CT. No pneumothorax. Innumerable nodules are again  seen diffusely throughout both lungs, greatest in the upper lungs and less in the bases. Bilateral upper lobe airspace consolidation is greatest in the apices, overall stable to slightly progressed since the prior CT. Small areas of cavitation within the biapical consolidation have not significantly progressed. A small amount of patchy subpleural consolidation anteriorly in the right middle lobe is new from the prior CT, and patchy right middle lobe consolidation laterally has increased. There is mild dependent atelectasis in the right lower lobe. Musculoskeletal: Postsurgical changes at the left shoulder. Mild thoracic disc degeneration. No suspicious osseous lesion. CT ABDOMEN PELVIS FINDINGS The study is mildly motion degraded. Hepatobiliary: No focal liver abnormality is seen. No gallstones, gallbladder wall thickening, or biliary dilatation. Pancreas: Unremarkable. Spleen: Unremarkable. Adrenals/Urinary Tract: Unchanged right adrenal nodule. Unremarkable left adrenal gland. Unchanged 11 mm left lower pole renal hypodensity, likely a cyst. Areas of subtle hypoenhancement in both kidneys on the prior CT cannot be re-evaluated today due to lack of IV contrast. There is no hydronephrosis. Unremarkable bladder. Stomach/Bowel: The stomach is largely collapsed. Scattered fluid is present throughout the colon, and there is also a moderate-sized air-fluid level in the rectum. Fluid is also noted in nondilated small bowel loops. There is no evidence of bowel obstruction. The appendix is grossly unremarkable. Vascular/Lymphatic: Mild abdominal aortic atherosclerosis without aneurysm. No enlarged lymph nodes. Reproductive: Unremarkable prostate. Other: Trace ascites, decreased from prior. No loculated fluid collection. Persistent diffusely edematous appearance of the intra-abdominal fat. Unchanged 1.5 cm calcified mesenteric nodule in the left mid abdomen (series 5, image 76). Musculoskeletal: No suspicious osseous lesion.  Lower lumbar disc degeneration including severe disc space narrowing and spurring at L4-5 and L5-S1 resulting in moderate to severe bilateral neural foraminal stenosis. IMPRESSION: 1. Diffuse lung nodules and apical predominant lung consolidation, overall  stable to mildly progressed from 05/02/2018 and consistent with the patient's diagnosis of tuberculosis. 2. Small right pleural effusion, new from 05/02/2018. 3. Unchanged mild mediastinal and right hilar lymphadenopathy. 4. Scattered air-fluid levels in the colon and rectum suggesting a diarrheal illness. No bowel obstruction. 5. Trace ascites, decreased from prior. 6. Unchanged calcified mesenteric nodule. 7.  Aortic Atherosclerosis (ICD10-I70.0). 8. Contrast extravasation as documented above. Electronically Signed   By: Logan Bores M.D.   On: 05/23/2018 18:12   Ct Angio Chest Pe W And/or Wo Contrast  Result Date: 05/02/2018 CLINICAL DATA:  Three weeks of decreased appetite and generalized weakness. Shortness of breath with exertion. No fever. EXAM: CT ANGIOGRAPHY CHEST WITH CONTRAST TECHNIQUE: Multidetector CT imaging of the chest was performed using the standard protocol during bolus administration of intravenous contrast. Multiplanar CT image reconstructions and MIPs were obtained to evaluate the vascular anatomy. CONTRAST:  144m ISOVUE-370 IOPAMIDOL (ISOVUE-370) INJECTION 76% COMPARISON:  Chest x-ray May 02, 2018.  Chest CT September 02, 2017 FINDINGS: Cardiovascular: The thoracic aorta demonstrates atherosclerotic change with no aneurysm or dissection. Stairstep artifact is seen, particularly in the lung bases, limiting evaluation for pulmonary emboli. Taking the artifact into account, there is no convincing evidence of pulmonary emboli. Mediastinum/Nodes: The thyroid is normal. There is a prominent subcarinal node which measures 18 mm and is stable. Shotty nodes in the AP window are stable. There is a prominent right hilar node which was not well  assessed on the previous unenhanced study. Lungs/Pleura: Central airways are normal. No pneumothorax. Bilateral pulmonary infiltrates consisting of both numerous small nodules and more confluent regions. The infiltrates are most prominent in the upper lobes with cavitary rounded regions as seen on coronal image 74 measuring up to 3.6 cm in the right upper lobe, coronal image 67 in the left apex measuring up to 1.9 cm, and coronal image 71 in the left upper lobe measuring up to 1.9 cm. The right middle lobe and lower lobes are involve as well but less severely. No other acute abnormalities identified within the lung parenchyma. Upper Abdomen: Thickening of the right adrenal gland is stable, likely hyperplasia. Increased attenuation in the fat of the omentum within the left upper quadrant is new and nonspecific. Probable minimal ascites. Musculoskeletal: No chest wall abnormality. No acute or significant osseous findings. Review of the MIP images confirms the above findings. IMPRESSION: 1. Bilateral pulmonary infiltrates most prominent in the upper lobes with rounded regions of cavitation in the bilateral apices. Findings are most consistent with an infectious or inflammatory process. Atypical infections such as tuberculosis should be considered. Non tuberculous mycobacterial infections and fungal infections are also possible. Malignancy is considered less likely. Inflammatory causes such as sarcoidosis could also present with upper lobe cavitating nodules. There are a few scattered prominent nodes in the mediastinum which are stable. There is a prominent right hilar node which was not well assessed on previous unenhanced imaging. 2. Atherosclerotic change in the thoracic aorta. 3. Evaluation for pulmonary emboli is limited due to artifact but no emboli are identified. Findings called to the patient's ER doctor, Dr. PMaryan Rued Aortic Atherosclerosis (ICD10-I70.0). Electronically Signed   By: DDorise BullionIII M.D    On: 05/02/2018 18:02   Ct Abdomen Pelvis W Contrast  Result Date: 05/05/2018 CLINICAL DATA:  Abdominal pain. Fever. Abscess suspected. Unintentional weight loss. EXAM: CT ABDOMEN AND PELVIS WITH CONTRAST TECHNIQUE: Multidetector CT imaging of the abdomen and pelvis was performed using the standard protocol following bolus administration of  intravenous contrast. CONTRAST:  100 mL ISOVUE-300 IOPAMIDOL (ISOVUE-300) INJECTION 61%, 145m OMNIPAQUE IOHEXOL 300 MG/ML SOLN COMPARISON:  01/11/2018 FINDINGS: Lower chest: Lung bases show numerous centrilobular nodular opacities, new since the prior exam. There is a small area patchy ground-glass opacity the posterolateral right middle lobe. Minimal right pleural effusion. There is dependent opacity in the posterior right lower lobe adjacent to the pleural effusion that is most likely atelectasis. Hepatobiliary: Liver normal in size and attenuation with no mass or focal lesion. There is gallbladder wall edema and pericholecystic fluid. No visible gallstones. No bile duct dilation. Pancreas: Unremarkable. No pancreatic ductal dilatation or surrounding inflammatory changes. Spleen: Normal in size without focal abnormality. Adrenals/Urinary Tract: 2.6 cm right adrenal mass, stable. Normal left adrenal gland. Subtle areas of relative hypoattenuation in both kidneys, most apparent in the lateral midpole of the left kidney. These may reflect areas bright is. There are new since prior exam stable 11 mm lower pole left renal mass consistent with a cyst. No other discrete renal masses. No stones. No hydronephrosis. Ureters are normal course and in caliber. Bladder is unremarkable. Stomach/Bowel: Stomach is unremarkable. No bowel dilation or wall thickening. No convincing inflammation. Normal appendix visualized. Vascular/Lymphatic: No pathologically enlarged lymph nodes. Aortic atherosclerosis. No aneurysm. Reproductive: Unremarkable. Other: Small amount of ascites, less than was  present on the prior CT. There is no defined collection to suggest an abscess. Hazy opacities noted throughout the peritoneal and omental fat similar to the prior exam. Calcified peritoneal soft tissue mass in the left central abdomen adjacent to a loop of small bowel is stable from the prior study. Musculoskeletal: No fracture or acute finding. No osteoblastic or osteolytic lesions. IMPRESSION: 1. No evidence of an abscess. 2. There are new bilateral centrilobular pulmonary nodules. Although the differential diagnosis is relatively broad, since these are new, infection with endobronchial spread such as from non tubercular mycobacteria or aspergillosis is suspected. Hypersensitivity pneumonitis is an alternative diagnosis. This may be the source of fever. 3. Ascites, decreased when compared the prior CT. 4. Areas of subtle relative decreased attenuation in both kidneys, which could be due to pyelonephritis. No evidence of a renal or perirenal abscess. 5. Gallbladder wall thickening that is most likely nonspecific edema related to ascites. 6. No evidence of bowel inflammation. 7. Stable calcified mesenteric nodule. Electronically Signed   By: DLajean ManesM.D.   On: 05/05/2018 07:20   Dg Chest Port 1 View  Result Date: 05/23/2018 CLINICAL DATA:  Shortness of breath and fever. EXAM: PORTABLE CHEST 1 VIEW COMPARISON:  05/10/2018 and prior chest radiographs FINDINGS: Cardiomediastinal silhouette is unchanged. Bilateral airspace opacities, greatest in the UPPER lungs, have not significantly changed. No pleural effusion or pneumothorax. No acute bony abnormalities identified. IMPRESSION: Unchanged bilateral airspace opacities, UPPER lobe predominance. Electronically Signed   By: JMargarette CanadaM.D.   On: 05/23/2018 15:55   Dg Chest Port 1 View  Result Date: 05/10/2018 CLINICAL DATA:  Shortness of breath. EXAM: PORTABLE CHEST 1 VIEW COMPARISON:  Chest CT 05/02/2018 and chest radiograph 05/02/2018. FINDINGS:  Cardiomediastinal silhouette is normal. Mediastinal contours appear intact. Low lung volumes. Worsening aeration of the lungs with increasing bilateral upper lobe predominant airspace consolidation, and cavitary spaces in the upper lobes. Osseous structures are without acute abnormality. Soft tissues are grossly normal. IMPRESSION: Worsening aeration of the lungs with increasing bilateral upper lobe predominant airspace consolidation, and cavitary spaces in the upper lobes. Electronically Signed   By: DFidela SalisburyM.D.   On:  05/10/2018 09:40   Ct Bone Marrow Biopsy & Aspiration  Result Date: 05/10/2018 INDICATION: 67 year old male with sarcoidosis, thrombocytopenia an active TB. He presents for bone marrow biopsy and culture. EXAM: CT GUIDED BONE MARROW ASPIRATION AND CORE BIOPSY Interventional Radiologist:  Criselda Peaches, MD MEDICATIONS: None. ANESTHESIA/SEDATION: Moderate (conscious) sedation was employed during this procedure. A total of 1.5 milligrams versed and 50 micrograms fentanyl were administered intravenously. The patient's level of consciousness and vital signs were monitored continuously by radiology nursing throughout the procedure under my direct supervision. Total monitored sedation time: 10 minutes FLUOROSCOPY TIME:  Fluoroscopy Time: 0 minutes 0 seconds (0 mGy). COMPLICATIONS: None immediate. Estimated blood loss: <25 mL PROCEDURE: Informed written consent was obtained from the patient after a thorough discussion of the procedural risks, benefits and alternatives. All questions were addressed. Maximal Sterile Barrier Technique was utilized including caps, mask, sterile gowns, sterile gloves, sterile drape, hand hygiene and skin antiseptic. A timeout was performed prior to the initiation of the procedure. The patient was positioned prone and non-contrast localization CT was performed of the pelvis to demonstrate the iliac marrow spaces. Maximal barrier sterile technique utilized  including caps, mask, sterile gowns, sterile gloves, large sterile drape, hand hygiene, and betadine prep. Under sterile conditions and local anesthesia, an 11 gauge coaxial bone biopsy needle was advanced into the right iliac marrow space. Needle position was confirmed with CT imaging. Initially, bone marrow aspiration was performed. Next, the 11 gauge outer cannula was utilized to obtain a right iliac bone marrow core biopsy. Needle was removed. Hemostasis was obtained with compression. The patient tolerated the procedure well. Samples were prepared with the cytotechnologist. IMPRESSION: Technically successful CT-guided bone marrow biopsy, aspiration and bone culture. Signed, Criselda Peaches, MD [facility] Radiology Electronically Signed   By: Jacqulynn Cadet M.D.   On: 05/10/2018 09:53   [place] Ekg Site Rite  Result Date: 05/29/2018 If Site Rite image not attached, placement could not be confirmed due to current cardiac rhythm.  US Abdomen Limited Ruq  Result Date: 05/09/2018 CLINICAL DATA:  Abnormal LFTs EXAM: ULTRASOUND ABDOMEN LIMITED RIGHT UPPER QUADRANT COMPARISON:  CT 05/05/2018 FINDINGS: Gallbladder: Marked gallbladder wall thickening measuring up to 9 mm. No stones or sonographic Murphy's sign. Common bile duct: Diameter: Normal caliber, 4 mm Liver: No focal lesion identified. Within normal limits in parenchymal echogenicity. Portal vein is patent on color Doppler imaging with normal direction of blood flow towards the liver. Also noted is a right pleural effusion and ascites. IMPRESSION: Markedly thickened gallbladder wall. No gallstones visualized. This could be related to liver disease or cholecystitis. Right pleural effusion, mild perihepatic ascites. Electronically Signed   By: Rolm Baptise M.D.   On: 05/09/2018 08:31    Lab Data:  CBC: Recent Labs  Lab 05/23/18 1435  05/23/18 2321 05/24/18 0346 05/25/18 0639 05/26/18 0740  05/28/18 0705  WBC 19.7*  --  12.1* 11.5* 11.8* 11.5* 9.3  NEUTROABS 17.4*  --   --  10.8*  --   --   --   HGB 10.6*   < > 11.7* 11.9* 11.5* 10.2* 10.7*  HCT 32.0*   < > 36.7* 35.7* 35.0* 31.2* 31.8*  MCV 77.5*  --  81.2 77.4* 78.8 78.0 76.8*  PLT 130*  --  118* 139* 138* 136* 163   < > = values in this interval not displayed.   Basic Metabolic Panel: Recent Labs  Lab 05/23/18 1452  05/23/18 2321 05/24/18 0346 05/25/18  7482 05/26/18 0740 05/27/18 1017 05/28/18 0705  NA  --    < >  --  138 140 137 136 137  K  --    < >  --  3.7 3.6 3.5 3.8 3.3*  CL  --    < >  --  111 116* 115* 110 112*  CO2  --    < >  --  19* 19* 17* 17* 18*  GLUCOSE  --    < >  --  75 68 97 119* 136*  BUN  --    < >  --  7 7 7 6  5*  CREATININE  --    < > 0.87 0.97 0.82 0.83 0.82 0.76  CALCIUM  --    < >  --  7.2* 7.1* 6.9* 7.0* 6.8*  MG 1.4*  --  1.4*  --   --   --   --   --    < > = values in this interval not displayed.   GFR: Estimated Creatinine Clearance: 82.2 mL/min (by C-G formula based on SCr of 0.76 mg/dL). Liver Function Tests: Recent Labs  Lab 05/23/18 1435 05/24/18 0346 05/26/18 0740 05/27/18 1017 05/28/18 0705  AST 108* 115* 80* 82* 74*  ALT 33 40 30 30 26   ALKPHOS 458* 515* 403* 487* 445*  BILITOT 1.1 1.2 1.0 1.3* 1.3*  PROT 3.6* 4.6* 4.2* 4.8* 4.5*  ALBUMIN 1.3* 1.6* 1.3* 1.4* 1.3*   Recent Labs  Lab 05/23/18 1452  LIPASE 23   No results for input(s): AMMONIA in the last 168 hours. Coagulation Profile: Recent Labs  Lab 05/23/18 1435  INR 1.73   Cardiac Enzymes: Recent Labs  Lab 05/24/18 0852 05/24/18 1146 05/24/18 1528  TROPONINI 0.05* 0.03* 0.18*   BNP (last 3 results) No results for input(s): PROBNP in the last 8760 hours. HbA1C: No results for input(s): HGBA1C in the last 72 hours. CBG: Recent Labs  Lab 05/28/18 2016 05/28/18 2354 05/29/18 0432 05/29/18 0746 05/29/18 1149  GLUCAP 82 87 80 98 112*   Lipid Profile: No results for input(s): CHOL, HDL,  LDLCALC, TRIG, CHOLHDL, LDLDIRECT in the last 72 hours. Thyroid Function Tests: No results for input(s): TSH, T4TOTAL, FREET4, T3FREE, THYROIDAB in the last 72 hours. Anemia Panel: No results for input(s): VITAMINB12, FOLATE, FERRITIN, TIBC, IRON, RETICCTPCT in the last 72 hours. Urine analysis:    Component Value Date/Time   COLORURINE YELLOW 05/24/2018 0137   APPEARANCEUR CLEAR 05/24/2018 0137   LABSPEC 1.035 (H) 05/24/2018 0137   PHURINE 5.0 05/24/2018 0137   GLUCOSEU NEGATIVE 05/24/2018 0137   HGBUR NEGATIVE 05/24/2018 0137   BILIRUBINUR NEGATIVE 05/24/2018 0137   KETONESUR NEGATIVE 05/24/2018 0137   PROTEINUR NEGATIVE 05/24/2018 0137   UROBILINOGEN 0.2 02/04/2010 1718   NITRITE NEGATIVE 05/24/2018 0137   LEUKOCYTESUR NEGATIVE 05/24/2018 0137     Cambell Rickenbach M.D. [facility] 05/29/2018, 3:01 PM  Pager: [phone] Between 7am to 7pm - call Pager - [phone]  After 7pm go to [email or web address] - password [email or web address]  Call night coverage person covering after 7pm

## 2018-05-29 NOTE — Progress Notes (Signed)
ID PROGRESS NOTE   Patient has ongoing poor nutrition as well as nausea.  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 SE of medicaiton of nausea +/- intractable hiccups.  - for now recommend to do TPN via picc line for the short term - will discuss with the patient goals of care. Can concievably do PEG if not improving over the next week. - continue with RIPE plus vitamin b6

## 2018-05-29 NOTE — Progress Notes (Signed)
Peripherally Inserted Central Catheter/Midline Placement  The IV Nurse has discussed with the patient and/or persons authorized to consent for the patient, the purpose of this procedure and the potential benefits and risks involved with this procedure.  The benefits include less needle sticks, lab draws from the catheter, and the patient may be discharged home with the catheter. Risks include, but not limited to, infection, bleeding, blood clot (thrombus formation), and puncture of an artery; nerve damage and irregular heartbeat and possibility to perform a PICC exchange if needed/ordered by physician.  Alternatives to this procedure were also discussed.  Bard Power PICC patient education guide, fact sheet on infection prevention and patient information card has been provided to patient /or left at bedside. Consent obtained from wife via telephone due to patient's confusion.   PICC/Midline Placement Documentation  PICC Double Lumen 05/29/18 PICC Right Brachial 37 cm 0 cm (Active)  Indication for Insertion or Continuance of Line Administration of hyperosmolar/irritating solutions (i.e. TPN, Vancomycin, etc.) 05/29/2018  6:59 PM  Exposed Catheter (cm) 0 cm 05/29/2018  6:59 PM  Site Assessment Clean;Dry;Intact 05/29/2018  6:59 PM  Lumen #1 Status Flushed;Saline locked;Blood return noted 05/29/2018  6:59 PM  Lumen #2 Status Flushed;Saline locked;Blood return noted 05/29/2018  6:59 PM  Dressing Type Transparent 05/29/2018  6:59 PM  Dressing Status Clean;Dry;Intact;Antimicrobial disc in place 05/29/2018  6:59 PM  Dressing Intervention New dressing 05/29/2018  6:59 PM  Dressing Change Due 06/05/18 05/29/2018  6:59 PM       Aleesha Ringstad, Nicolette Bang 05/29/2018, 7:00 PM

## 2018-05-29 NOTE — Progress Notes (Signed)
Patient is complaining of heartburn unrelieved by GI [facility]. MD notified. No further orders from MD and no further complaints from patient. Will continue to monitor.

## 2018-05-30 LAB — CBC
HCT: 31.7 % — ABNORMAL LOW (ref 39.0–52.0)
Hemoglobin: 10.4 g/dL — ABNORMAL LOW (ref 13.0–17.0)
MCH: 25.2 pg — AB (ref 26.0–34.0)
MCHC: 32.8 g/dL (ref 30.0–36.0)
MCV: 76.9 fL — ABNORMAL LOW (ref 78.0–100.0)
PLATELETS: 244 10*3/uL (ref 150–400)
RBC: 4.12 MIL/uL — ABNORMAL LOW (ref 4.22–5.81)
RDW: 17.2 % — AB (ref 11.5–15.5)
WBC: 7.3 10*3/uL (ref 4.0–10.5)

## 2018-05-30 LAB — PHOSPHORUS: Phosphorus: 1.8 mg/dL — ABNORMAL LOW (ref 2.5–4.6)

## 2018-05-30 LAB — COMPREHENSIVE METABOLIC PANEL
ALBUMIN: 1.4 g/dL — AB (ref 3.5–5.0)
ALK PHOS: 572 U/L — AB (ref 38–126)
ALT: 28 U/L (ref 0–44)
AST: 80 U/L — ABNORMAL HIGH (ref 15–41)
Anion gap: 10 (ref 5–15)
BILIRUBIN TOTAL: 1.4 mg/dL — AB (ref 0.3–1.2)
BUN: <5 mg/dL — ABNORMAL LOW (ref 8–23)
CALCIUM: 7.2 mg/dL — AB (ref 8.9–10.3)
CO2: 22 mmol/L (ref 22–32)
CREATININE: 0.8 mg/dL (ref 0.61–1.24)
Chloride: 105 mmol/L (ref 98–111)
GFR calc Af Amer: >60 mL/min (ref >60)
GFR calc non Af Amer: >60 mL/min (ref >60)
GLUCOSE: 105 mg/dL — AB (ref 70–99)
Potassium: 3.3 mmol/L — ABNORMAL LOW (ref 3.5–5.1)
SODIUM: 137 mmol/L (ref 135–145)
Total Protein: 4.7 g/dL — ABNORMAL LOW (ref 6.5–8.1)

## 2018-05-30 LAB — GLUCOSE, CAPILLARY
GLUCOSE-CAPILLARY: 103 mg/dL — AB (ref 70–99)
GLUCOSE-CAPILLARY: 107 mg/dL — AB (ref 70–99)
GLUCOSE-CAPILLARY: 124 mg/dL — AB (ref 70–99)
GLUCOSE-CAPILLARY: 130 mg/dL — AB (ref 70–99)
GLUCOSE-CAPILLARY: 140 mg/dL — AB (ref 70–99)
GLUCOSE-CAPILLARY: 93 mg/dL (ref 70–99)

## 2018-05-30 LAB — MAGNESIUM: Magnesium: 1.5 mg/dL — ABNORMAL LOW (ref 1.7–2.4)

## 2018-05-30 LAB — VITAMIN B12: Vitamin B-12: 1334 pg/mL — ABNORMAL HIGH (ref 180–914)

## 2018-05-30 LAB — AMMONIA: Ammonia: 42 umol/L — ABNORMAL HIGH (ref 9–35)

## 2018-05-30 MED ORDER — POTASSIUM CHLORIDE CRYS ER 20 MEQ PO TBCR
40.0000 meq | EXTENDED_RELEASE_TABLET | Freq: Once | ORAL | Status: AC
  Administered 2018-05-30: 40 meq via ORAL
  Filled 2018-05-30: qty 2

## 2018-05-30 MED ORDER — MAGNESIUM OXIDE 400 (241.3 MG) MG PO TABS
400.0000 mg | ORAL_TABLET | Freq: Every day | ORAL | Status: DC
  Filled 2018-05-30: qty 1

## 2018-05-30 MED ORDER — MAGNESIUM SULFATE 50 % IJ SOLN
4.0000 g | Freq: Once | INTRAVENOUS | Status: DC
  Filled 2018-05-30: qty 8

## 2018-05-30 MED ORDER — PROCHLORPERAZINE MALEATE 5 MG PO TABS
5.0000 mg | ORAL_TABLET | Freq: Three times a day (TID) | ORAL | Status: DC
  Administered 2018-05-30 – 2018-06-05 (×17): 5 mg via ORAL
  Filled 2018-05-30 (×22): qty 1

## 2018-05-30 MED ORDER — MAGNESIUM OXIDE 400 (241.3 MG) MG PO TABS
400.0000 mg | ORAL_TABLET | Freq: Every day | ORAL | Status: DC
Start: 2018-05-30 — End: 2018-06-05
  Administered 2018-05-30 – 2018-06-05 (×7): 400 mg via ORAL
  Filled 2018-05-30 (×6): qty 1

## 2018-05-30 MED ORDER — POTASSIUM & SODIUM PHOSPHATES 280-160-250 MG PO PACK
2.0000 | PACK | Freq: Once | ORAL | Status: AC
  Administered 2018-05-30: 2 via ORAL
  Filled 2018-05-30: qty 2

## 2018-05-30 MED ORDER — POTASSIUM & SODIUM PHOSPHATES 280-160-250 MG PO PACK
1.0000 | PACK | Freq: Once | ORAL | Status: AC
  Administered 2018-05-31: 1 via ORAL
  Filled 2018-05-30: qty 1

## 2018-05-30 NOTE — Progress Notes (Signed)
[facility]    Date of Admission:  05/23/2018      ID: LOGUN COLAVITO is a 67 y.o. male with disseminated mTB with sarcoidosis, failure to thrive, severe protein caloric malnutrition Principal Problem:   Sepsis ([place]) Active Problems:   Diabetes mellitus without complication ([place])   Sarcoidosis   Hypertension   Thrombocytopenia ([place])   Hyponatremia   Pulmonary tuberculosis   Diarrhea   Pressure injury of skin   Hypoglycemia    Subjective: Had episode of confusion last night pulled out picc line that was placed yesterday for TPN, pulled rectal tube, and leads. This afternoon has not had further diarrhea per his RN report. He is sleepy this afternoon. Was unable to talk to his wife who is usually at his bedside. He didn't appear to understand my discussion regarding improving his oral intake, considering tube feeds via dobhoff.  Medications:  . baclofen  5 mg Oral TID  . cholestyramine light  4 g Oral BID AC  . diphenoxylate-atropine  1 tablet Oral QID  . enoxaparin (LOVENOX) injection  40 mg Subcutaneous Q24H  . ethambutol  1,200 mg Oral Daily  . feeding supplement  1 Container Oral TID BM  . isoniazid  300 mg Oral Daily  . magnesium oxide  400 mg Oral Daily  . multivitamin with minerals  1 tablet Oral Daily  . potassium & sodium phosphates  1 packet Oral Once  . prochlorperazine  5 mg Oral TID AC  . pyrazinamide  1,500 mg Oral Daily  . pyridOXINE  50 mg Oral Daily  . rifampin  600 mg Oral Daily    Objective: Vital signs in last 24 hours: Temp:  [97.6 F (36.4 C)-99.7 F (37.6 C)] 99.7 F (37.6 C) (06/25 2041) Pulse Rate:  [104-123] 104 (06/25 2041) Resp:  [16-20] 20 (06/25 2041) BP: (124-137)/(81-95) 130/91 (06/25 2041) SpO2:  [94 %-99 %] 97 % (06/25 2041) Weight:  [143 lb 4.8 oz (65 kg)] 143 lb 4.8 oz (65 kg) (06/25 2041) Physical Exam  Constitutional: He is oriented to person, place.. He appears chronically ill, wasted, mal-nourished.  No distress.  HENT:  Mouth/Throat: Oropharynx is clear and moist. No oropharyngeal exudate.  Cardiovascular: Normal rate, regular rhythm and normal heart sounds. Exam reveals no gallop and no friction rub.  No murmur heard.  Pulmonary/Chest: Effort normal and breath sounds normal. No respiratory distress. He has no wheezes.  Abdominal: Soft. Bowel sounds are normal. He exhibits no distension. There is no tenderness.  Neurological: He is alert and oriented to person, place, and time.  Skin: Skin is warm and dry. No rash noted. No erythema.  Psychiatric: He is calm and sleepy   Lab Results Recent Labs    05/28/18 0705 05/30/18 0530  WBC 9.3 7.3  HGB 10.7* 10.4*  HCT 31.8* 31.7*  NA 137 137  K 3.3* 3.3*  CL 112* 105  CO2 18* 22  BUN 5* <5*  CREATININE 0.76 0.80   Liver Panel Recent Labs    05/28/18 0705 05/30/18 0530  PROT 4.5* 4.7*  ALBUMIN 1.3* 1.4*  AST 74* 80*  ALT 26 28  ALKPHOS 445* 572*  BILITOT 1.3* 1.4*    Microbiology: reviewed Studies/Results: [place] Ekg Site Rite  Result Date: 05/29/2018 If Site Rite image not attached, placement could not be confirmed due to current cardiac rhythm.    Assessment/Plan: Need to address goals of care with patient and his wife and discuss what will  be next course of action. Predominantly we will need to address his nutritional status  - option to is to do a bridled ng/dobhoff tube to tube feeds and watch for refeeding syndrome - if tolerates - and then discussion for PEG would be the next steps. Can also give meds through PEG. It is not unreasonable to do this approach if the patient is in accordance with this plan.   Diarrhea = resolved with lomotil. May need to back off to TID dosing and start tapering off to PRN use as the goal  Disseminated mtB = continue with RIPE plus vit b6. Appears to tolerate. Can consider doing a trial of switching Rifampin to IV to see if it makes a difference in symptoms. He is somewhat a poor  historian. Can't tell if it causing significant nausea.  Agree with palliative care consultation.  [facility] Cell: [phone] Pager: [phone]  05/30/2018, 11:38 PM

## 2018-05-30 NOTE — Care Management Important Message (Addendum)
Important Message  Patient Details  Name: AMANDA POTE MRN: [id number] Date of Birth: 22-Oct-1951   Medicare Important Message Given:  Yes  Left at bedside    Carles Collet, RN 05/30/2018, 1:26 PM

## 2018-05-30 NOTE — Progress Notes (Addendum)
[facility]                                                                              Patient Demographics  Jeffrey Hayes, is a 67 y.o. male, DOB - 20-May-1951, ZOX:[id number]  Admit date - 05/23/2018   Admitting Physician Rise Patience, MD  Outpatient Primary MD for the patient is [name], MD  Outpatient specialists:   LOS - 7  days   Medical records reviewed and are as summarized below:    Chief Complaint  Patient presents with  . Chest Pain  . Fever  . Diarrhea       Brief summary   Jeffrey Hayes is a 67 y.o. male with recently diagnosed pulmonary tuberculosis discharged home on antituberculosis medications presents to the ER with complaints of persistent nausea vomiting and diarrhea over the last few days.  Also has been having hiccups.  Has been feeling weak.  Has not been able to eat anything because of the vomiting.  Patient has been compliant with his anti-tuberculosis medications.  In ED, patient was febrile, tachycardia, lactic acidosis, hypokalemia, hypomagnesemia. CT chest abdomen and pelvis showed fused lung nodules and apical predominant lung consolidation, small right pleural effusion, scattered air-fluid levels in the colon and rectum suggesting viral illness, no bowel obstruction.      Assessment & Plan    Principal Problem:   Sepsis ([place]), bilateral cavitary pneumonia, active tuberculosis, in the setting of sarcoidosis -ID following, recommended to continue [place] anti TB meds with vitamin B6  Active Problems: Nausea, vomiting, diarrhea, failure to thrive -Possibly due to medications, CT abdomen showed no obstruction or acute abdominal pathology -C. difficile negative, GI pathogen panel negative -Continue scheduled Zofran, Phenergan as needed, cholestyramine -Difficult situation, PICC line was placed yesterday for TPN short-term until his nutritional status improves however patient pulled the PICC line in confusion.   Restraints were placed overnight. -Palliative medicine consulted for goals of care   Transaminitis with obstructive pattern -Possibly due to active TB and medications Patient had-right upper quadrant ultrasound on 05/09/2018 which showed no gallstones however markedly thickened gallbladder wall could be related to liver disease or cholecystitis. -CT abdomen pelvis on admission showed no gallstones, gallbladder wall thickening or biliary dilatation, no focal liver abnormality. 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, sarcoidosis:  -Platelet count stable, improving -Patient underwent bone marrow biopsy during the previous admission which showed normocellular marrow with numerous granulomata  Type 2 diabetes mellitus -CBG stable, continue sliding scale insulin  -Hemoglobin A1c 11.8 -Amaryl and metformin were discontinued during the previous admission  Essential hypertension -BP currently stable  Severe protein calorie malnutrition with hypoalbuminemia -Nutrition consulted, albumin 1.6  -Patient pulled PICC line during confusion which was placed for TPN, palliative consulted for goals of care if patient or family agrees for PEG tube  Mildly elevated troponin -Likely due to #1, no chest pain -2D echo showed EF of 60 to 65%, no diagnostic regional wall motion abnormalities.  Acute metabolic encephalopathy -Unclear etiology, will need CT head however patient under airborne precautions -Will replace potassium, magnesium, obtain ammonia level, B1, B12 -Continue restraints  Code Status: Full CODE STATUS DVT Prophylaxis:  Lovenox Family Communication: discussed in detail above labs, management, goals of care with patient's wife today.   Disposition Plan:   Time Spent in minutes  25 minutes  Procedures:  2D echo  Consultants:   Infectious disease  Antimicrobials:      Medications  Scheduled Meds: . baclofen  5 mg Oral TID  . cholestyramine light  4 g Oral  BID AC  . diphenoxylate-atropine  1 tablet Oral QID  . enoxaparin (LOVENOX) injection  40 mg Subcutaneous Q24H  . ethambutol  1,200 mg Oral Daily  . feeding supplement  1 Container Oral TID BM  . isoniazid  300 mg Oral Daily  . multivitamin with minerals  1 tablet Oral Daily  . ondansetron (ZOFRAN) IV  4 mg Intravenous TID AC  . pyrazinamide  1,500 mg Oral Daily  . pyridOXINE  50 mg Oral Daily  . rifampin  600 mg Oral Daily   Continuous Infusions: . sodium chloride 10 mL/hr at 05/25/18 1311  . sodium chloride 10 mL/hr at 05/28/18 1551   PRN Meds:.acetaminophen **OR** acetaminophen, gi cocktail, HYDROmorphone (DILAUDID) injection, loperamide, promethazine, sodium chloride flush   Antibiotics   Anti-infectives (From admission, onward)   Start     Dose/Rate Route Frequency Ordered Stop   05/24/18 1000  ethambutol (MYAMBUTOL) tablet 1,200 mg     1,200 mg Oral Daily 05/23/18 2104     05/24/18 1000  isoniazid (NYDRAZID) tablet 300 mg     300 mg Oral Daily 05/23/18 2104     05/24/18 1000  pyrazinamide tablet 1,500 mg     1,500 mg Oral Daily 05/23/18 2104     05/24/18 1000  rifampin (RIFADIN) capsule 600 mg     600 mg Oral Daily 05/23/18 2104     05/23/18 2330  piperacillin-tazobactam (ZOSYN) IVPB 3.375 g  Status:  Discontinued     3.375 g 12.5 mL/hr over 240 Minutes Intravenous Every 8 hours 05/23/18 1529 05/25/18 1237   05/23/18 2330  vancomycin (VANCOCIN) IVPB 750 mg/150 ml premix  Status:  Discontinued     750 mg 150 mL/hr over 60 Minutes Intravenous Every 8 hours 05/23/18 1530 05/23/18 2104   05/23/18 1530  piperacillin-tazobactam (ZOSYN) IVPB 3.375 g     3.375 g 100 mL/hr over 30 Minutes Intravenous  Once 05/23/18 1515 05/23/18 1625   05/23/18 1530  vancomycin (VANCOCIN) IVPB 1000 mg/200 mL premix  Status:  Discontinued     1,000 mg 200 mL/hr over 60 Minutes Intravenous  Once 05/23/18 1515 05/23/18 1528   05/23/18 1530  vancomycin (VANCOCIN) 1,500 mg in sodium chloride 0.9 %  500 mL IVPB     1,500 mg 250 mL/hr over 120 Minutes Intravenous  Once 05/23/18 1528 05/23/18 1807        Subjective:   Jeffrey Hayes was seen and examined today.  Overnight confused, placed on mittens however pulled PICC line.  This morning oriented to self and place however still somewhat confused.  Poor oral intake.  Diarrhea.    Objective:   Vitals:   05/29/18 1557 05/29/18 2029 05/30/18 0426 05/30/18 0852  BP: 126/90 116/90 (!) 137/95 124/81  Pulse: (!) 113 (!) 117 (!) 123 (!) 116  Resp: 20 18 16 18   Temp: 98.3 F (36.8 C) 98.4 F (36.9 C) 98.9 F (37.2 C) 97.6 F (36.4 C)  TempSrc: Oral Oral  Oral  SpO2: 96% 97% 99% 94%  Weight:  64 kg (141 lb 1.5  oz)    Height:        Intake/Output Summary (Last 24 hours) at 05/30/2018 1120 Last data filed at 05/30/2018 0925 Gross per 24 hour  Intake 460 ml  Output 540 ml  Net -80 ml     Wt Readings from Last 3 Encounters:  05/29/18 64 kg (141 lb 1.5 oz)  05/14/18 68.7 kg (151 lb 7.3 oz)  03/11/18 75.3 kg (166 lb)     Exam   General: Alert and oriented x 2, self and place somewhat confused  Eyes:  HEENT  Cardiovascular: S1 S2 auscultated,Regular rate and rhythm. No pedal edema b/l  Respiratory: Clear to auscultation bilaterally, no wheezing, rales or rhonchi  Gastrointestinal: Soft, nontender, nondistended, + bowel sounds  Ext: no pedal edema bilaterally  Neuro: no focal neurological deficits, moving all 4 extremities  Musculoskeletal: No digital cyanosis, clubbing  Skin: No rashes  Psych: confused   Data Reviewed:  I have personally reviewed following labs and imaging studies  Micro Results Recent Results (from the past 240 hour(s))  Culture, blood (Routine x 2)     Status: None   Collection Time: 05/23/18  2:53 PM  Result Value Ref Range Status   Specimen Description BLOOD RIGHT ANTECUBITAL  Final   Special Requests   Final    BOTTLES DRAWN AEROBIC AND ANAEROBIC Blood Culture adequate volume    Culture   Final    NO GROWTH 5 DAYS Performed at [facility], [address] [place], [place] [address]    Report Status 05/28/2018 FINAL  Final  Culture, blood (Routine x 2)     Status: None   Collection Time: 05/23/18  3:43 PM  Result Value Ref Range Status   Specimen Description BLOOD BLOOD LEFT FOREARM  Final   Special Requests   Final    BOTTLES DRAWN AEROBIC ONLY Blood Culture results may not be optimal due to an inadequate volume of blood received in culture bottles   Culture   Final    NO GROWTH 5 DAYS Performed at [facility], [place] [address] [place], [place] [address]    Report Status 05/28/2018 FINAL  Final  Gastrointestinal Panel by PCR , Stool     Status: None   Collection Time: 05/23/18 10:45 PM  Result Value Ref Range Status   Campylobacter species NOT DETECTED NOT DETECTED Final   Plesimonas shigelloides NOT DETECTED NOT DETECTED Final   Salmonella species NOT DETECTED NOT DETECTED Final   Yersinia enterocolitica NOT DETECTED NOT DETECTED Final   Vibrio species NOT DETECTED NOT DETECTED Final   Vibrio cholerae NOT DETECTED NOT DETECTED Final   Enteroaggregative E coli (EAEC) NOT DETECTED NOT DETECTED Final   Enteropathogenic E coli (EPEC) NOT DETECTED NOT DETECTED Final   Enterotoxigenic E coli (ETEC) NOT DETECTED NOT DETECTED Final   Shiga like toxin producing E coli (STEC) NOT DETECTED NOT DETECTED Final   Shigella/Enteroinvasive E coli (EIEC) NOT DETECTED NOT DETECTED Final   Cryptosporidium NOT DETECTED NOT DETECTED Final   Cyclospora cayetanensis NOT DETECTED NOT DETECTED Final   Entamoeba histolytica NOT DETECTED NOT DETECTED Final   Giardia lamblia NOT DETECTED NOT DETECTED Final   Adenovirus F40/41 NOT DETECTED NOT DETECTED Final   Astrovirus NOT DETECTED NOT DETECTED Final   Norovirus GI/GII NOT DETECTED NOT DETECTED Final   Rotavirus A NOT DETECTED NOT DETECTED Final   Sapovirus (I, II, IV, and V) NOT DETECTED NOT DETECTED Final     Comment: Performed  at [facility], [address] [place], [place] [address]  C difficile quick scan w PCR reflex     Status: None   Collection Time: 05/23/18 11:00 PM  Result Value Ref Range Status   C Diff antigen NEGATIVE NEGATIVE Final   C Diff toxin NEGATIVE NEGATIVE Final   C Diff interpretation No C. difficile detected.  Final    Comment: Performed at [facility], [place] [address] [place], [place] [address]  Urine culture     Status: None   Collection Time: 05/24/18  1:37 AM  Result Value Ref Range Status   Specimen Description URINE, CLEAN CATCH  Final   Special Requests NONE  Final   Culture   Final    NO GROWTH Performed at [facility], [place] [address], [place], [place] [address]    Report Status 05/25/2018 FINAL  Final  Culture, blood (routine x 2)     Status: None (Preliminary result)   Collection Time: 05/27/18  2:25 PM  Result Value Ref Range Status   Specimen Description BLOOD LEFT ANTECUBITAL  Final   Special Requests   Final    BOTTLES DRAWN AEROBIC AND ANAEROBIC Blood Culture adequate volume   Culture   Final    NO GROWTH 3 DAYS Performed at [facility], [place] [address] [place], [place] [address]    Report Status PENDING  Incomplete  Culture, blood (routine x 2)     Status: None (Preliminary result)   Collection Time: 05/27/18  2:39 PM  Result Value Ref Range Status   Specimen Description BLOOD LEFT HAND  Final   Special Requests   Final    BOTTLES DRAWN AEROBIC AND ANAEROBIC Blood Culture adequate volume   Culture   Final    NO GROWTH 3 DAYS Performed at [facility], [place] [address] [place], [place] [address]    Report Status PENDING  Incomplete    Radiology Reports Ct Abdomen Pelvis Wo Contrast  Result Date: 05/23/2018 CLINICAL DATA:  Tuberculosis.  Worsening cough and fever. EXAM: CT CHEST, ABDOMEN AND PELVIS WITHOUT CONTRAST TECHNIQUE: Multidetector CT imaging of the chest, abdomen and pelvis was performed  following the standard protocol without IV contrast. COMPARISON:  Chest radiograph 05/23/2018. Chest CTA 05/02/2018. CT abdomen and pelvis 05/05/2018. CONTRAST EXTRAVASATION CONSULTATION: Type of contrast:  Isovue 300 Site of extravasation: Left antecubital fossa Estimated volume of extravasation: 100 ml Area of extravasation scanned with CT? no PATIENT'S SIGNS AND SYMPTOMS Skin blistering/ulceration: no Decrease capillary refill: no Change in skin color: no Decreased motor function or severe tightness: no Decreased pulses distal to site of extravasation: no Altered sensation: no Increasing pain or signs of increased swelling during observation: no TREATMENT Observation period at site: Patient returned to the emergency department for continued observation. Orders placed for limb elevation and application of ice pack. Plastic surgery consulted? no DOCUMENTATION AND FOLLOW-UP Post extravasation orders completed? yes Patient's questions answered? yes Patient instructed to call [phone] or seek immediate medical care for new/progressive symptoms. FINDINGS: CT CHEST FINDINGS Cardiovascular: Thoracic aortic atherosclerosis without aneurysm. Normal heart size. Scattered coronary artery calcification. No significant pericardial effusion. Mediastinum/Nodes: AP window lymph nodes measure up to 12 mm in short axis, similar to the prior CT. Similar 1.4 cm short axis right hilar lymph node. Mild motion and streak artifact through the subcarinal region limits evaluation of the previously described lymph node in this location. Unremarkable thyroid. Possible mild esophageal wall thickening. Lungs/Pleura: Small  right pleural effusion, new from the prior CT. No pneumothorax. Innumerable nodules are again seen diffusely throughout both lungs, greatest in the upper lungs and less in the bases. Bilateral upper lobe airspace consolidation is greatest in the apices, overall stable to slightly progressed since the prior CT. Small areas  of cavitation within the biapical consolidation have not significantly progressed. A small amount of patchy subpleural consolidation anteriorly in the right middle lobe is new from the prior CT, and patchy right middle lobe consolidation laterally has increased. There is mild dependent atelectasis in the right lower lobe. Musculoskeletal: Postsurgical changes at the left shoulder. Mild thoracic disc degeneration. No suspicious osseous lesion. CT ABDOMEN PELVIS FINDINGS The study is mildly motion degraded. Hepatobiliary: No focal liver abnormality is seen. No gallstones, gallbladder wall thickening, or biliary dilatation. Pancreas: Unremarkable. Spleen: Unremarkable. Adrenals/Urinary Tract: Unchanged right adrenal nodule. Unremarkable left adrenal gland. Unchanged 11 mm left lower pole renal hypodensity, likely a cyst. Areas of subtle hypoenhancement in both kidneys on the prior CT cannot be re-evaluated today due to lack of IV contrast. There is no hydronephrosis. Unremarkable bladder. Stomach/Bowel: The stomach is largely collapsed. Scattered fluid is present throughout the colon, and there is also a moderate-sized air-fluid level in the rectum. Fluid is also noted in nondilated small bowel loops. There is no evidence of bowel obstruction. The appendix is grossly unremarkable. Vascular/Lymphatic: Mild abdominal aortic atherosclerosis without aneurysm. No enlarged lymph nodes. Reproductive: Unremarkable prostate. Other: Trace ascites, decreased from prior. No loculated fluid collection. Persistent diffusely edematous appearance of the intra-abdominal fat. Unchanged 1.5 cm calcified mesenteric nodule in the left mid abdomen (series 5, image 76). Musculoskeletal: No suspicious osseous lesion. Lower lumbar disc degeneration including severe disc space narrowing and spurring at L4-5 and L5-S1 resulting in moderate to severe bilateral neural foraminal stenosis. IMPRESSION: 1. Diffuse lung nodules and apical predominant  lung consolidation, overall stable to mildly progressed from 05/02/2018 and consistent with the patient's diagnosis of tuberculosis. 2. Small right pleural effusion, new from 05/02/2018. 3. Unchanged mild mediastinal and right hilar lymphadenopathy. 4. Scattered air-fluid levels in the colon and rectum suggesting a diarrheal illness. No bowel obstruction. 5. Trace ascites, decreased from prior. 6. Unchanged calcified mesenteric nodule. 7.  Aortic Atherosclerosis (ICD10-I70.0). 8. Contrast extravasation as documented above. Electronically Signed   By: Logan Bores M.D.   On: 05/23/2018 18:12   Dg Chest 1 View  Result Date: 05/27/2018 CLINICAL DATA:  Encounter for fever and chills. EXAM: CHEST  1 VIEW COMPARISON:  Chest CT, 05/23/2018. Chest radiographs, most recent 05/23/2018. FINDINGS: Multiple bilateral small lung nodules with hazy airspace type lung opacity, most evident in the upper lobes, is without significant change. No new lung abnormalities. Heart is normal in size.  No mediastinal or hilar masses. No convincing pleural effusion.  No pneumothorax. IMPRESSION: 1. No significant change in the previously described bilateral lung opacities and numerous bilateral lung nodules. 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 Electronically Signed   By: Lajean Manes M.D.   On: 05/27/2018 13:25   Dg Chest 2 View  Result Date: 05/02/2018 CLINICAL DATA:  67 year old male with cirrhosis. Weakness and loss of appetite. EXAM: CHEST - 2 VIEW COMPARISON:  Chest CT 08/25/2017, radiographs 06/25/2016. FINDINGS: Semi upright AP and lateral views of the chest. Diffuse reticulonodular pulmonary opacity with an upper lobe predominance, and some semi confluent areas of upper lobe involvement. No superimposed pneumothorax or pleural effusion. Mediastinal contours remain normal. Visualized tracheal air column is within normal limits.  Chronic postoperative changes to the left scapula. No acute osseous abnormality  identified. Negative visible bowel gas pattern. IMPRESSION: Diffuse pulmonary reticulonodular opacity with some mildly confluent areas in the bilateral upper lobes. No pleural effusion. The appearance is nonspecific. Favor acute viral/atypical respiratory infection. Characterization with Chest CT (noncontrast probably would suffice) may be valuable. Electronically Signed   By: Genevie Ann M.D.   On: 05/02/2018 16:30   Ct Chest Wo Contrast  Result Date: 05/23/2018 CLINICAL DATA:  Tuberculosis.  Worsening cough and fever. EXAM: CT CHEST, ABDOMEN AND PELVIS WITHOUT CONTRAST TECHNIQUE: Multidetector CT imaging of the chest, abdomen and pelvis was performed following the standard protocol without IV contrast. COMPARISON:  Chest radiograph 05/23/2018. Chest CTA 05/02/2018. CT abdomen and pelvis 05/05/2018. CONTRAST EXTRAVASATION CONSULTATION: Type of contrast:  Isovue 300 Site of extravasation: Left antecubital fossa Estimated volume of extravasation: 100 ml Area of extravasation scanned with CT? no PATIENT'S SIGNS AND SYMPTOMS Skin blistering/ulceration: no Decrease capillary refill: no Change in skin color: no Decreased motor function or severe tightness: no Decreased pulses distal to site of extravasation: no Altered sensation: no Increasing pain or signs of increased swelling during observation: no TREATMENT Observation period at site: Patient returned to the emergency department for continued observation. Orders placed for limb elevation and application of ice pack. Plastic surgery consulted? no DOCUMENTATION AND FOLLOW-UP Post extravasation orders completed? yes Patient's questions answered? yes Patient instructed to call [phone] or seek immediate medical care for new/progressive symptoms. FINDINGS: CT CHEST FINDINGS Cardiovascular: Thoracic aortic atherosclerosis without aneurysm. Normal heart size. Scattered coronary artery calcification. No significant pericardial effusion. Mediastinum/Nodes: AP window lymph  nodes measure up to 12 mm in short axis, similar to the prior CT. Similar 1.4 cm short axis right hilar lymph node. Mild motion and streak artifact through the subcarinal region limits evaluation of the previously described lymph node in this location. Unremarkable thyroid. Possible mild esophageal wall thickening. Lungs/Pleura: Small right pleural effusion, new from the prior CT. No pneumothorax. Innumerable nodules are again seen diffusely throughout both lungs, greatest in the upper lungs and less in the bases. Bilateral upper lobe airspace consolidation is greatest in the apices, overall stable to slightly progressed since the prior CT. Small areas of cavitation within the biapical consolidation have not significantly progressed. A small amount of patchy subpleural consolidation anteriorly in the right middle lobe is new from the prior CT, and patchy right middle lobe consolidation laterally has increased. There is mild dependent atelectasis in the right lower lobe. Musculoskeletal: Postsurgical changes at the left shoulder. Mild thoracic disc degeneration. No suspicious osseous lesion. CT ABDOMEN PELVIS FINDINGS The study is mildly motion degraded. Hepatobiliary: No focal liver abnormality is seen. No gallstones, gallbladder wall thickening, or biliary dilatation. Pancreas: Unremarkable. Spleen: Unremarkable. Adrenals/Urinary Tract: Unchanged right adrenal nodule. Unremarkable left adrenal gland. Unchanged 11 mm left lower pole renal hypodensity, likely a cyst. Areas of subtle hypoenhancement in both kidneys on the prior CT cannot be re-evaluated today due to lack of IV contrast. There is no hydronephrosis. Unremarkable bladder. Stomach/Bowel: The stomach is largely collapsed. Scattered fluid is present throughout the colon, and there is also a moderate-sized air-fluid level in the rectum. Fluid is also noted in nondilated small bowel loops. There is no evidence of bowel obstruction. The appendix is grossly  unremarkable. Vascular/Lymphatic: Mild abdominal aortic atherosclerosis without aneurysm. No enlarged lymph nodes. Reproductive: Unremarkable prostate. Other: Trace ascites, decreased from prior. No loculated fluid collection. Persistent diffusely edematous appearance of the intra-abdominal fat.  Unchanged 1.5 cm calcified mesenteric nodule in the left mid abdomen (series 5, image 76). Musculoskeletal: No suspicious osseous lesion. Lower lumbar disc degeneration including severe disc space narrowing and spurring at L4-5 and L5-S1 resulting in moderate to severe bilateral neural foraminal stenosis. IMPRESSION: 1. Diffuse lung nodules and apical predominant lung consolidation, overall stable to mildly progressed from 05/02/2018 and consistent with the patient's diagnosis of tuberculosis. 2. Small right pleural effusion, new from 05/02/2018. 3. Unchanged mild mediastinal and right hilar lymphadenopathy. 4. Scattered air-fluid levels in the colon and rectum suggesting a diarrheal illness. No bowel obstruction. 5. Trace ascites, decreased from prior. 6. Unchanged calcified mesenteric nodule. 7.  Aortic Atherosclerosis (ICD10-I70.0). 8. Contrast extravasation as documented above. Electronically Signed   By: Logan Bores M.D.   On: 05/23/2018 18:12   Ct Angio Chest Pe W And/or Wo Contrast  Result Date: 05/02/2018 CLINICAL DATA:  Three weeks of decreased appetite and generalized weakness. Shortness of breath with exertion. No fever. EXAM: CT ANGIOGRAPHY CHEST WITH CONTRAST TECHNIQUE: Multidetector CT imaging of the chest was performed using the standard protocol during bolus administration of intravenous contrast. Multiplanar CT image reconstructions and MIPs were obtained to evaluate the vascular anatomy. CONTRAST:  138m ISOVUE-370 IOPAMIDOL (ISOVUE-370) INJECTION 76% COMPARISON:  Chest x-ray May 02, 2018.  Chest CT September 02, 2017 FINDINGS: Cardiovascular: The thoracic aorta demonstrates atherosclerotic change with  no aneurysm or dissection. Stairstep artifact is seen, particularly in the lung bases, limiting evaluation for pulmonary emboli. Taking the artifact into account, there is no convincing evidence of pulmonary emboli. Mediastinum/Nodes: The thyroid is normal. There is a prominent subcarinal node which measures 18 mm and is stable. Shotty nodes in the AP window are stable. There is a prominent right hilar node which was not well assessed on the previous unenhanced study. Lungs/Pleura: Central airways are normal. No pneumothorax. Bilateral pulmonary infiltrates consisting of both numerous small nodules and more confluent regions. The infiltrates are most prominent in the upper lobes with cavitary rounded regions as seen on coronal image 74 measuring up to 3.6 cm in the right upper lobe, coronal image 67 in the left apex measuring up to 1.9 cm, and coronal image 71 in the left upper lobe measuring up to 1.9 cm. The right middle lobe and lower lobes are involve as well but less severely. No other acute abnormalities identified within the lung parenchyma. Upper Abdomen: Thickening of the right adrenal gland is stable, likely hyperplasia. Increased attenuation in the fat of the omentum within the left upper quadrant is new and nonspecific. Probable minimal ascites. Musculoskeletal: No chest wall abnormality. No acute or significant osseous findings. Review of the MIP images confirms the above findings. IMPRESSION: 1. Bilateral pulmonary infiltrates most prominent in the upper lobes with rounded regions of cavitation in the bilateral apices. Findings are most consistent with an infectious or inflammatory process. Atypical infections such as tuberculosis should be considered. Non tuberculous mycobacterial infections and fungal infections are also possible. Malignancy is considered less likely. Inflammatory causes such as sarcoidosis could also present with upper lobe cavitating nodules. There are a few scattered prominent  nodes in the mediastinum which are stable. There is a prominent right hilar node which was not well assessed on previous unenhanced imaging. 2. Atherosclerotic change in the thoracic aorta. 3. Evaluation for pulmonary emboli is limited due to artifact but no emboli are identified. Findings called to the patient's ER doctor, Dr. PMaryan Rued Aortic Atherosclerosis (ICD10-I70.0). Electronically Signed   By: DDorise Bullion  III M.D   On: 05/02/2018 18:02   Ct Abdomen Pelvis W Contrast  Result Date: 05/05/2018 CLINICAL DATA:  Abdominal pain. Fever. Abscess suspected. Unintentional weight loss. EXAM: CT ABDOMEN AND PELVIS WITH CONTRAST TECHNIQUE: Multidetector CT imaging of the abdomen and pelvis was performed using the standard protocol following bolus administration of intravenous contrast. CONTRAST:  100 mL ISOVUE-300 IOPAMIDOL (ISOVUE-300) INJECTION 61%, 173m OMNIPAQUE IOHEXOL 300 MG/ML SOLN COMPARISON:  01/11/2018 FINDINGS: Lower chest: Lung bases show numerous centrilobular nodular opacities, new since the prior exam. There is a small area patchy ground-glass opacity the posterolateral right middle lobe. Minimal right pleural effusion. There is dependent opacity in the posterior right lower lobe adjacent to the pleural effusion that is most likely atelectasis. Hepatobiliary: Liver normal in size and attenuation with no mass or focal lesion. There is gallbladder wall edema and pericholecystic fluid. No visible gallstones. No bile duct dilation. Pancreas: Unremarkable. No pancreatic ductal dilatation or surrounding inflammatory changes. Spleen: Normal in size without focal abnormality. Adrenals/Urinary Tract: 2.6 cm right adrenal mass, stable. Normal left adrenal gland. Subtle areas of relative hypoattenuation in both kidneys, most apparent in the lateral midpole of the left kidney. These may reflect areas bright is. There are new since prior exam stable 11 mm lower pole left renal mass consistent with a cyst. No  other discrete renal masses. No stones. No hydronephrosis. Ureters are normal course and in caliber. Bladder is unremarkable. Stomach/Bowel: Stomach is unremarkable. No bowel dilation or wall thickening. No convincing inflammation. Normal appendix visualized. Vascular/Lymphatic: No pathologically enlarged lymph nodes. Aortic atherosclerosis. No aneurysm. Reproductive: Unremarkable. Other: Small amount of ascites, less than was present on the prior CT. There is no defined collection to suggest an abscess. Hazy opacities noted throughout the peritoneal and omental fat similar to the prior exam. Calcified peritoneal soft tissue mass in the left central abdomen adjacent to a loop of small bowel is stable from the prior study. Musculoskeletal: No fracture or acute finding. No osteoblastic or osteolytic lesions. IMPRESSION: 1. No evidence of an abscess. 2. There are new bilateral centrilobular pulmonary nodules. Although the differential diagnosis is relatively broad, since these are new, infection with endobronchial spread such as from non tubercular mycobacteria or aspergillosis is suspected. Hypersensitivity pneumonitis is an alternative diagnosis. This may be the source of fever. 3. Ascites, decreased when compared the prior CT. 4. Areas of subtle relative decreased attenuation in both kidneys, which could be due to pyelonephritis. No evidence of a renal or perirenal abscess. 5. Gallbladder wall thickening that is most likely nonspecific edema related to ascites. 6. No evidence of bowel inflammation. 7. Stable calcified mesenteric nodule. Electronically Signed   By: DLajean ManesM.D.   On: 05/05/2018 07:20   Dg Chest Port 1 View  Result Date: 05/23/2018 CLINICAL DATA:  Shortness of breath and fever. EXAM: PORTABLE CHEST 1 VIEW COMPARISON:  05/10/2018 and prior chest radiographs FINDINGS: Cardiomediastinal silhouette is unchanged. Bilateral airspace opacities, greatest in the UPPER lungs, have not significantly  changed. No pleural effusion or pneumothorax. No acute bony abnormalities identified. IMPRESSION: Unchanged bilateral airspace opacities, UPPER lobe predominance. Electronically Signed   By: JMargarette CanadaM.D.   On: 05/23/2018 15:55   Dg Chest Port 1 View  Result Date: 05/10/2018 CLINICAL DATA:  Shortness of breath. EXAM: PORTABLE CHEST 1 VIEW COMPARISON:  Chest CT 05/02/2018 and chest radiograph 05/02/2018. FINDINGS: Cardiomediastinal silhouette is normal. Mediastinal contours appear intact. Low lung volumes. Worsening aeration of the lungs with increasing bilateral upper  lobe predominant airspace consolidation, and cavitary spaces in the upper lobes. Osseous structures are without acute abnormality. Soft tissues are grossly normal. IMPRESSION: Worsening aeration of the lungs with increasing bilateral upper lobe predominant airspace consolidation, and cavitary spaces in the upper lobes. Electronically Signed   By: Fidela Salisbury M.D.   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 He presents for bone marrow biopsy and culture. EXAM: CT GUIDED BONE MARROW ASPIRATION AND CORE BIOPSY Interventional Radiologist:  Criselda Peaches, MD MEDICATIONS: None. ANESTHESIA/SEDATION: Moderate (conscious) sedation was employed during this procedure. A total of 1.5 milligrams versed and 50 micrograms fentanyl were administered intravenously. The patient's level of consciousness and vital signs were monitored continuously by radiology nursing throughout the procedure under my direct supervision. Total monitored sedation time: 10 minutes FLUOROSCOPY TIME:  Fluoroscopy Time: 0 minutes 0 seconds (0 mGy). COMPLICATIONS: None immediate. Estimated blood loss: <25 mL PROCEDURE: Informed written consent was obtained from the patient after a thorough discussion of the procedural risks, benefits and alternatives. All  questions were addressed. Maximal Sterile Barrier Technique was utilized including caps, mask, sterile gowns, sterile gloves, sterile drape, hand hygiene and skin antiseptic. A timeout was performed prior to the initiation of the procedure. The patient was positioned prone and non-contrast localization CT was performed of the pelvis to demonstrate the iliac marrow spaces. Maximal barrier sterile technique utilized including caps, mask, sterile gowns, sterile gloves, large sterile drape, hand hygiene, and betadine prep. Under sterile conditions and local anesthesia, an 11 gauge coaxial bone biopsy needle was advanced into the right iliac marrow space. Needle position was confirmed with CT imaging. Initially, bone marrow aspiration was performed. Next, the 11 gauge outer cannula was utilized to obtain a right iliac bone marrow core biopsy. Needle was removed. Hemostasis was obtained with compression. The patient tolerated the procedure well. Samples were prepared with the cytotechnologist. IMPRESSION: Technically successful CT-guided bone marrow biopsy, aspiration and bone culture. Signed, Criselda Peaches, MD [facility] Radiology Electronically Signed   By: Jacqulynn Cadet M.D.   On: 05/10/2018 09:53   [place] Ekg Site Rite  Result Date: 05/29/2018 If Site Rite image not attached, placement could not be confirmed due to current cardiac rhythm.  US Abdomen Limited Ruq  Result Date: 05/09/2018 CLINICAL DATA:  Abnormal LFTs EXAM: ULTRASOUND ABDOMEN LIMITED RIGHT UPPER QUADRANT COMPARISON:  CT 05/05/2018 FINDINGS: Gallbladder: Marked gallbladder wall thickening measuring up to 9 mm. No stones or sonographic Murphy's sign. Common bile duct: Diameter: Normal caliber, 4 mm Liver: No focal lesion identified. Within normal limits in parenchymal echogenicity. Portal vein is patent on color Doppler imaging with normal direction of blood flow towards the liver. Also noted  is a right pleural effusion and ascites. IMPRESSION: Markedly thickened gallbladder wall. No gallstones visualized. This could be related to liver disease or cholecystitis. Right pleural effusion, mild perihepatic ascites. Electronically Signed   By: Rolm Baptise M.D.   On: 05/09/2018 08:31    Lab Data:  CBC: Recent Labs  Lab 05/23/18 1435  05/24/18 0346 05/25/18 0639 05/26/18 0740 05/28/18 0705 05/30/18 0530  WBC 19.7*   < > 11.5* 11.8* 11.5* 9.3 7.3  NEUTROABS 17.4*  --  10.8*  --   --   --   --   HGB 10.6*   < > 11.9* 11.5* 10.2* 10.7* 10.4*  HCT 32.0*   < >  35.7* 35.0* 31.2* 31.8* 31.7*  MCV 77.5*   < > 77.4* 78.8 78.0 76.8* 76.9*  PLT 130*   < > 139* 138* 136* 163 244   < > = values in this interval not displayed.   Basic Metabolic Panel: Recent Labs  Lab 05/23/18 1452  05/23/18 2321  05/25/18 0639 05/26/18 0740 05/27/18 1017 05/28/18 0705 05/30/18 0530  NA  --    < >  --    < > 140 137 136 137 137  K  --    < >  --    < > 3.6 3.5 3.8 3.3* 3.3*  CL  --    < >  --    < > 116* 115* 110 112* 105  CO2  --    < >  --    < > 19* 17* 17* 18* 22  GLUCOSE  --    < >  --    < > 68 97 119* 136* 105*  BUN  --    < >  --    < > 7 7 6  5* <5*  CREATININE  --    < > 0.87   < > 0.82 0.83 0.82 0.76 0.80  CALCIUM  --    < >  --    < > 7.1* 6.9* 7.0* 6.8* 7.2*  MG 1.4*  --  1.4*  --   --   --   --   --  1.5*  PHOS  --   --   --   --   --   --   --   --  1.8*   < > = values in this interval not displayed.   GFR: Estimated Creatinine Clearance: 82.2 mL/min (by C-G formula based on SCr of 0.8 mg/dL). Liver Function Tests: Recent Labs  Lab 05/24/18 0346 05/26/18 0740 05/27/18 1017 05/28/18 0705 05/30/18 0530  AST 115* 80* 82* 74* 80*  ALT 40 30 30 26 28   ALKPHOS 515* 403* 487* 445* 572*  BILITOT 1.2 1.0 1.3* 1.3* 1.4*  PROT 4.6* 4.2* 4.8* 4.5* 4.7*  ALBUMIN 1.6* 1.3* 1.4* 1.3* 1.4*   Recent Labs  Lab 05/23/18 1452  LIPASE 23   No results for input(s): AMMONIA in the  last 168 hours. Coagulation Profile: Recent Labs  Lab 05/23/18 1435  INR 1.73   Cardiac Enzymes: Recent Labs  Lab 05/24/18 0852 05/24/18 1146 05/24/18 1528  TROPONINI 0.05* 0.03* 0.18*   BNP (last 3 results) No results for input(s): PROBNP in the last 8760 hours. HbA1C: No results for input(s): HGBA1C in the last 72 hours. CBG: Recent Labs  Lab 05/29/18 1551 05/29/18 2029 05/30/18 0027 05/30/18 0425 05/30/18 0810  GLUCAP 109* 92 107* 124* 93   Lipid Profile: No results for input(s): CHOL, HDL, LDLCALC, TRIG, CHOLHDL, LDLDIRECT in the last 72 hours. Thyroid Function Tests: No results for input(s): TSH, T4TOTAL, FREET4, T3FREE, THYROIDAB in the last 72 hours. Anemia Panel: No results for input(s): VITAMINB12, FOLATE, FERRITIN, TIBC, IRON, RETICCTPCT in the last 72 hours. Urine analysis:    Component Value Date/Time   COLORURINE YELLOW 05/24/2018 [place] 05/24/2018 0137   LABSPEC 1.035 (H) 05/24/2018 0137   PHURINE 5.0 05/24/2018 [place] 05/24/2018 0137   HGBUR NEGATIVE 05/24/2018 [place] 05/24/2018 [place] 05/24/2018 0137   PROTEINUR NEGATIVE 05/24/2018 0137   UROBILINOGEN 0.2 02/04/2010 1718   NITRITE NEGATIVE 05/24/2018 0137  LEUKOCYTESUR NEGATIVE 05/24/2018 0137     Fadi Menter M.D. [facility] 05/30/2018, 11:20 AM  Pager: [phone] Between 7am to 7pm - call Pager - [phone]  After 7pm go to [email or web address] - password [email or web address]  Call night coverage person covering after 7pm

## 2018-05-30 NOTE — Progress Notes (Signed)
PHARMACY - ADULT TOTAL PARENTERAL NUTRITION CONSULT NOTE   Pharmacy Consult for TPN Indication: Intolerance to enteral feeding  Patient Measurements: Height: _0  (188 cm) Weight: 141 lb 1.5 oz (64 kg) IBW/kg (Calculated) : 82.2 TPN AdjBW (KG): 68.5 Body mass index is 18.12 kg/m. Usual Weight: 68.6kg   Assessment: 21 YOM presenting with N/V/D and weakness after recently starting RIPE for tuberculosis tx.  CT abdomen negative for acute abdominal pathology or obstruction, C. Diff panel negative.  Difficulty swallowing d/t pain, poor appetite with minimal eating/supplements, reflux improved with gi cocktail, continues to have vomiting and diarrhea.  Hx of sarcoidosis, and severe malnutrition related to chronic illness per RD eval (wt 84kg in Feb 2019).  Pharmacy consulted to start TPN.  GI: Rectal tube 145m output, vomiting x1 unmeasured vol,  PRN zofran/phenergan/GI cocktail, cholestyramine BID, lomotil QID - MV Endo: Recent DM dx (A1c 11.8), CBGs 80-130s Insulin requirements in the past 24 hours: n/a Lytes: K 3.3 (432m ordered), CorCa 9.3, phos 1.8, Mg 1.5 (MgOx 40071maily) Renal: SCr wnl, 0.5 ml/kg/hr (-433m29mulm: RA Cards: Tachy, SBP 120-130s Hepatobil: AST elevated 80, Tbili 1.4, alk phos 572 Neuro: Confused, PRN dilaudid, s/p haldol 6/24 ID: WBC wnl, Afeb, on RIPE  TPN Access:PICC (removed) TPN start date: 6/25 Nutritional Goals (per RD recommendation on 6/24): KCal: 2200-2400 Protein: 100-120 Fluid: >/= 2.2L   Current Nutrition:  Boost Breeze TID (750 kcal and 27g protein daily) taking DSY 3 diet (0-10% meals eaten)  Plan:  Discussed with MD to not initiate TPN, will try tube feeding Give Phos-Nak 3 packets today Repeat labs and supplementation per MD  JonaBertis RuddyarmD Pharmacy Resident [phone]/[phone] 3:37 PM

## 2018-05-30 NOTE — Care Management Note (Signed)
Case Management Note  Patient Details  Name: Jeffrey Hayes MRN: [id number] Date of Birth: [birth year]  Subjective/Objective:                 CM following patient admitted with disseminated TB, encephalopathy, unclear cause, FTT. Was recently DC'd, unable to establish [facility] services due to active TB dx. Was dc'd to home and followed by [facility] for TB medications. Patient with poor nutritional status was planned for TPN, however patient pulled PICC line last night in state of confusion. [name] following for potential pharmacological support for home TPN, has not been accepted into any HH program at this time.) Definitive plan for nutrition support not defined at this time. Palliative consulted for [place] discussion with family. Will continue to follow.    Action/Plan:   Expected Discharge Date:                  Expected Discharge Plan:     In-House Referral:     Discharge planning Services  CM Consult  Post Acute Care Choice:    Choice offered to:     DME Arranged:    DME Agency:     HH Arranged:    HH Agency:     Status of Service:  In process, will continue to follow  If discussed at Long Length of Stay Meetings, dates discussed:    Additional Comments:  Carles Collet, RN 05/30/2018, 1:45 PM

## 2018-05-31 ENCOUNTER — Inpatient Hospital Stay (HOSPITAL_COMMUNITY): Payer: Medicare Other

## 2018-05-31 DIAGNOSIS — R1310 Dysphagia, unspecified: Secondary | ICD-10-CM

## 2018-05-31 LAB — PHOSPHORUS
Phosphorus: 2.9 mg/dL (ref 2.5–4.6)
Phosphorus: 2.9 mg/dL (ref 2.5–4.6)

## 2018-05-31 LAB — GLUCOSE, CAPILLARY
GLUCOSE-CAPILLARY: 165 mg/dL — AB (ref 70–99)
GLUCOSE-CAPILLARY: 104 mg/dL — AB (ref 70–99)
Glucose-Capillary: 101 mg/dL — ABNORMAL HIGH (ref 70–99)
Glucose-Capillary: 101 mg/dL — ABNORMAL HIGH (ref 70–99)
Glucose-Capillary: 117 mg/dL — ABNORMAL HIGH (ref 70–99)
Glucose-Capillary: 91 mg/dL (ref 70–99)

## 2018-05-31 LAB — MAGNESIUM
MAGNESIUM: 1.5 mg/dL — AB (ref 1.7–2.4)
Magnesium: 1.5 mg/dL — ABNORMAL LOW (ref 1.7–2.4)

## 2018-05-31 MED ORDER — VITAL HIGH PROTEIN PO LIQD: 1000.0000 mL | ORAL | Status: DC

## 2018-05-31 MED ORDER — VITAL 1.5 CAL PO LIQD
1000.0000 mL | ORAL | Status: DC
  Administered 2018-05-31 – 2018-06-02 (×2): 1000 mL
  Filled 2018-05-31 (×6): qty 1000

## 2018-05-31 MED ORDER — HYDROCODONE-ACETAMINOPHEN 5-325 MG PO TABS
1.0000 | ORAL_TABLET | Freq: Once | ORAL | Status: AC
  Administered 2018-05-31: 1 via ORAL
  Filled 2018-05-31: qty 1

## 2018-05-31 MED ORDER — SODIUM CHLORIDE 0.9 % IV SOLN
600.0000 mg | Freq: Every day | INTRAVENOUS | Status: DC
  Administered 2018-06-01 – 2018-06-05 (×5): 600 mg via INTRAVENOUS
  Filled 2018-05-31 (×5): qty 600

## 2018-05-31 MED ORDER — DIPHENOXYLATE-ATROPINE 2.5-0.025 MG PO TABS
1.0000 | ORAL_TABLET | Freq: Four times a day (QID) | ORAL | Status: DC | PRN
Start: 2018-05-31 — End: 2018-06-05

## 2018-05-31 MED ORDER — FLUCONAZOLE IN SODIUM CHLORIDE 400-0.9 MG/200ML-% IV SOLN
400.0000 mg | INTRAVENOUS | Status: DC
  Administered 2018-05-31 – 2018-06-05 (×6): 400 mg via INTRAVENOUS
  Filled 2018-05-31 (×6): qty 200

## 2018-05-31 NOTE — Progress Notes (Signed)
Cortrak has been placed and tube feeding started. Will continue to monitor.  Farley Ly RN

## 2018-05-31 NOTE — Progress Notes (Signed)
Nutrition Follow-up  DOCUMENTATION CODES:   Severe malnutrition in context of chronic illness  INTERVENTION:   Tube Feeding:  Vital 1.5 @ 65 ml/hr Provides 106 g of protein, 2340 kcals, 1186 mL of free water. Meets 100% estimated calorie and protein needs  Recommend starting Tube Feeding at rate of 20 ml/hr with plans to titrate by 10 mL q 8 hours until goal rate as pt at risk for refeeding.   Supplement phosphorus, magnesium and potassium. Monitor magnesium, potassium, and phosphorus daily for at least 3 days, MD to replete as needed, as pt is at risk for refeeding syndrome given malnutrition with prolonged poor po intake.  NUTRITION DIAGNOSIS:   Severe Malnutrition related to chronic illness(sarcoidosis, now with caviatary penumonia with active TB) as evidenced by percent weight loss, severe fat depletion, severe muscle depletion.  Being addressed via nutrition support  GOAL:   Patient will meet greater than or equal to 90% of their needs  Progressed  MONITOR:   PO intake, Supplement acceptance, Labs, Weight trends  REASON FOR ASSESSMENT:   Malnutrition Screening Tool    ASSESSMENT:   67 yo male admitted with chest pain, fever and dairrhea with sepsis due to bilateral cavitary pneumonia with active TB, hx sarcoidosis. Pt with additional hx of HTN, DM  Noted PICC line placed 6/24 but pulled out by patient. TPN never initiated Palliative care has been consulted for goals of care  Poor po intake continues Continues with difficulty swallowing. Noted pt being treated with IV fluconazole for presumptive thrush. Also noted plan for barium swallow to rule out stricture  Plan for Cortrak tube placement today with initiation of TF. Noted MD wanting barium swallow to be done prior to Cortrak tube placement.  If unable to have Cortrak tube placed today, recommend IR placing small bore feeding tube.   Labs: potassium 3.3, phosphorus 1.8, magnesium 1.5, ammonia 42 Meds:  cholesytyramine, fluconazole, GI cocktail, imodium, mag ox, MVI, KCl, vitamin B-6, potassium phosphate   Diet Order:   Diet Order           DIET DYS 3 Room service appropriate? Yes; Fluid consistency: Thin  Diet effective now          EDUCATION NEEDS:   Education needs have been addressed  Skin:  Skin Integrity Issues:: Stage II, Unstageable Stage II: sacrum Unstageable: penis Incisions: n/a  Last BM:  6/25  Height:   Ht Readings from Last 1 Encounters:  05/24/18 6\' 2"  (1.88 m)    Weight:   Wt Readings from Last 1 Encounters:  05/30/18 143 lb 4.8 oz (65 kg)    Ideal Body Weight:     BMI:  Body mass index is 18.4 kg/m.  Estimated Nutritional Needs:   Kcal:  2200-2400 kcals   Protein:  100-120 g  Fluid:  >/= 2.2 L   Kerman Passey MS, RD, LDN, CNSC [phone] Pager  [phone] Weekend/On-Call Pager

## 2018-05-31 NOTE — Progress Notes (Deleted)
Cortrak Tube Team Note:  Consult received to place a Cortrak feeding tube.   A 10 F Cortrak tube was placed in the left nare and secured with a nasal bridle at 60 cm. Per the Cortrak monitor reading the tube tip is gastric.   X-ray is required, abdominal x-ray has been ordered by the Cortrak team. Please confirm tube placement before using the Cortrak tube.   If the tube becomes dislodged please keep the tube and contact the Cortrak team at [email or web address] (password [email or web address]) for replacement.  If after hours and replacement cannot be delayed, place a NG tube and confirm placement with an abdominal x-ray.    Darla Mcdonald MS, RD, LDN Pager #- [phone] Office#- [phone] After Hours Pager: [phone]   

## 2018-05-31 NOTE — Progress Notes (Signed)
Cortrak Tube Team Note:  Consult received to place a Cortrak feeding tube.   A 10 F Cortrak tube was placed in the left nare and secured with a nasal bridle at 60 cm. Per the Cortrak monitor reading the tube tip is gastric.   X-ray is required, abdominal x-ray has been ordered by the Cortrak team. Please confirm tube placement before using the Cortrak tube.   If the tube becomes dislodged please keep the tube and contact the Cortrak team at [email or web address] (password [email or web address]) for replacement.  If after hours and replacement cannot be delayed, place a NG tube and confirm placement with an abdominal x-ray.    Jeffrey Sperbeck MS, RD, LDN Pager #- [phone] Office#- [phone] After Hours Pager: [phone]   

## 2018-05-31 NOTE — Progress Notes (Signed)
    [facility] for Infectious Disease    Date of Admission:  05/23/2018   Total days of antibiotics 28        Day 28 RIFE- plus vit b6           ID: Jeffrey Hayes is a 67 y.o. male with disseminated mTB with hx of sarcoidosis,- now with worsening anorexia, and severe malnutrition. Principal Problem:   Sepsis ([place]) Active Problems:   Diabetes mellitus without complication ([place])   Sarcoidosis   Hypertension   Thrombocytopenia ([facility])   Hyponatremia   Pulmonary tuberculosis   Diarrhea   Pressure injury of skin   Hypoglycemia    Subjective: Afebrile. Still poor po intake. His wife reports that he has been reporting difficulty swallowing where he has required pills to be crushed and in applesauce.  Medications:  . baclofen  5 mg Oral TID  . cholestyramine light  4 g Oral BID AC  . diphenoxylate-atropine  1 tablet Oral QID  . enoxaparin (LOVENOX) injection  40 mg Subcutaneous Q24H  . ethambutol  1,200 mg Oral Daily  . feeding supplement  1 Container Oral TID BM  . isoniazid  300 mg Oral Daily  . magnesium oxide  400 mg Oral Daily  . multivitamin with minerals  1 tablet Oral Daily  . prochlorperazine  5 mg Oral TID AC  . pyrazinamide  1,500 mg Oral Daily  . pyridOXINE  50 mg Oral Daily  . rifampin  600 mg Oral Daily    Objective: Vital signs in last 24 hours: Temp:  [98 F (36.7 C)-99.7 F (37.6 C)] 99 F (37.2 C) (06/26 0409) Pulse Rate:  [104-123] 123 (06/26 0409) Resp:  [18-21] 21 (06/26 0409) BP: (106-130)/(85-91) 106/85 (06/26 0409) SpO2:  [96 %-99 %] 99 % (06/26 0409) Weight:  [143 lb 4.8 oz (65 kg)] 143 lb 4.8 oz (65 kg) (06/25 2041) Physical Exam  Constitutional: He is oriented to person, place He appears chronically ill and cachetic. No distress.  HENT:  Mouth/Throat: Oropharynx is clear and moist. Thrush+ Cardiovascular: Normal rate, regular rhythm and normal heart sounds. Exam reveals no gallop and no friction rub.  No murmur heard.  Pulmonary/Chest:  Effort normal and breath sounds normal. No respiratory distress. He has no wheezes.  Ext: anasarca .  Skin: Skin is warm and dry. No rash noted. No erythema.  Psychiatric: sleep  Lab Results Recent Labs    05/30/18 0530  WBC 7.3  HGB 10.4*  HCT 31.7*  NA 137  K 3.3*  CL 105  CO2 22  BUN <5*  CREATININE 0.80   Liver Panel Recent Labs    05/30/18 0530  PROT 4.7*  ALBUMIN 1.4*  AST 80*  ALT 28  ALKPHOS 572*  BILITOT 1.4*    Microbiology: reviewed Studies/Results: No results found.   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 Will change his rifampin to IV temporarily to help reduced nausea  Dysphagia/thrush = will treat with IV fluconazole to treat presumptively for eso candidiasis. Also get barium swallow in order to rule out stricture as discussed with GI team  Severe protein-caloric malnutrition = patient and wife agreement for dobhoff tube placement for tube feeds. Watch for refeeding syndrome. If tolerates and improved consider PEG down the line   Plan communicated with dr Clementeen Graham  [facility] for Infectious Diseases Cell: [phone] Pager: [phone]  05/31/2018, 10:50 AM

## 2018-05-31 NOTE — Progress Notes (Signed)
Patient doesn't have feeding tube in place. Patient was not able to go down for DG of esophagus d/t having TB. Patient will have to have Cortrak placement tomorrow. MD made aware. Will continue tomorrow.   Farley Ly RN

## 2018-05-31 NOTE — Progress Notes (Signed)
PROGRESS NOTE                                                                                                                                                                                                             Patient Demographics:    Jeffrey Hayes, is a 67 y.o. male, DOB - 1951/04/06, AFB:[id number]  Admit date - 05/23/2018   Admitting Physician Rise Patience, MD  Outpatient Primary MD for the patient is [name], MD  LOS - 8  Outpatient Specialists: none  Chief Complaint  Patient presents with  . Chest Pain  . Fever  . Diarrhea       Brief Narrative   67 y.o.malewithrecently diagnosed pulmonary tuberculosis discharged home on antituberculosis medications presents to the ER with complaints of persistent nausea vomiting and diarrhea over the last few days. Also has been having hiccups. Has been feeling weak. Has not been able to eat anything because of the vomiting. Patient has been compliant with his anti-tuberculosis medications.  In ED, patient was febrile, tachycardia, lactic acidosis, hypokalemia, hypomagnesemia. CT chest abdomen and pelvis showed fused lung nodules and apical predominant lung consolidation, small right pleural effusion, scattered air-fluid levels in the colon and rectum suggesting viral illness, no bowel obstruction.        Subjective:   Patient continues to be confused.  Having difficulty maintaining IV line and requiring mittens.   Assessment  & Plan :    Principal Problem:   Sepsis ([place]) Secondary to active tuberculosis in the setting of sarcoidosis and bilateral cavitary pneumonia. On RI PE antitubercular meds along with vitamin B6.  Switched rifampin to IV for better penetration.  Active Problems:  Nausea vomiting/diarrhea and failure to thrive Possibly due to medication versus intestinal infection with tuberculosis.  CT abdomen without obstruction or acute  abdominal findings.  C. difficile and GI pathogen panel negative.  On scheduled antiemetics and cholestyramine.  PICC line placed for short-term TPN but patient pulled it out.  Planning on barium esophagogram which will have to be done at bedside due to need for negative pressure room.  Unable to place a core track for feeding today due to staff and availability.  Will be placed tomorrow. Concern for refeeding syndrome. Palliative care consulted for goals of care discussion given progressive decline in symptoms.  Will also need to  discuss for PEG tube placement for long-term nutrition.   Acute metabolic encephalopathy Suspect due to active infection, dehydration.  Hold off head CT for now.  Requiring restraints.  Replenish electrolytes.  Awaited.  B12 normal.  B1 level pending. Transaminitis Possibly due to active TB and medications.  Right upper quadrant ultrasound earlier this month showed no gallstones but thickened gallbladder this could be due to liver disease versus cholestasis.  CT abdomen pelvis on admission did not show any gallstone , gallbladder thickening or biliary dilatation. LFTs slowly improving.  Thrombocytopenia Secondary to active TB and sarcoidosis Currently stable.  Bone marrow biopsy done during previous admission showed normocellular marrow with numerous granulomata  Type 2 diabetes mellitus A1c of 11.8.  CBG currently stable on sliding scale.  Amaryl and metformin discontinued during last hospitalization.  Severe protein calorie malnutrition Pulled out PICC line on 6/25.  Will need core track placed in tomorrow and start tube feed.  High risk for refeeding syndrome.  Plan on discussing PEG tube placement during palliative care discussion.      CODE STATUS: Full code  Family Communication  : None at bedside, wife involved in care  Disposition Plan  : Undecided.  Pending issues with nutritional status, encephalopathy and palliative care for goals of care  discussion  Barriers For Discharge : Active symptoms Consults  : ID/palliative care  Procedures  : CT chest and abdomen  DVT Prophylaxis  :  Lovenox -  Lab Results  Component Value Date   PLT 244 05/30/2018    Antibiotics  :    Anti-infectives (From admission, onward)   Start     Dose/Rate Route Frequency Ordered Stop   06/01/18 1000  rifampin (RIFADIN) 600 mg in sodium chloride 0.9 % 100 mL IVPB     600 mg 200 mL/hr over 30 Minutes Intravenous Daily 05/31/18 1053     05/31/18 1100  fluconazole (DIFLUCAN) IVPB 400 mg     400 mg 100 mL/hr over 120 Minutes Intravenous Every 24 hours 05/31/18 1059     05/24/18 1000  ethambutol (MYAMBUTOL) tablet 1,200 mg     1,200 mg Oral Daily 05/23/18 2104     05/24/18 1000  isoniazid (NYDRAZID) tablet 300 mg     300 mg Oral Daily 05/23/18 2104     05/24/18 1000  pyrazinamide tablet 1,500 mg     1,500 mg Oral Daily 05/23/18 2104     05/24/18 1000  rifampin (RIFADIN) capsule 600 mg  Status:  Discontinued     600 mg Oral Daily 05/23/18 2104 05/31/18 1053   05/23/18 2330  piperacillin-tazobactam (ZOSYN) IVPB 3.375 g  Status:  Discontinued     3.375 g 12.5 mL/hr over 240 Minutes Intravenous Every 8 hours 05/23/18 1529 05/25/18 1237   05/23/18 2330  vancomycin (VANCOCIN) IVPB 750 mg/150 ml premix  Status:  Discontinued     750 mg 150 mL/hr over 60 Minutes Intravenous Every 8 hours 05/23/18 1530 05/23/18 2104   05/23/18 1530  piperacillin-tazobactam (ZOSYN) IVPB 3.375 g     3.375 g 100 mL/hr over 30 Minutes Intravenous  Once 05/23/18 1515 05/23/18 1625   05/23/18 1530  vancomycin (VANCOCIN) IVPB 1000 mg/200 mL premix  Status:  Discontinued     1,000 mg 200 mL/hr over 60 Minutes Intravenous  Once 05/23/18 1515 05/23/18 1528   05/23/18 1530  vancomycin (VANCOCIN) 1,500 mg in sodium chloride 0.9 % 500 mL IVPB     1,500 mg 250 mL/hr over 120 Minutes Intravenous  Once 05/23/18 1528 05/23/18 1807        Objective:   Vitals:   05/30/18 0852  05/30/18 1700 05/30/18 2041 05/31/18 0409  BP: 124/81 130/86 (!) 130/91 106/85  Pulse: (!) 116 (!) 112 (!) 104 (!) 123  Resp: _0 (!) 21  Temp: 97.6 F (36.4 C) 98 F (36.7 C) 99.7 F (37.6 C) 99 F (37.2 C)  TempSrc: Oral Oral Oral Oral  SpO2: 94% 96% 97% 99%  Weight:   65 kg (143 lb 4.8 oz)   Height:        Wt Readings from Last 3 Encounters:  05/30/18 65 kg (143 lb 4.8 oz)  05/14/18 68.7 kg (151 lb 7.3 oz)  03/11/18 75.3 kg (166 lb)     Intake/Output Summary (Last 24 hours) at 05/31/2018 1502 Last data filed at 05/31/2018 1254 Gross per 24 hour  Intake 640 ml  Output 400 ml  Net 240 ml     Physical Exam  Gen: Fatigued, has mittens HEENT: Pallor present, dry mucosa with concern for early oral thrush, supple neck Chest: Clear bilaterally CVS: S1-S2 tachycardic, no murmurs GI: Soft, nondistended, nontender Muscular skeletal: Warm, no edema CNs: Alert and oriented x1-2     Data Review:    CBC Recent Labs  Lab 05/25/18 0639 05/26/18 0740 05/28/18 0705 05/30/18 0530  WBC 11.8* 11.5* 9.3 7.3  HGB 11.5* 10.2* 10.7* 10.4*  HCT 35.0* 31.2* 31.8* 31.7*  PLT 138* 136* 163 244  MCV 78.8 78.0 76.8* 76.9*  MCH 25.9* 25.5* 25.8* 25.2*  MCHC 32.9 32.7 33.6 32.8  RDW 17.2* 16.9* 16.9* 17.2*    Chemistries  Recent Labs  Lab 05/25/18 0639 05/26/18 0740 05/27/18 1017 05/28/18 0705 05/30/18 0530  NA 140 137 136 137 137  K 3.6 3.5 3.8 3.3* 3.3*  CL 116* 115* 110 112* 105  CO2 19* 17* 17* 18* 22  GLUCOSE 68 97 119* 136* 105*  BUN _1 5* <5*  CREATININE 0.82 0.83 0.82 0.76 0.80  CALCIUM 7.1* 6.9* 7.0* 6.8* 7.2*  MG  --   --   --   --  1.5*  AST  --  80* 82* 74* 80*  ALT  --  _2 ALKPHOS  --  403* 487* 445* 572*  BILITOT  --  1.0 1.3* 1.3* 1.4*   ------------------------------------------------------------------------------------------------------------------ No results for input(s): CHOL, HDL, LDLCALC, TRIG, CHOLHDL, LDLDIRECT in the last  72 hours.  Lab Results  Component Value Date   HGBA1C 11.8 (H) 05/12/2018   ------------------------------------------------------------------------------------------------------------------ No results for input(s): TSH, T4TOTAL, T3FREE, THYROIDAB in the last 72 hours.  Invalid input(s): FREET3 ------------------------------------------------------------------------------------------------------------------ Recent Labs    05/30/18 1155  VITAMINB12 1,334*    Coagulation profile No results for input(s): INR, PROTIME in the last 168 hours.  No results for input(s): DDIMER in the last 72 hours.  Cardiac Enzymes Recent Labs  Lab 05/24/18 1528  TROPONINI 0.18*   ------------------------------------------------------------------------------------------------------------------    Component Value Date/Time   BNP 95.3 05/24/2018 0852    Inpatient Medications  Scheduled Meds: . baclofen  5 mg Oral TID  . cholestyramine light  4 g Oral BID AC  . enoxaparin (LOVENOX) injection  40 mg Subcutaneous Q24H  . ethambutol  1,200 mg Oral Daily  . feeding supplement  1 Container Oral TID BM  . isoniazid  300 mg Oral Daily  . magnesium oxide  400 mg Oral Daily  . multivitamin with minerals  1 tablet  Oral Daily  . prochlorperazine  5 mg Oral TID AC  . pyrazinamide  1,500 mg Oral Daily  . pyridOXINE  50 mg Oral Daily   Continuous Infusions: . sodium chloride 10 mL/hr at 05/25/18 1311  . sodium chloride 10 mL/hr at 05/31/18 1247  . feeding supplement (VITAL 1.5 CAL)    . fluconazole (DIFLUCAN) IV 400 mg (05/31/18 1248)  . [START ON 06/01/2018] rifampin (RIFADIN) IVPB     PRN Meds:.acetaminophen **OR** acetaminophen, diphenoxylate-atropine, gi cocktail, HYDROmorphone (DILAUDID) injection, loperamide, promethazine, sodium chloride flush  Micro Results Recent Results (from the past 240 hour(s))  Culture, blood (Routine x 2)     Status: None   Collection Time: 05/23/18  2:53 PM  Result  Value Ref Range Status   Specimen Description BLOOD RIGHT ANTECUBITAL  Final   Special Requests   Final    BOTTLES DRAWN AEROBIC AND ANAEROBIC Blood Culture adequate volume   Culture   Final    NO GROWTH 5 DAYS Performed at [facility], [place] [address] [place], [place] [address]    Report Status 05/28/2018 FINAL  Final  Culture, blood (Routine x 2)     Status: None   Collection Time: 05/23/18  3:43 PM  Result Value Ref Range Status   Specimen Description BLOOD BLOOD LEFT FOREARM  Final   Special Requests   Final    BOTTLES DRAWN AEROBIC ONLY Blood Culture results may not be optimal due to an inadequate volume of blood received in culture bottles   Culture   Final    NO GROWTH 5 DAYS Performed at [facility], [place] [address] [place], [place] [address]    Report Status 05/28/2018 FINAL  Final  Gastrointestinal Panel by PCR , Stool     Status: None   Collection Time: 05/23/18 10:45 PM  Result Value Ref Range Status   Campylobacter species NOT DETECTED NOT DETECTED Final   Plesimonas shigelloides NOT DETECTED NOT DETECTED Final   Salmonella species NOT DETECTED NOT DETECTED Final   Yersinia enterocolitica NOT DETECTED NOT DETECTED Final   Vibrio species NOT DETECTED NOT DETECTED Final   Vibrio cholerae NOT DETECTED NOT DETECTED Final   Enteroaggregative E coli (EAEC) NOT DETECTED NOT DETECTED Final   Enteropathogenic E coli (EPEC) NOT DETECTED NOT DETECTED Final   Enterotoxigenic E coli (ETEC) NOT DETECTED NOT DETECTED Final   Shiga like toxin producing E coli (STEC) NOT DETECTED NOT DETECTED Final   Shigella/Enteroinvasive E coli (EIEC) NOT DETECTED NOT DETECTED Final   Cryptosporidium NOT DETECTED NOT DETECTED Final   Cyclospora cayetanensis NOT DETECTED NOT DETECTED Final   Entamoeba histolytica NOT DETECTED NOT DETECTED Final   Giardia lamblia NOT DETECTED NOT DETECTED Final   Adenovirus F40/41 NOT DETECTED NOT DETECTED Final   Astrovirus NOT DETECTED NOT DETECTED  Final   Norovirus GI/GII NOT DETECTED NOT DETECTED Final   Rotavirus A NOT DETECTED NOT DETECTED Final   Sapovirus (I, II, IV, and V) NOT DETECTED NOT DETECTED Final    Comment: Performed at [facility], [place] [place], [place] [address]  C difficile quick scan w PCR reflex     Status: None   Collection Time: 05/23/18 11:00 PM  Result Value Ref Range Status   C Diff antigen NEGATIVE NEGATIVE Final   C Diff toxin NEGATIVE NEGATIVE Final   C Diff interpretation No C. difficile detected.  Final    Comment: Performed at [facility], [place]  [address] [place], [place] [address]  Urine culture     Status: None   Collection Time: 05/24/18  1:37 AM  Result Value Ref Range Status   Specimen Description URINE, CLEAN CATCH  Final   Special Requests NONE  Final   Culture   Final    NO GROWTH Performed at [facility], [place] [address], [place], [place] [address]    Report Status 05/25/2018 FINAL  Final  Culture, blood (routine x 2)     Status: None (Preliminary result)   Collection Time: 05/27/18  2:25 PM  Result Value Ref Range Status   Specimen Description BLOOD LEFT ANTECUBITAL  Final   Special Requests   Final    BOTTLES DRAWN AEROBIC AND ANAEROBIC Blood Culture adequate volume   Culture   Final    NO GROWTH 4 DAYS Performed at [facility], [place] [address] [place], [place] [address]    Report Status PENDING  Incomplete  Culture, blood (routine x 2)     Status: None (Preliminary result)   Collection Time: 05/27/18  2:39 PM  Result Value Ref Range Status   Specimen Description BLOOD LEFT HAND  Final   Special Requests   Final    BOTTLES DRAWN AEROBIC AND ANAEROBIC Blood Culture adequate volume   Culture   Final    NO GROWTH 4 DAYS Performed at [facility], [place] [address], [place],  [address]    Report Status PENDING  Incomplete    Radiology Reports Ct Abdomen Pelvis Wo Contrast  Result Date: 05/23/2018 CLINICAL DATA:   Tuberculosis.  Worsening cough and fever. EXAM: CT CHEST, ABDOMEN AND PELVIS WITHOUT CONTRAST TECHNIQUE: Multidetector CT imaging of the chest, abdomen and pelvis was performed following the standard protocol without IV contrast. COMPARISON:  Chest radiograph 05/23/2018. Chest CTA 05/02/2018. CT abdomen and pelvis 05/05/2018. CONTRAST EXTRAVASATION CONSULTATION: Type of contrast:  Isovue 300 Site of extravasation: Left antecubital fossa Estimated volume of extravasation: 100 ml Area of extravasation scanned with CT? no PATIENT'S SIGNS AND SYMPTOMS Skin blistering/ulceration: no Decrease capillary refill: no Change in skin color: no Decreased motor function or severe tightness: no Decreased pulses distal to site of extravasation: no Altered sensation: no Increasing pain or signs of increased swelling during observation: no TREATMENT Observation period at site: Patient returned to the emergency department for continued observation. Orders placed for limb elevation and application of ice pack. Plastic surgery consulted? no DOCUMENTATION AND FOLLOW-UP Post extravasation orders completed? yes Patient's questions answered? yes Patient instructed to call [phone] or seek immediate medical care for new/progressive symptoms. FINDINGS: CT CHEST FINDINGS Cardiovascular: Thoracic aortic atherosclerosis without aneurysm. Normal heart size. Scattered coronary artery calcification. No significant pericardial effusion. Mediastinum/Nodes: AP window lymph nodes measure up to 12 mm in short axis, similar to the prior CT. Similar 1.4 cm short axis right hilar lymph node. Mild motion and streak artifact through the subcarinal region limits evaluation of the previously described lymph node in this location. Unremarkable thyroid. Possible mild esophageal wall thickening. Lungs/Pleura: Small right pleural effusion, new from the prior CT. No pneumothorax. Innumerable nodules are again seen diffusely throughout both lungs, greatest in  the upper lungs and less in the bases. Bilateral upper lobe airspace consolidation is greatest in the apices, overall stable to slightly progressed since the prior CT. Small areas of cavitation within the biapical consolidation have not significantly progressed. A small amount of patchy subpleural consolidation anteriorly in the right middle lobe is new from  the prior CT, and patchy right middle lobe consolidation laterally has increased. There is mild dependent atelectasis in the right lower lobe. Musculoskeletal: Postsurgical changes at the left shoulder. Mild thoracic disc degeneration. No suspicious osseous lesion. CT ABDOMEN PELVIS FINDINGS The study is mildly motion degraded. Hepatobiliary: No focal liver abnormality is seen. No gallstones, gallbladder wall thickening, or biliary dilatation. Pancreas: Unremarkable. Spleen: Unremarkable. Adrenals/Urinary Tract: Unchanged right adrenal nodule. Unremarkable left adrenal gland. Unchanged 11 mm left lower pole renal hypodensity, likely a cyst. Areas of subtle hypoenhancement in both kidneys on the prior CT cannot be re-evaluated today due to lack of IV contrast. There is no hydronephrosis. Unremarkable bladder. Stomach/Bowel: The stomach is largely collapsed. Scattered fluid is present throughout the colon, and there is also a moderate-sized air-fluid level in the rectum. Fluid is also noted in nondilated small bowel loops. There is no evidence of bowel obstruction. The appendix is grossly unremarkable. Vascular/Lymphatic: Mild abdominal aortic atherosclerosis without aneurysm. No enlarged lymph nodes. Reproductive: Unremarkable prostate. Other: Trace ascites, decreased from prior. No loculated fluid collection. Persistent diffusely edematous appearance of the intra-abdominal fat. Unchanged 1.5 cm calcified mesenteric nodule in the left mid abdomen (series 5, image 76). Musculoskeletal: No suspicious osseous lesion. Lower lumbar disc degeneration including severe  disc space narrowing and spurring at L4-5 and L5-S1 resulting in moderate to severe bilateral neural foraminal stenosis. IMPRESSION: 1. Diffuse lung nodules and apical predominant lung consolidation, overall stable to mildly progressed from 05/02/2018 and consistent with the patient's diagnosis of tuberculosis. 2. Small right pleural effusion, new from 05/02/2018. 3. Unchanged mild mediastinal and right hilar lymphadenopathy. 4. Scattered air-fluid levels in the colon and rectum suggesting a diarrheal illness. No bowel obstruction. 5. Trace ascites, decreased from prior. 6. Unchanged calcified mesenteric nodule. 7.  Aortic Atherosclerosis (ICD10-I70.0). 8. Contrast extravasation as documented above. Electronically Signed   By: Logan Bores M.D.   On: 05/23/2018 18:12   Dg Chest 1 View  Result Date: 05/27/2018 CLINICAL DATA:  Encounter for fever and chills. EXAM: CHEST  1 VIEW COMPARISON:  Chest CT, 05/23/2018. Chest radiographs, most recent 05/23/2018. FINDINGS: Multiple bilateral small lung nodules with hazy airspace type lung opacity, most evident in the upper lobes, is without significant change. No new lung abnormalities. Heart is normal in size.  No mediastinal or hilar masses. No convincing pleural effusion.  No pneumothorax. IMPRESSION: 1. No significant change in the previously described bilateral lung opacities and numerous bilateral lung nodules. 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 Electronically Signed   By: Lajean Manes M.D.   On: 05/27/2018 13:25   Dg Chest 2 View  Result Date: 05/02/2018 CLINICAL DATA:  67 year old male with cirrhosis. Weakness and loss of appetite. EXAM: CHEST - 2 VIEW COMPARISON:  Chest CT 08/25/2017, radiographs 06/25/2016. FINDINGS: Semi upright AP and lateral views of the chest. Diffuse reticulonodular pulmonary opacity with an upper lobe predominance, and some semi confluent areas of upper lobe involvement. No superimposed pneumothorax or pleural effusion.  Mediastinal contours remain normal. Visualized tracheal air column is within normal limits. Chronic postoperative changes to the left scapula. No acute osseous abnormality identified. Negative visible bowel gas pattern. IMPRESSION: Diffuse pulmonary reticulonodular opacity with some mildly confluent areas in the bilateral upper lobes. No pleural effusion. The appearance is nonspecific. Favor acute viral/atypical respiratory infection. Characterization with Chest CT (noncontrast probably would suffice) may be valuable. Electronically Signed   By: Genevie Ann M.D.   On: 05/02/2018 16:30   Ct Chest Wo Contrast  Result Date: 05/23/2018 CLINICAL DATA:  Tuberculosis.  Worsening cough and fever. EXAM: CT CHEST, ABDOMEN AND PELVIS WITHOUT CONTRAST TECHNIQUE: Multidetector CT imaging of the chest, abdomen and pelvis was performed following the standard protocol without IV contrast. COMPARISON:  Chest radiograph 05/23/2018. Chest CTA 05/02/2018. CT abdomen and pelvis 05/05/2018. CONTRAST EXTRAVASATION CONSULTATION: Type of contrast:  Isovue 300 Site of extravasation: Left antecubital fossa Estimated volume of extravasation: 100 ml Area of extravasation scanned with CT? no PATIENT'S SIGNS AND SYMPTOMS Skin blistering/ulceration: no Decrease capillary refill: no Change in skin color: no Decreased motor function or severe tightness: no Decreased pulses distal to site of extravasation: no Altered sensation: no Increasing pain or signs of increased swelling during observation: no TREATMENT Observation period at site: Patient returned to the emergency department for continued observation. Orders placed for limb elevation and application of ice pack. Plastic surgery consulted? no DOCUMENTATION AND FOLLOW-UP Post extravasation orders completed? yes Patient's questions answered? yes Patient instructed to call [phone] or seek immediate medical care for new/progressive symptoms. FINDINGS: CT CHEST FINDINGS Cardiovascular: Thoracic  aortic atherosclerosis without aneurysm. Normal heart size. Scattered coronary artery calcification. No significant pericardial effusion. Mediastinum/Nodes: AP window lymph nodes measure up to 12 mm in short axis, similar to the prior CT. Similar 1.4 cm short axis right hilar lymph node. Mild motion and streak artifact through the subcarinal region limits evaluation of the previously described lymph node in this location. Unremarkable thyroid. Possible mild esophageal wall thickening. Lungs/Pleura: Small right pleural effusion, new from the prior CT. No pneumothorax. Innumerable nodules are again seen diffusely throughout both lungs, greatest in the upper lungs and less in the bases. Bilateral upper lobe airspace consolidation is greatest in the apices, overall stable to slightly progressed since the prior CT. Small areas of cavitation within the biapical consolidation have not significantly progressed. A small amount of patchy subpleural consolidation anteriorly in the right middle lobe is new from the prior CT, and patchy right middle lobe consolidation laterally has increased. There is mild dependent atelectasis in the right lower lobe. Musculoskeletal: Postsurgical changes at the left shoulder. Mild thoracic disc degeneration. No suspicious osseous lesion. CT ABDOMEN PELVIS FINDINGS The study is mildly motion degraded. Hepatobiliary: No focal liver abnormality is seen. No gallstones, gallbladder wall thickening, or biliary dilatation. Pancreas: Unremarkable. Spleen: Unremarkable. Adrenals/Urinary Tract: Unchanged right adrenal nodule. Unremarkable left adrenal gland. Unchanged 11 mm left lower pole renal hypodensity, likely a cyst. Areas of subtle hypoenhancement in both kidneys on the prior CT cannot be re-evaluated today due to lack of IV contrast. There is no hydronephrosis. Unremarkable bladder. Stomach/Bowel: The stomach is largely collapsed. Scattered fluid is present throughout the colon, and there is  also a moderate-sized air-fluid level in the rectum. Fluid is also noted in nondilated small bowel loops. There is no evidence of bowel obstruction. The appendix is grossly unremarkable. Vascular/Lymphatic: Mild abdominal aortic atherosclerosis without aneurysm. No enlarged lymph nodes. Reproductive: Unremarkable prostate. Other: Trace ascites, decreased from prior. No loculated fluid collection. Persistent diffusely edematous appearance of the intra-abdominal fat. Unchanged 1.5 cm calcified mesenteric nodule in the left mid abdomen (series 5, image 76). Musculoskeletal: No suspicious osseous lesion. Lower lumbar disc degeneration including severe disc space narrowing and spurring at L4-5 and L5-S1 resulting in moderate to severe bilateral neural foraminal stenosis. IMPRESSION: 1. Diffuse lung nodules and apical predominant lung consolidation, overall stable to mildly progressed from 05/02/2018 and consistent with the patient's diagnosis of tuberculosis. 2. Small right pleural effusion, new from 05/02/2018.  3. Unchanged mild mediastinal and right hilar lymphadenopathy. 4. Scattered air-fluid levels in the colon and rectum suggesting a diarrheal illness. No bowel obstruction. 5. Trace ascites, decreased from prior. 6. Unchanged calcified mesenteric nodule. 7.  Aortic Atherosclerosis (ICD10-I70.0). 8. Contrast extravasation as documented above. Electronically Signed   By: Logan Bores M.D.   On: 05/23/2018 18:12   Ct Angio Chest Pe W And/or Wo Contrast  Result Date: 05/02/2018 CLINICAL DATA:  Three weeks of decreased appetite and generalized weakness. Shortness of breath with exertion. No fever. EXAM: CT ANGIOGRAPHY CHEST WITH CONTRAST TECHNIQUE: Multidetector CT imaging of the chest was performed using the standard protocol during bolus administration of intravenous contrast. Multiplanar CT image reconstructions and MIPs were obtained to evaluate the vascular anatomy. CONTRAST:  139m ISOVUE-370 IOPAMIDOL  (ISOVUE-370) INJECTION 76% COMPARISON:  Chest x-ray May 02, 2018.  Chest CT September 02, 2017 FINDINGS: Cardiovascular: The thoracic aorta demonstrates atherosclerotic change with no aneurysm or dissection. Stairstep artifact is seen, particularly in the lung bases, limiting evaluation for pulmonary emboli. Taking the artifact into account, there is no convincing evidence of pulmonary emboli. Mediastinum/Nodes: The thyroid is normal. There is a prominent subcarinal node which measures 18 mm and is stable. Shotty nodes in the AP window are stable. There is a prominent right hilar node which was not well assessed on the previous unenhanced study. Lungs/Pleura: Central airways are normal. No pneumothorax. Bilateral pulmonary infiltrates consisting of both numerous small nodules and more confluent regions. The infiltrates are most prominent in the upper lobes with cavitary rounded regions as seen on coronal image 74 measuring up to 3.6 cm in the right upper lobe, coronal image 67 in the left apex measuring up to 1.9 cm, and coronal image 71 in the left upper lobe measuring up to 1.9 cm. The right middle lobe and lower lobes are involve as well but less severely. No other acute abnormalities identified within the lung parenchyma. Upper Abdomen: Thickening of the right adrenal gland is stable, likely hyperplasia. Increased attenuation in the fat of the omentum within the left upper quadrant is new and nonspecific. Probable minimal ascites. Musculoskeletal: No chest wall abnormality. No acute or significant osseous findings. Review of the MIP images confirms the above findings. IMPRESSION: 1. Bilateral pulmonary infiltrates most prominent in the upper lobes with rounded regions of cavitation in the bilateral apices. Findings are most consistent with an infectious or inflammatory process. Atypical infections such as tuberculosis should be considered. Non tuberculous mycobacterial infections and fungal infections are also  possible. Malignancy is considered less likely. Inflammatory causes such as sarcoidosis could also present with upper lobe cavitating nodules. There are a few scattered prominent nodes in the mediastinum which are stable. There is a prominent right hilar node which was not well assessed on previous unenhanced imaging. 2. Atherosclerotic change in the thoracic aorta. 3. Evaluation for pulmonary emboli is limited due to artifact but no emboli are identified. Findings called to the patient's ER doctor, Dr. PMaryan Rued Aortic Atherosclerosis (ICD10-I70.0). Electronically Signed   By: DDorise BullionIII M.D   On: 05/02/2018 18:02   Ct Abdomen Pelvis W Contrast  Result Date: 05/05/2018 CLINICAL DATA:  Abdominal pain. Fever. Abscess suspected. Unintentional weight loss. EXAM: CT ABDOMEN AND PELVIS WITH CONTRAST TECHNIQUE: Multidetector CT imaging of the abdomen and pelvis was performed using the standard protocol following bolus administration of intravenous contrast. CONTRAST:  100 mL ISOVUE-300 IOPAMIDOL (ISOVUE-300) INJECTION 61%, 1070mOMNIPAQUE IOHEXOL 300 MG/ML SOLN COMPARISON:  01/11/2018 FINDINGS: Lower  chest: Lung bases show numerous centrilobular nodular opacities, new since the prior exam. There is a small area patchy ground-glass opacity the posterolateral right middle lobe. Minimal right pleural effusion. There is dependent opacity in the posterior right lower lobe adjacent to the pleural effusion that is most likely atelectasis. Hepatobiliary: Liver normal in size and attenuation with no mass or focal lesion. There is gallbladder wall edema and pericholecystic fluid. No visible gallstones. No bile duct dilation. Pancreas: Unremarkable. No pancreatic ductal dilatation or surrounding inflammatory changes. Spleen: Normal in size without focal abnormality. Adrenals/Urinary Tract: 2.6 cm right adrenal mass, stable. Normal left adrenal gland. Subtle areas of relative hypoattenuation in both kidneys, most  apparent in the lateral midpole of the left kidney. These may reflect areas bright is. There are new since prior exam stable 11 mm lower pole left renal mass consistent with a cyst. No other discrete renal masses. No stones. No hydronephrosis. Ureters are normal course and in caliber. Bladder is unremarkable. Stomach/Bowel: Stomach is unremarkable. No bowel dilation or wall thickening. No convincing inflammation. Normal appendix visualized. Vascular/Lymphatic: No pathologically enlarged lymph nodes. Aortic atherosclerosis. No aneurysm. Reproductive: Unremarkable. Other: Small amount of ascites, less than was present on the prior CT. There is no defined collection to suggest an abscess. Hazy opacities noted throughout the peritoneal and omental fat similar to the prior exam. Calcified peritoneal soft tissue mass in the left central abdomen adjacent to a loop of small bowel is stable from the prior study. Musculoskeletal: No fracture or acute finding. No osteoblastic or osteolytic lesions. IMPRESSION: 1. No evidence of an abscess. 2. There are new bilateral centrilobular pulmonary nodules. Although the differential diagnosis is relatively broad, since these are new, infection with endobronchial spread such as from non tubercular mycobacteria or aspergillosis is suspected. Hypersensitivity pneumonitis is an alternative diagnosis. This may be the source of fever. 3. Ascites, decreased when compared the prior CT. 4. Areas of subtle relative decreased attenuation in both kidneys, which could be due to pyelonephritis. No evidence of a renal or perirenal abscess. 5. Gallbladder wall thickening that is most likely nonspecific edema related to ascites. 6. No evidence of bowel inflammation. 7. Stable calcified mesenteric nodule. Electronically Signed   By: Lajean Manes M.D.   On: 05/05/2018 07:20   Dg Chest Port 1 View  Result Date: 05/23/2018 CLINICAL DATA:  Shortness of breath and fever. EXAM: PORTABLE CHEST 1 VIEW  COMPARISON:  05/10/2018 and prior chest radiographs FINDINGS: Cardiomediastinal silhouette is unchanged. Bilateral airspace opacities, greatest in the UPPER lungs, have not significantly changed. No pleural effusion or pneumothorax. No acute bony abnormalities identified. IMPRESSION: Unchanged bilateral airspace opacities, UPPER lobe predominance. Electronically Signed   By: Margarette Canada M.D.   On: 05/23/2018 15:55   Dg Chest Port 1 View  Result Date: 05/10/2018 CLINICAL DATA:  Shortness of breath. EXAM: PORTABLE CHEST 1 VIEW COMPARISON:  Chest CT 05/02/2018 and chest radiograph 05/02/2018. FINDINGS: Cardiomediastinal silhouette is normal. Mediastinal contours appear intact. Low lung volumes. Worsening aeration of the lungs with increasing bilateral upper lobe predominant airspace consolidation, and cavitary spaces in the upper lobes. Osseous structures are without acute abnormality. Soft tissues are grossly normal. IMPRESSION: Worsening aeration of the lungs with increasing bilateral upper lobe predominant airspace consolidation, and cavitary spaces in the upper lobes. Electronically Signed   By: Fidela Salisbury M.D.   On: 05/10/2018 09:40   Ct Bone Marrow Biopsy & Aspiration  Result Date: 05/10/2018 INDICATION: 67 year old male with sarcoidosis, thrombocytopenia an active  TB. He presents for bone marrow biopsy and culture. EXAM: CT GUIDED BONE MARROW ASPIRATION AND CORE BIOPSY Interventional Radiologist:  Criselda Peaches, MD MEDICATIONS: None. ANESTHESIA/SEDATION: Moderate (conscious) sedation was employed during this procedure. A total of 1.5 milligrams versed and 50 micrograms fentanyl were administered intravenously. The patient's level of consciousness and vital signs were monitored continuously by radiology nursing throughout the procedure under my direct supervision. Total monitored sedation time: 10 minutes FLUOROSCOPY TIME:  Fluoroscopy Time: 0 minutes 0 seconds (0 mGy). COMPLICATIONS: None  immediate. Estimated blood loss: <25 mL PROCEDURE: Informed written consent was obtained from the patient after a thorough discussion of the procedural risks, benefits and alternatives. All questions were addressed. Maximal Sterile Barrier Technique was utilized including caps, mask, sterile gowns, sterile gloves, sterile drape, hand hygiene and skin antiseptic. A timeout was performed prior to the initiation of the procedure. The patient was positioned prone and non-contrast localization CT was performed of the pelvis to demonstrate the iliac marrow spaces. Maximal barrier sterile technique utilized including caps, mask, sterile gowns, sterile gloves, large sterile drape, hand hygiene, and betadine prep. Under sterile conditions and local anesthesia, an 11 gauge coaxial bone biopsy needle was advanced into the right iliac marrow space. Needle position was confirmed with CT imaging. Initially, bone marrow aspiration was performed. Next, the 11 gauge outer cannula was utilized to obtain a right iliac bone marrow core biopsy. Needle was removed. Hemostasis was obtained with compression. The patient tolerated the procedure well. Samples were prepared with the cytotechnologist. IMPRESSION: Technically successful CT-guided bone marrow biopsy, aspiration and bone culture. Signed, Criselda Peaches, MD [facility] Electronically Signed   By: Jacqulynn Cadet M.D.   On: 05/10/2018 09:53   [place] Ekg Site Rite  Result Date: 05/29/2018 If Site Rite image not attached, placement could not be confirmed due to current cardiac rhythm.  US Abdomen Limited Ruq  Result Date: 05/09/2018 CLINICAL DATA:  Abnormal LFTs EXAM: ULTRASOUND ABDOMEN LIMITED RIGHT UPPER QUADRANT COMPARISON:  CT 05/05/2018 FINDINGS: Gallbladder: Marked gallbladder wall thickening measuring up to 9 mm. No stones or sonographic Murphy's sign. Common bile duct: Diameter: Normal caliber, 4 mm Liver:  No focal lesion identified. Within normal limits in parenchymal echogenicity. Portal vein is patent on color Doppler imaging with normal direction of blood flow towards the liver. Also noted is a right pleural effusion and ascites. IMPRESSION: Markedly thickened gallbladder wall. No gallstones visualized. This could be related to liver disease or cholecystitis. Right pleural effusion, mild perihepatic ascites. Electronically Signed   By: Rolm Baptise M.D.   On: 05/09/2018 08:31    Time Spent in minutes  25   Sanjuanita Condrey M.D on 05/31/2018 at 3:02 PM  Between 7am to 7pm - Pager - [phone]  After 7pm go to [email or web address] - password [facility]  [facility] -  Office  [phone]

## 2018-06-01 DIAGNOSIS — Z515 Encounter for palliative care: Secondary | ICD-10-CM

## 2018-06-01 DIAGNOSIS — E43 Unspecified severe protein-calorie malnutrition: Secondary | ICD-10-CM

## 2018-06-01 DIAGNOSIS — R1310 Dysphagia, unspecified: Secondary | ICD-10-CM

## 2018-06-01 DIAGNOSIS — A150 Tuberculosis of lung: Secondary | ICD-10-CM

## 2018-06-01 LAB — MTB SUSCEPTIBILITY BROTH, REFLEXED

## 2018-06-01 LAB — CULTURE, BLOOD (ROUTINE X 2)
Culture: NO GROWTH
Culture: NO GROWTH
SPECIAL REQUESTS: ADEQUATE
Special Requests: ADEQUATE

## 2018-06-01 LAB — AFB ORGANISM ID BY DNA PROBE
M avium complex: NEGATIVE
M tuberculosis complex: POSITIVE — AB

## 2018-06-01 LAB — ACID FAST CULTURE WITH REFLEXED SENSITIVITIES (MYCOBACTERIA)

## 2018-06-01 LAB — GLUCOSE, CAPILLARY
GLUCOSE-CAPILLARY: 129 mg/dL — AB (ref 70–99)
GLUCOSE-CAPILLARY: 132 mg/dL — AB (ref 70–99)
GLUCOSE-CAPILLARY: 167 mg/dL — AB (ref 70–99)
GLUCOSE-CAPILLARY: 168 mg/dL — AB (ref 70–99)
Glucose-Capillary: 130 mg/dL — ABNORMAL HIGH (ref 70–99)
Glucose-Capillary: 133 mg/dL — ABNORMAL HIGH (ref 70–99)

## 2018-06-01 LAB — MAGNESIUM
MAGNESIUM: 1.4 mg/dL — AB (ref 1.7–2.4)
Magnesium: 1.5 mg/dL — ABNORMAL LOW (ref 1.7–2.4)

## 2018-06-01 LAB — PHOSPHORUS
PHOSPHORUS: 2.8 mg/dL (ref 2.5–4.6)
Phosphorus: 2.7 mg/dL (ref 2.5–4.6)

## 2018-06-01 LAB — ACID FAST CULTURE WITH REFLEXED SENSITIVITIES: ACID FAST CULTURE - AFSCU3: POSITIVE — AB

## 2018-06-01 MED ORDER — CALCIUM CARBONATE ANTACID 500 MG PO CHEW
400.0000 mg | CHEWABLE_TABLET | Freq: Two times a day (BID) | ORAL | Status: DC
  Administered 2018-06-01 – 2018-06-05 (×8): 400 mg via ORAL
  Filled 2018-06-01 (×9): qty 2

## 2018-06-01 MED ORDER — MAGIC MOUTHWASH
10.0000 mL | Freq: Three times a day (TID) | ORAL | Status: DC
  Administered 2018-06-01 – 2018-06-05 (×10): 10 mL via ORAL
  Filled 2018-06-01 (×11): qty 10

## 2018-06-01 MED ORDER — ESCITALOPRAM OXALATE 10 MG PO TABS: 5.0000 mg | ORAL_TABLET | Freq: Every day | ORAL | Status: DC

## 2018-06-01 NOTE — Consult Note (Signed)
Consultation Note Date: 06/01/2018   Patient Name: Jeffrey Hayes  DOB: 09/02/1951  MRN: [id number]  Age / Sex: 67 y.o., male  PCP: [name], MD Referring Physician: Florencia Reasons, MD  Reason for Consultation: Establishing goals of care, PEG?  HPI/Patient Profile: 67 y.o. 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  He has been losing weight over the last year and had great difficulty with eating and appetite.  He has become too weak to get out of bed.  Clinical Assessment and Goals of Care:  I have reviewed medical records including EPIC notes, labs and imaging, received report from the care team, assessed the patient and then met at the bedside along with his wife Juliann Pulse  to discuss diagnosis prognosis, [place], EOL wishes, disposition and options.  I introduced Palliative Medicine as specialized medical care for people living with serious illness. It focuses on providing relief from the symptoms and stress of a serious illness. The goal is to improve quality of life for both the patient and the family.  We discussed a brief life review of the patient.  Jeffrey Hayes has two children (Jerron and [place] in [place]) and 1 step son.  He has been married to [place] for 30+ years.  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  When he feels well he has a keen interest in having best lawn in the neighborhood and he is a huge Warehouse manager.  As far as functional and nutritional status Juliann Pulse tells me he lost from 205 to 160.  He has been so weak from diarrhea that he does not get out of bed.  He underwent paracentesis x 3 for abdominal fluid.  Initially his appetite was poor due to loss of taste and smell.  Then he developed dysphagia, now he seems very depressed.  He can swallow - it hurts and he  doesn't have the desire to eat.  We discussed his current illness and what it means in the larger context of his on-going co-morbidities.  Natural disease trajectory and expectations at EOL were discussed.  Juliann Pulse understands that he is very sick due to TB and sarcoid.  She also understands that his nutrition is extremely poor.  She was not surprised to hear that if we can't turn his desire to eat around in the next week or so that the medical team may recommend comfort - as a more permanent feeding tube would likely not be helpful.    I asked her to consider over the next week how he would feel about this.   Her feeling today is that as of today, Jeffrey Hayes is "just giving up".  Advanced directives, concepts specific to code status, artifical feeding and hydration, were considered and discussed.  Juliann Pulse explained that Jeffrey Hayes took care of his brother for 3 years prior to his death.  Because of this Jeffrey Hayes feels like "when it is his  time, let him go".  She opted for DNR - if he passes away please let him be.  Questions and concerns were addressed.  Hard Choices booklet left for review. The family was encouraged to call with questions or concerns.    Primary Decision Maker:  NEXT OF KIN wife    SUMMARY OF RECOMMENDATIONS     Will release restraints while family is present.  Liberalize diet (if he wants ice cream or sweets - its ok)  Will add tums for significant acid reflux  Already on fluconazole for thrush  Will add magic mouth wash  Considered SSRI for appetite and depression - but concerned for QT prolongation with fluconazole (Thanks Pharmacy :))  Change Code status to DNR  PMT Follow up on Monday 7/1   Code Status/Advance Care Planning:  DNR   Symptom Management:   As above.   Psycho-social/Spiritual:   Desire for further Chaplaincy support: yes  Prognosis:  Difficult to determine.  If he does not eat and improve his nutritional status he likely has weeks to months.  Discharge  Planning: To Be Determined  SNF if he can not walk.  Hospice House if he declines and refuses to eat.      Primary Diagnoses: Present on Admission: . Sepsis ([place]) . Thrombocytopenia ([place]) . Sarcoidosis . Pulmonary tuberculosis . Hyponatremia . Hypertension . Diarrhea   I have reviewed the medical record, interviewed the patient and family, and examined the patient. The following aspects are pertinent.  Past Medical History:  Diagnosis Date  . Arthritis   . Diabetes mellitus without complication ([place])   . Hypertension   . Sarcoidosis    Social History   Socioeconomic History  . Marital status: Married    Spouse name: Not on file  . Number of children: Not on file  . Years of education: Not on file  . Highest education level: Not on file  Occupational History  . Occupation: Retired  Scientific laboratory technician  . Financial resource strain: Not on file  . Food insecurity:    Worry: Not on file    Inability: Not on file  . Transportation needs:    Medical: Not on file    Non-medical: Not on file  Tobacco Use  . Smoking status: Former Smoker    Packs/day: 0.75    Years: 50.00    Pack years: 37.50    Types: Cigarettes    Last attempt to quit: 12/06/2016    Years since quitting: 1.4  . Smokeless tobacco: Never Used  Substance and Sexual Activity  . Alcohol use: Not Currently  . Drug use: No  . Sexual activity: Not on file  Lifestyle  . Physical activity:    Days per week: Not on file    Minutes per session: Not on file  . Stress: Not on file  Relationships  . Social connections:    Talks on phone: Not on file    Gets together: Not on file    Attends religious service: Not on file    Active member of club or organization: Not on file    Attends meetings of clubs or organizations: Not on file    Relationship status: Not on file  Other Topics Concern  . Not on file  Social History Narrative   Lives with wife.   Retired.    Family History  Problem Relation Age of Onset   . Lung disease Father   . Lung disease Brother   . Alcohol abuse Brother   .  Drug abuse Brother    Scheduled Meds: . baclofen  5 mg Oral TID  . cholestyramine light  4 g Oral BID AC  . enoxaparin (LOVENOX) injection  40 mg Subcutaneous Q24H  . escitalopram  5 mg Oral Daily  . ethambutol  1,200 mg Oral Daily  . feeding supplement  1 Container Oral TID BM  . isoniazid  300 mg Oral Daily  . magnesium oxide  400 mg Oral Daily  . multivitamin with minerals  1 tablet Oral Daily  . prochlorperazine  5 mg Oral TID AC  . pyrazinamide  1,500 mg Oral Daily  . pyridOXINE  50 mg Oral Daily   Continuous Infusions: . sodium chloride 10 mL/hr at 05/25/18 1311  . sodium chloride 10 mL/hr at 05/31/18 1247  . feeding supplement (VITAL 1.5 CAL) 30 mL/hr at 06/01/18 0228  . fluconazole (DIFLUCAN) IV Stopped (05/31/18 1514)  . rifampin (RIFADIN) IVPB 600 mg (06/01/18 1003)   PRN Meds:.acetaminophen **OR** acetaminophen, diphenoxylate-atropine, gi cocktail, HYDROmorphone (DILAUDID) injection, loperamide, promethazine, sodium chloride flush Allergies  Allergen Reactions  . Tape Other (See Comments)    Per the patient's family, his skin is VERY FRAGILE & TEARS EASILY. PLEASE use an alternative to tape.   Review of Systems complains of restraints.  Acid reflux, wife complains of hypersomnolence.  Physical Exam  Well developed thin make, quiet, acts sedate. CV rrr resp no distress Abdomen soft, nt, nd   Vital Signs: BP 116/80 (BP Location: Right Arm)   Pulse 92   Temp 98.6 F (37 C) (Oral)   Resp 18   Ht 6' 2"  (1.88 m)   Wt 64.6 kg (142 lb 6.7 oz)   SpO2 98%   BMI 18.29 kg/m  Pain Scale: 0-10 POSS *See Group Information*: 1-Acceptable,Awake and alert Pain Score: 8    SpO2: SpO2: 98 % O2 Device:SpO2: 98 % O2 Flow Rate: .   IO: Intake/output summary:   Intake/Output Summary (Last 24 hours) at 06/01/2018 1131 Last data filed at 06/01/2018 0830 Gross per 24 hour  Intake 1082.42 ml    Output 850 ml  Net 232.42 ml    LBM: Last BM Date: 05/31/18 Baseline Weight: Weight: 68.5 kg (151 lb) Most recent weight: Weight: 64.6 kg (142 lb 6.7 oz)     Palliative Assessment/Data: 30%     Time In: 10:30 Time Out: 11:50 Time Total: 80 min. Greater than 50%  of this time was spent counseling and coordinating care related to the above assessment and plan.  Signed by: Florentina Jenny, PA-C Palliative Medicine Pager: [phone]  Please contact Palliative Medicine Team phone at [phone] for questions and concerns.  For individual provider: See Shea Evans

## 2018-06-01 NOTE — Progress Notes (Signed)
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  Being addressed via TF   GOAL:   Patient will meet greater than or equal to 90% of their needs  Progressing   MONITOR:   PO intake, Supplement acceptance, Labs, Weight trends  REASON FOR ASSESSMENT:   Malnutrition Screening Tool    ASSESSMENT:   67 yo male admitted with chest pain, fever and dairrhea with sepsis due to bilateral cavitary pneumonia with active TB, hx sarcoidosis. Pt with additional hx of HTN, DM  Palliative care met with wife today; notes reviewed. PT now DNR, continuing current poc. Noted palliative care to follow-up again on Monday  Pt eating minimally, pt did eat honey bun at breakfast this AM. Taking meds with applesauce  Cortrak tube successfully placed yesterday, tip in fundus Vital 1.5 initiated at 20 ml/hr today, currently infusing at 30 ml/hr with plans to titrate to 40 ml/hr  Noted per Infectious disease MD, plan for enteral nutrition for 7-10 days to see if any improvement  Pt unable to have DG esophagus due to airborne isolation status  Difficulty swallowing, possible thrush, reflux, diarrhea, nausea all being addressed by MD  Labs: phosphorus wdl, magnesium 1.5, no potassium Meds: prevalite, tums, diflucan, B-6, MVI, mag-ox, magic mouthwash, imodium  Diet Order:   Diet Order           DIET DYS 3 Room service appropriate? Yes; Fluid consistency: Thin  Diet effective now          EDUCATION NEEDS:    Education needs have been addressed  Skin:  Skin Integrity Issues:: Stage II, Unstageable Stage II: sacrum Unstageable: penis Incisions: n/a  Last BM:  6/27  Height:   Ht Readings from Last 1 Encounters:  05/24/18 6' 2"  (1.88 m)    Weight:   Wt Readings from Last 1 Encounters:  06/01/18 142 lb 6.7 oz (64.6 kg)    Ideal Body Weight:     BMI:  Body mass index is 18.29 kg/m.  Estimated Nutritional Needs:   Kcal:  2200-2400 kcals   Protein:  100-120 g  Fluid:  >/= 2.2 L    Kerman Passey MS, RD, LDN, CNSC [phone] Pager  [phone] Weekend/On-Call Pager

## 2018-06-01 NOTE — Progress Notes (Signed)
[facility] for Infectious Disease    Date of Admission:  05/23/2018   Total days of antibiotics 29          ID: Jeffrey Hayes is a 67 y.o. male with disseminated mtb and anorexia with severe protein caloric malnutrition Principal Problem:   Sepsis ([place]) Active Problems:   Diabetes mellitus without complication ([place])   Sarcoidosis   Hypertension   Thrombocytopenia ([place])   Hyponatremia   Pulmonary tuberculosis   Diarrhea   Pressure injury of skin   Hypoglycemia   Dysphagia   TB (pulmonary tuberculosis)   Palliative care encounter    Subjective: Afebrile. Had dobhoff placed last night with tube feed initiation. He has not had much to eat today. ( none of his lunch, a bite of breakfast) his wife has met with palliative care team. Medications:  . baclofen  5 mg Oral TID  . calcium carbonate  400 mg of elemental calcium Oral BID WC  . cholestyramine light  4 g Oral BID AC  . enoxaparin (LOVENOX) injection  40 mg Subcutaneous Q24H  . ethambutol  1,200 mg Oral Daily  . feeding supplement  1 Container Oral TID BM  . isoniazid  300 mg Oral Daily  . magic mouthwash  10 mL Oral TID  . magnesium oxide  400 mg Oral Daily  . multivitamin with minerals  1 tablet Oral Daily  . prochlorperazine  5 mg Oral TID AC  . pyrazinamide  1,500 mg Oral Daily  . pyridOXINE  50 mg Oral Daily    Objective: Vital signs in last 24 hours: Temp:  [98.1 F (36.7 C)-98.6 F (37 C)] 98.6 F (37 C) (06/27 0814) Pulse Rate:  [92-102] 92 (06/27 0814) Resp:  [16-18] 18 (06/27 0814) BP: (103-116)/(71-80) 116/80 (06/27 0814) SpO2:  [94 %-98 %] 98 % (06/27 0814) Weight:  [142 lb 6.7 oz (64.6 kg)] 142 lb 6.7 oz (64.6 kg) (06/27 0500) Physical Exam  Constitutional: He is oriented to person. He appears cachetic, chronically ill, malnourished. No distress.  HENT: bridled ng, thrush Mouth/Throat: Oropharynx is clear and moist. No oropharyngeal exudate.  Cardiovascular: Normal rate, regular rhythm and  normal heart sounds. Exam reveals no gallop and no friction rub.  No murmur heard.  Pulmonary/Chest: Effort normal and breath sounds normal. No respiratory distress. He has no wheezes.  Abdominal: Soft. Bowel sounds are normal. He exhibits no distension. There is no tenderness.  Skin: Skin is warm and dry. No rash noted. No erythema.  Psychiatric: He has a normal mood and affect. His behavior is normal.    Lab Results Recent Labs    05/30/18 0530  WBC 7.3  HGB 10.4*  HCT 31.7*  NA 137  K 3.3*  CL 105  CO2 22  BUN <5*  CREATININE 0.80   Liver Panel Recent Labs    05/30/18 0530  PROT 4.7*  ALBUMIN 1.4*  AST 80*  ALT 28  ALKPHOS 572*  BILITOT 1.4*    Microbiology: reviewed Studies/Results: Dg Abd Portable 1v  Result Date: 05/31/2018 CLINICAL DATA:  Feeding tube placement EXAM: PORTABLE ABDOMEN - 1 VIEW COMPARISON:  05/23/2018 CT and 12/23/2017 radiographs FINDINGS: The tip of a feeding tube is seen in the left upper quadrant of the abdomen, projecting over the expected location of the gastric fundus. No evidence of bowel obstruction. No free air. Lower lumbar facet arthropathy and degenerative disc disease. IMPRESSION: Feeding tube tip is seen projecting over the expected location of the  gastric fundus. Electronically Signed   By: Ashley Royalty M.D.   On: 05/31/2018 17:47     Assessment/Plan: Disseminated mtb = continue with ripe plus vit b6. Keep Rifampin as an IV for now  Severe malnutrition = continue with tube feeds and watch for refeeding syndrome. Would like to do a trial of 7-10d to see if any improvement   Family and patient now the severity of illness and understand if there is no improvement over the next week that we can readdress goals of care  [place] [facility] for Infectious Diseases Cell: [phone] Pager: [phone]  06/01/2018, 3:17 PM

## 2018-06-01 NOTE — Progress Notes (Signed)
PMT meeting at 10:30 today with wife  Jeffrey Hayes, [place] [facility] Pager: [phone]

## 2018-06-01 NOTE — Progress Notes (Addendum)
Patient had requested prayer. I prayed for wisdom and guidance for doctors and all care givers and for patient to have peace of mind and heart.Conard Novak, Chaplain   06/01/18 2000  Clinical Encounter Type  Visited With Patient  Visit Type Initial;Spiritual support  Referral From Patient  Consult/Referral To Chaplain  Spiritual Encounters  Spiritual Needs Prayer;Emotional  Stress Factors  Patient Stress Factors Health changes  Family Stress Factors None identified

## 2018-06-01 NOTE — Consult Note (Signed)
   [facility] CM Inpatient Consult   06/01/2018  ABDEL EFFINGER December 10, 1950 [id number]   Patient screened for extreme high score. Chart reviewed for care management needs.  Appreciate Palliative consult and  Review of notes as well. Medicare/Next Gen ACO. Patient likely for Palliative Care verses comfort care/Hospice. Will follow progress.  For questions contact:   Natividad Brood, RN BSN [facility] Liaison  [phone] business mobile phone Toll free office [phone]

## 2018-06-01 NOTE — Progress Notes (Addendum)
PROGRESS NOTE  TRES GRZYWACZ JXB:[id number] DOB: 1951-11-28 DOA: 05/23/2018 PCP: [name], MD  HPI/Recap of past 24 hours:  Tolerating cortack tube feeding He denies pain  Assessment/Plan: Principal Problem:   Sepsis ([place]) Active Problems:   Diabetes mellitus without complication ([place])   Sarcoidosis   Hypertension   Thrombocytopenia ([facility])   Hyponatremia   Pulmonary tuberculosis   Diarrhea   Pressure injury of skin   Hypoglycemia   Dysphagia   TB (pulmonary tuberculosis)   Palliative care encounter  Sepsis ([place]) Secondary to active tuberculosis in the setting of sarcoidosis /immunosuppressed status and bilateral cavitary pneumonia. On RI PE antitubercular meds along with vitamin B6.  Switched rifampin to IV for better penetration ID in put appreciated  Acute metabolic encephalopathy:  Improving , Today he is alert and oriented x3  Type 2 diabetes mellitus A1c of 11.8.  CBG currently stable on sliding scale.  Amaryl and metformin discontinued during last hospitalization.  Microcytic anemia hgb stable at baseline around 10 will need to avoid iron supplement  For now in the setting of acute infection  Severe protein calorie malnutrition Pulled out PICC line on 6/25.  Will need core track placed in tomorrow and start tube feed.  High risk for refeeding syndrome.   palliative care input appreciated, he is DNR , continue cortrak tube feeds, readdress goal of care after a trial of 7-10days of tube feeds Check bmp/mag/phos in am, avoid refeeding syndrome    Code Status: DNR  Family Communication: patient   Disposition Plan: not ready to discharge   Consultants:  ID  Palliative care  Procedures:  cortack placement and tube feeding  Antibiotics:  Per ID   Objective: BP 120/85 (BP Location: Left Arm)   Pulse (!) 103   Temp (!) 100.4 F (38 C) (Oral)   Resp 18   Ht 6\' 2"  (1.88 m)   Wt 64.6 kg (142 lb 6.7 oz)   SpO2 97%   BMI 18.29 kg/m    Intake/Output Summary (Last 24 hours) at 06/01/2018 2103 Last data filed at 06/01/2018 1852 Gross per 24 hour  Intake 1142.42 ml  Output 1600 ml  Net -457.58 ml   Filed Weights   05/29/18 2029 05/30/18 2041 06/01/18 0500  Weight: 64 kg (141 lb 1.5 oz) 65 kg (143 lb 4.8 oz) 64.6 kg (142 lb 6.7 oz)    Exam: Patient is examined daily including today on 06/01/2018, exams remain the same as of yesterday except that has changed    General:  Very weak, malnourished , aaox3 today  Cardiovascular: RRR  Respiratory: CTABL  Abdomen: Soft/ND/NT, positive BS  Musculoskeletal: No Edema  Neuro: alert, oriented   Data Reviewed: Basic Metabolic Panel: Recent Labs  Lab 05/26/18 0740 05/27/18 1017 05/28/18 0705 05/30/18 0530 05/31/18 1435 05/31/18 1734 06/01/18 0644  NA 137 136 137 137  --   --   --   K 3.5 3.8 3.3* 3.3*  --   --   --   CL 115* 110 112* 105  --   --   --   CO2 17* 17* 18* 22  --   --   --   GLUCOSE 97 119* 136* 105*  --   --   --   BUN 7 6 5* <5*  --   --   --   CREATININE 0.83 0.82 0.76 0.80  --   --   --   CALCIUM 6.9* 7.0* 6.8* 7.2*  --   --   --  MG  --   --   --  1.5* 1.5* 1.5* 1.5*  PHOS  --   --   --  1.8* 2.9 2.9 2.7   Liver Function Tests: Recent Labs  Lab 05/26/18 0740 05/27/18 1017 05/28/18 0705 05/30/18 0530  AST 80* 82* 74* 80*  ALT 30 30 26 28   ALKPHOS 403* 487* 445* 572*  BILITOT 1.0 1.3* 1.3* 1.4*  PROT 4.2* 4.8* 4.5* 4.7*  ALBUMIN 1.3* 1.4* 1.3* 1.4*   No results for input(s): LIPASE, AMYLASE in the last 168 hours. Recent Labs  Lab 05/30/18 1155  AMMONIA 42*   CBC: Recent Labs  Lab 05/26/18 0740 05/28/18 0705 05/30/18 0530  WBC 11.5* 9.3 7.3  HGB 10.2* 10.7* 10.4*  HCT 31.2* 31.8* 31.7*  MCV 78.0 76.8* 76.9*  PLT 136* 163 244   Cardiac Enzymes:   No results for input(s): CKTOTAL, CKMB, CKMBINDEX, TROPONINI in the last 168 hours. BNP (last 3 results) Recent Labs    05/24/18 0852  BNP 95.3    ProBNP (last 3  results) No results for input(s): PROBNP in the last 8760 hours.  CBG: Recent Labs  Lab 06/01/18 0039 06/01/18 0437 06/01/18 0811 06/01/18 1141 06/01/18 1702  GLUCAP 129* 130* 132* 167* 168*    Recent Results (from the past 240 hour(s))  Culture, blood (Routine x 2)     Status: None   Collection Time: 05/23/18  2:53 PM  Result Value Ref Range Status   Specimen Description BLOOD RIGHT ANTECUBITAL  Final   Special Requests   Final    BOTTLES DRAWN AEROBIC AND ANAEROBIC Blood Culture adequate volume   Culture   Final    NO GROWTH 5 DAYS Performed at [facility], [place] [address], [place], [place] [address]    Report Status 05/28/2018 FINAL  Final  Culture, blood (Routine x 2)     Status: None   Collection Time: 05/23/18  3:43 PM  Result Value Ref Range Status   Specimen Description BLOOD BLOOD LEFT FOREARM  Final   Special Requests   Final    BOTTLES DRAWN AEROBIC ONLY Blood Culture results may not be optimal due to an inadequate volume of blood received in culture bottles   Culture   Final    NO GROWTH 5 DAYS Performed at [facility], [place] [address], [place], [place] [address]    Report Status 05/28/2018 FINAL  Final  Gastrointestinal Panel by PCR , Stool     Status: None   Collection Time: 05/23/18 10:45 PM  Result Value Ref Range Status   Campylobacter species NOT DETECTED NOT DETECTED Final   Plesimonas shigelloides NOT DETECTED NOT DETECTED Final   Salmonella species NOT DETECTED NOT DETECTED Final   Yersinia enterocolitica NOT DETECTED NOT DETECTED Final   Vibrio species NOT DETECTED NOT DETECTED Final   Vibrio cholerae NOT DETECTED NOT DETECTED Final   Enteroaggregative E coli (EAEC) NOT DETECTED NOT DETECTED Final   Enteropathogenic E coli (EPEC) NOT DETECTED NOT DETECTED Final   Enterotoxigenic E coli (ETEC) NOT DETECTED NOT DETECTED Final   Shiga like toxin producing E coli (STEC) NOT DETECTED NOT DETECTED Final   Shigella/Enteroinvasive E coli  (EIEC) NOT DETECTED NOT DETECTED Final   Cryptosporidium NOT DETECTED NOT DETECTED Final   Cyclospora cayetanensis NOT DETECTED NOT DETECTED Final   Entamoeba histolytica NOT DETECTED NOT DETECTED Final   Giardia lamblia NOT DETECTED NOT DETECTED Final   Adenovirus F40/41 NOT DETECTED NOT DETECTED  Final   Astrovirus NOT DETECTED NOT DETECTED Final   Norovirus GI/GII NOT DETECTED NOT DETECTED Final   Rotavirus A NOT DETECTED NOT DETECTED Final   Sapovirus (I, II, IV, and V) NOT DETECTED NOT DETECTED Final    Comment: Performed at [facility], [address] [place], [place] [address]  C difficile quick scan w PCR reflex     Status: None   Collection Time: 05/23/18 11:00 PM  Result Value Ref Range Status   C Diff antigen NEGATIVE NEGATIVE Final   C Diff toxin NEGATIVE NEGATIVE Final   C Diff interpretation No C. difficile detected.  Final    Comment: Performed at [facility], [place] [address], [place], [place] [address]  Urine culture     Status: None   Collection Time: 05/24/18  1:37 AM  Result Value Ref Range Status   Specimen Description URINE, CLEAN CATCH  Final   Special Requests NONE  Final   Culture   Final    NO GROWTH Performed at [facility], [place] [address] [place], [place] [address]    Report Status 05/25/2018 FINAL  Final  Culture, blood (routine x 2)     Status: None   Collection Time: 05/27/18  2:25 PM  Result Value Ref Range Status   Specimen Description BLOOD LEFT ANTECUBITAL  Final   Special Requests   Final    BOTTLES DRAWN AEROBIC AND ANAEROBIC Blood Culture adequate volume   Culture   Final    NO GROWTH 5 DAYS Performed at [facility], [place] [address] [place], [place] [address]    Report Status 06/01/2018 FINAL  Final  Culture, blood (routine x 2)     Status: None   Collection Time: 05/27/18  2:39 PM  Result Value Ref Range Status   Specimen Description BLOOD LEFT HAND  Final   Special Requests   Final    BOTTLES DRAWN  AEROBIC AND ANAEROBIC Blood Culture adequate volume   Culture   Final    NO GROWTH 5 DAYS Performed at [facility], [place] [address], [place], [place] [address]    Report Status 06/01/2018 FINAL  Final     Studies: No results found.  Scheduled Meds: . baclofen  5 mg Oral TID  . calcium carbonate  400 mg of elemental calcium Oral BID WC  . cholestyramine light  4 g Oral BID AC  . enoxaparin (LOVENOX) injection  40 mg Subcutaneous Q24H  . ethambutol  1,200 mg Oral Daily  . feeding supplement  1 Container Oral TID BM  . isoniazid  300 mg Oral Daily  . magic mouthwash  10 mL Oral TID  . magnesium oxide  400 mg Oral Daily  . multivitamin with minerals  1 tablet Oral Daily  . prochlorperazine  5 mg Oral TID AC  . pyrazinamide  1,500 mg Oral Daily  . pyridOXINE  50 mg Oral Daily    Continuous Infusions: . sodium chloride 10 mL/hr at 05/25/18 1311  . sodium chloride 10 mL/hr at 05/31/18 1247  . feeding supplement (VITAL 1.5 CAL) 65 mL/hr at 06/01/18 1203  . fluconazole (DIFLUCAN) IV 400 mg (06/01/18 1202)  . rifampin (RIFADIN) IVPB 600 mg (06/01/18 1003)     Time spent: 68mins, case discussed with palliative care I have personally reviewed and interpreted on  06/01/2018 daily labs, tele strips, imagings as discussed above under date review session and assessment and plans.  I reviewed  all nursing notes, pharmacy notes, consultant notes,  vitals, pertinent old records  I have discussed plan of care as described above with RN , patient  on 06/01/2018   Florencia Reasons MD, PhD  [facility] Pager [phone]. If 7PM-7AM, please contact night-coverage at [email or web address], password [facility] 06/01/2018, 9:03 PM  LOS: 9 days

## 2018-06-02 ENCOUNTER — Inpatient Hospital Stay (HOSPITAL_COMMUNITY): Payer: Medicare Other

## 2018-06-02 LAB — MAGNESIUM: MAGNESIUM: 1.5 mg/dL — AB (ref 1.7–2.4)

## 2018-06-02 LAB — BASIC METABOLIC PANEL
Anion gap: 9 (ref 5–15)
BUN: <5 mg/dL — ABNORMAL LOW (ref 8–23)
CHLORIDE: 105 mmol/L (ref 98–111)
CO2: 22 mmol/L (ref 22–32)
Calcium: 7.5 mg/dL — ABNORMAL LOW (ref 8.9–10.3)
Creatinine, Ser: 0.59 mg/dL — ABNORMAL LOW (ref 0.61–1.24)
GFR calc non Af Amer: >60 mL/min (ref >60)
Glucose, Bld: 185 mg/dL — ABNORMAL HIGH (ref 70–99)
POTASSIUM: 4.6 mmol/L (ref 3.5–5.1)
SODIUM: 136 mmol/L (ref 135–145)

## 2018-06-02 LAB — GLUCOSE, CAPILLARY
GLUCOSE-CAPILLARY: 105 mg/dL — AB (ref 70–99)
GLUCOSE-CAPILLARY: 156 mg/dL — AB (ref 70–99)
GLUCOSE-CAPILLARY: 162 mg/dL — AB (ref 70–99)
GLUCOSE-CAPILLARY: 77 mg/dL (ref 70–99)
Glucose-Capillary: 351 mg/dL — ABNORMAL HIGH (ref 70–99)
Glucose-Capillary: 111 mg/dL — ABNORMAL HIGH (ref 70–99)

## 2018-06-02 LAB — URINALYSIS, ROUTINE W REFLEX MICROSCOPIC
Bilirubin Urine: NEGATIVE
GLUCOSE, UA: NEGATIVE mg/dL
HGB URINE DIPSTICK: NEGATIVE
KETONES UR: NEGATIVE mg/dL
LEUKOCYTES UA: NEGATIVE
Nitrite: NEGATIVE
PROTEIN: NEGATIVE mg/dL
Specific Gravity, Urine: 1.009 (ref 1.005–1.030)
pH: 6 (ref 5.0–8.0)

## 2018-06-02 LAB — VITAMIN B1: VITAMIN B1 (THIAMINE): 102 nmol/L (ref 66.5–200.0)

## 2018-06-02 LAB — PHOSPHORUS: PHOSPHORUS: 2.8 mg/dL (ref 2.5–4.6)

## 2018-06-02 MED ORDER — INSULIN ASPART 100 UNIT/ML ~~LOC~~ SOLN
0.0000 [IU] | Freq: Three times a day (TID) | SUBCUTANEOUS | Status: DC
  Administered 2018-06-02: 9 [IU] via SUBCUTANEOUS
  Administered 2018-06-04: 2 [IU] via SUBCUTANEOUS

## 2018-06-02 MED ORDER — GLUCERNA 1.2 CAL PO LIQD
1000.0000 mL | ORAL | Status: DC
  Administered 2018-06-02 – 2018-06-04 (×4): 1000 mL
  Filled 2018-06-02 (×10): qty 1000

## 2018-06-02 MED ORDER — SODIUM CHLORIDE 0.9 % IV BOLUS
1000.0000 mL | Freq: Once | INTRAVENOUS | Status: AC
  Administered 2018-06-02: 1000 mL via INTRAVENOUS

## 2018-06-02 MED ORDER — METOPROLOL TARTRATE 5 MG/5ML IV SOLN
2.5000 mg | Freq: Four times a day (QID) | INTRAVENOUS | Status: DC
  Administered 2018-06-03 – 2018-06-05 (×6): 2.5 mg via INTRAVENOUS
  Filled 2018-06-02 (×8): qty 5

## 2018-06-02 MED ORDER — MAGNESIUM SULFATE 2 GM/50ML IV SOLN
2.0000 g | Freq: Once | INTRAVENOUS | Status: AC
Start: 2018-06-02 — End: 2018-06-02
  Administered 2018-06-02: 2 g via INTRAVENOUS
  Filled 2018-06-02: qty 50

## 2018-06-02 MED ORDER — SODIUM CHLORIDE 0.9 % IV SOLN
INTRAVENOUS | Status: AC
  Administered 2018-06-02 – 2018-06-03 (×2): via INTRAVENOUS

## 2018-06-02 NOTE — Progress Notes (Signed)
[facility] [place] for Infectious Disease    Date of Admission:  05/23/2018   Total days of antibiotics 30        Day 30 RIPE plus Vit b6           ID: Jeffrey Hayes Jeffrey Hayes is a 67 y.o. 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 With concurrent tachycardia.   He also is having diarrhea ( we have backed off on his anti peristalsis, plus we have initiated tube feeds) Medications:  . baclofen  5 mg Oral TID  . calcium carbonate  400 mg of elemental calcium Oral BID WC  . cholestyramine light  4 g Oral BID AC  . enoxaparin (LOVENOX) injection  40 mg Subcutaneous Q24H  . ethambutol  1,200 mg Oral Daily  . feeding supplement  1 Container Oral TID BM  . insulin aspart  0-9 Units Subcutaneous TID WC  . isoniazid  300 mg Oral Daily  . magic mouthwash  10 mL Oral TID  . magnesium oxide  400 mg Oral Daily  . multivitamin with minerals  1 tablet Oral Daily  . prochlorperazine  5 mg Oral TID AC  . pyrazinamide  1,500 mg Oral Daily  . pyridOXINE  50 mg Oral Daily    Objective: Vital signs in last 24 hours: Temp:  [98.3 F (36.8 C)-103.1 F (39.5 C)] 99.6 F (37.6 C) (06/28 1612) Pulse Rate:  [90-162] 162 (06/28 1612) Resp:  [18-44] 26 (06/28 1612) BP: (96-215)/(71-117) 96/75 (06/28 1612) SpO2:  [95 %-97 %] 95 % (06/28 1612) Physical Exam  Constitutional: He is oriented to person, place.cachetic and mal-nourished. No distress.  HENT: dobhoff bridled in his nares Mouth/Throat: Oropharynx is clear and moist. No oropharyngeal  exudate.  Cardiovascular: tachy, regular rhythm and normal heart sounds. Exam reveals no gallop and no friction rub.  No murmur heard.  Pulmonary/Chest: Effort normal and breath sounds normal. No respiratory distress. He has no wheezes.  Abdominal: Soft. Bowel sounds are normal. He exhibits no distension. There is no tenderness.  Lymphadenopathy:  He has no cervical adenopathy.  Neurological: He is alert and oriented to person, place, and time.  Skin: Skin is warm and dry. No rash noted. No erythema.  Psychiatric: He has a normal mood and affect. His behavior is normal.     Lab Results Recent Labs    06/02/18 0915  NA 136  K 4.6  CL 105  CO2 22  BUN <5*  CREATININE 0.59*    Microbiology: reveiwed Studies/Results: Dg Chest Port 1 View  Result Date: 06/02/2018 CLINICAL DATA:  Shortness of breath.  Sepsis. EXAM: PORTABLE CHEST 1 VIEW COMPARISON:  One-view chest x-ray 05/27/2018. FINDINGS: Bilateral upper lobe miliary pneumonia is not significantly changed. A small bore feeding tube courses off the inferior border of the film. There is no pneumothorax. No consolidation or cavitary lesion is present. IMPRESSION: Stable appearance of upper lobe predominant miliary pneumonia compatible with TB. Electronically Signed   By: Wynetta Fines.D.  On: 06/02/2018 16:04   Dg Abd Portable 1v  Result Date: 05/31/2018 CLINICAL DATA:  Feeding tube placement EXAM: PORTABLE ABDOMEN - 1 VIEW COMPARISON:  05/23/2018 CT and 12/23/2017 radiographs FINDINGS: The tip of a feeding tube is seen in the left upper quadrant of the abdomen, projecting over the expected location of the gastric fundus. No evidence of bowel obstruction. No free air. Lower lumbar facet arthropathy and degenerative disc disease. IMPRESSION: Feeding tube tip is seen projecting over the expected location of the gastric fundus. Electronically Signed   By: Ashley Royalty M.D.   On: 05/31/2018 17:47    Assessment/Plan: SIRS =  getting labs including cbc with diff, blood cx, ua,  CXR does not suggest HCAP. Getting ivf to see if it improves tachycardia. If not improving and clinically worsening, can do empiric vanco and cefepime - but unclear what we would be treating at this point.  Presumed eso candidiasis = on IV fluconazole  Disseminated mTB = continue on RIPE  Plus vit b6 ( rifampin is IV for now and can change back to orals if nausea is improved)  Dr Tommy Medal to see over the weekend.   [facility] for Infectious Diseases Cell: [phone] Pager: [phone]  06/02/2018, 5:09 PM

## 2018-06-02 NOTE — Progress Notes (Signed)
Nutrition Follow-up  DOCUMENTATION CODES:   Severe malnutrition in context of chronic illness  INTERVENTION:   Tube Feeding:  Change to Glucerna 1.2 @ 75 ml/hr Provides 2160 kcals, 108 g of protein and 1458 mL of free water  If pt demonstrates signs/symptoms of GI intolerance with change in TF formula, which would include worsening/increased diarrhea, recommend changing formula back to Vital 1.5   NUTRITION DIAGNOSIS:   Severe Malnutrition related to chronic illness(sarcoidosis, now with caviatary penumonia with active TB) as evidenced by percent weight loss, severe fat depletion, severe muscle depletion.  Being addressed via TF   GOAL:   Patient will meet greater than or equal to 90% of their needs  Progressing  MONITOR:   PO intake, Supplement acceptance, Labs, Weight trends  REASON FOR ASSESSMENT:   Malnutrition Screening Tool    ASSESSMENT:   67 yo male admitted with chest pain, fever and dairrhea with sepsis due to bilateral cavitary pneumonia with active TB, hx sarcoidosis. Pt with additional hx of HTN, DM  Hyperglycemia this AM. Noted pt with hx of DM but not receiving coverage. HgbA1c 11.3 this month. Received page regarding changing TF formula. Of note, pt is taking some oral intake although minimal  Lab Results  Component Value Date   HGBA1C 11.8 (H) 05/12/2018   Currently Vital 1.5 infusing at rate of 50 ml/hr with no signs of GI intolerance via Cortrak Diarrhea on admission, no under control   Labs: CBGs 133-351, BMP pending Meds: prevalite, difulcan, ss novolog ordered this AM  Diet Order:   Diet Order           DIET DYS 3 Room service appropriate? Yes; Fluid consistency: Thin  Diet effective now          EDUCATION NEEDS:   Education needs have been addressed  Skin:  Skin Integrity Issues:: Stage II, Unstageable Stage II: sacrum Unstageable: penis Incisions: n/a  Last BM:  6/27  Height:   Ht Readings from Last 1 Encounters:   05/24/18 6\' 2"  (1.88 m)    Weight:   Wt Readings from Last 1 Encounters:  06/01/18 142 lb 6.7 oz (64.6 kg)    Ideal Body Weight:     BMI:  Body mass index is 18.29 kg/m.  Estimated Nutritional Needs:   Kcal:  2200-2400 kcals   Protein:  100-120 g  Fluid:  >/= 2.2 L   Kerman Passey MS, RD, LDN, CNSC [phone] Pager  [phone] Weekend/On-Call Pager

## 2018-06-02 NOTE — Significant Event (Signed)
Rapid Response Event Note  Overview: Time Called: 1512 Arrival Time: 1514 Event Type: Other (Comment)(Fever)  Initial Focused Assessment: Called by RN because patient shivering and currently has a fever. Stated MD asked to RRT to see patient. Upon entering room, patient noted to be in rigor. Patient acutely hypertensive. Lungs clear. Rechecked temp, now increased to 103.1 orally. MD entered room as I was leaving to call her.  Interventions: APAP given by RN. Lab staff at bedside to draw blood cultures. Awaiting radiology for portable cxr.  Plan of Care (if not transferred): Plan as per MD. Event Summary: Name of Physician Notified: Dr Erlinda Hong at 1512  Outcome: Stayed in room and stabalized  Event End Time: [place]

## 2018-06-02 NOTE — Progress Notes (Addendum)
PROGRESS NOTE  Jeffrey Hayes TFT:[id number] DOB: January 30, 1951 DOA: 05/23/2018 PCP: [name], MD  HPI/Recap of past 24 hours:   Am blood glucose elevated Tolerating cortack tube feeding He denies pain He is slightly confused about the time, he reports it is 06/2017  Addendum 3;40pm patient suddenly spike high fever with severe rigors and significant tachycardia heart rate increased to 170's, rr rate in the 40's, sbp above 200 Symptom lasted about 70mins, he received tylenol, now rigor has resolved, vital signs much improved  Assessment/Plan: Principal Problem:   Sepsis ([place]) Active Problems:   Diabetes mellitus without complication ([place])   Sarcoidosis   Hypertension   Thrombocytopenia ([place])   Hyponatremia   Pulmonary tuberculosis   Diarrhea   Pressure injury of skin   Hypoglycemia   Dysphagia   TB (pulmonary tuberculosis)   Palliative care encounter  Sepsis ([place]) Secondary to active tuberculosis in the setting of sarcoidosis /immunosuppressed status and bilateral cavitary pneumonia. On RI PE antitubercular meds along with vitamin B6.  Switched rifampin to IV for better penetration ID in put appreciated  Spiked high fever with significant rigors on 6/28 pm, will check ua/urine culture/blood culture/cxr, will follow result Wife updated at bedside Case discussed with infectious disease over the phone  Acute metabolic encephalopathy:  Improving , Today he is alert and oriented x3  Type 2 diabetes mellitus A1c of 11.8.  CBG currently stable on sliding scale.  Amaryl and metformin discontinued during last hospitalization.  Microcytic anemia hgb stable at baseline around 10 will need to avoid iron supplement  For now in the setting of acute infection  Severe protein calorie malnutrition Pulled out PICC line on 6/25.  Will need core track placed in tomorrow and start tube feed.  High risk for refeeding syndrome.   palliative care input appreciated, he is DNR ,  continue cortrak tube feeds, readdress goal of care after a trial of 7-10days of tube feeds Check bmp/mag/phos in am, avoid refeeding syndrome  Hypomagnesemia: replace mag    Code Status: DNR  Family Communication: patient and wife at bedside   Disposition Plan: not ready to discharge   Consultants:  ID  Palliative care  Procedures:  cortack placement and tube feeding  Antibiotics:  Per ID   Objective: BP 110/71 (BP Location: Left Arm)   Pulse 90   Temp 99 F (37.2 C) (Oral)   Resp 20   Ht 6\' 2"  (1.88 m)   Wt 64.6 kg (142 lb 6.7 oz)   SpO2 97%   BMI 18.29 kg/m   Intake/Output Summary (Last 24 hours) at 06/02/2018 0856 Last data filed at 06/02/2018 0601 Gross per 24 hour  Intake 2914.57 ml  Output 1850 ml  Net 1064.57 ml   Filed Weights   05/29/18 2029 05/30/18 2041 06/01/18 0500  Weight: 64 kg (141 lb 1.5 oz) 65 kg (143 lb 4.8 oz) 64.6 kg (142 lb 6.7 oz)    Exam: Patient is examined daily including today on 06/02/2018, exams remain the same as of yesterday except that has changed    General:  Very weak, malnourished , aaox3 today  Cardiovascular: RRR  Respiratory: CTABL  Abdomen: Soft/ND/NT, positive BS  Musculoskeletal: No Edema  Neuro: alert, oriented   Data Reviewed: Basic Metabolic Panel: Recent Labs  Lab 05/27/18 1017 05/28/18 0705 05/30/18 0530 05/31/18 1435 05/31/18 1734 06/01/18 0644 06/01/18 2132  NA 136 137 137  --   --   --   --   K 3.8  3.3* 3.3*  --   --   --   --   CL 110 112* 105  --   --   --   --   CO2 17* 18* 22  --   --   --   --   GLUCOSE 119* 136* 105*  --   --   --   --   BUN 6 5* <5*  --   --   --   --   CREATININE 0.82 0.76 0.80  --   --   --   --   CALCIUM 7.0* 6.8* 7.2*  --   --   --   --   MG  --   --  1.5* 1.5* 1.5* 1.5* 1.4*  PHOS  --   --  1.8* 2.9 2.9 2.7 2.8   Liver Function Tests: Recent Labs  Lab 05/27/18 1017 05/28/18 0705 05/30/18 0530  AST 82* 74* 80*  ALT 30 26 28   ALKPHOS 487* 445*  572*  BILITOT 1.3* 1.3* 1.4*  PROT 4.8* 4.5* 4.7*  ALBUMIN 1.4* 1.3* 1.4*   No results for input(s): LIPASE, AMYLASE in the last 168 hours. Recent Labs  Lab 05/30/18 1155  AMMONIA 42*   CBC: Recent Labs  Lab 05/28/18 0705 05/30/18 0530  WBC 9.3 7.3  HGB 10.7* 10.4*  HCT 31.8* 31.7*  MCV 76.8* 76.9*  PLT 163 244   Cardiac Enzymes:   No results for input(s): CKTOTAL, CKMB, CKMBINDEX, TROPONINI in the last 168 hours. BNP (last 3 results) Recent Labs    05/24/18 0852  BNP 95.3    ProBNP (last 3 results) No results for input(s): PROBNP in the last 8760 hours.  CBG: Recent Labs  Lab 06/01/18 1702 06/01/18 2039 06/02/18 0001 06/02/18 0456 06/02/18 0830  GLUCAP 168* 133* 162* 156* 351*    Recent Results (from the past 240 hour(s))  Culture, blood (Routine x 2)     Status: None   Collection Time: 05/23/18  2:53 PM  Result Value Ref Range Status   Specimen Description BLOOD RIGHT ANTECUBITAL  Final   Special Requests   Final    BOTTLES DRAWN AEROBIC AND ANAEROBIC Blood Culture adequate volume   Culture   Final    NO GROWTH 5 DAYS Performed at [facility], [place] [address], [place], [place] [address]    Report Status 05/28/2018 FINAL  Final  Culture, blood (Routine x 2)     Status: None   Collection Time: 05/23/18  3:43 PM  Result Value Ref Range Status   Specimen Description BLOOD BLOOD LEFT FOREARM  Final   Special Requests   Final    BOTTLES DRAWN AEROBIC ONLY Blood Culture results may not be optimal due to an inadequate volume of blood received in culture bottles   Culture   Final    NO GROWTH 5 DAYS Performed at [facility], [place] [address] [place], [place] [address]    Report Status 05/28/2018 FINAL  Final  Gastrointestinal Panel by PCR , Stool     Status: None   Collection Time: 05/23/18 10:45 PM  Result Value Ref Range Status   Campylobacter species NOT DETECTED NOT DETECTED Final   Plesimonas shigelloides NOT DETECTED NOT DETECTED  Final   Salmonella species NOT DETECTED NOT DETECTED Final   Yersinia enterocolitica NOT DETECTED NOT DETECTED Final   Vibrio species NOT DETECTED NOT DETECTED Final   Vibrio cholerae NOT DETECTED NOT DETECTED Final   Enteroaggregative  E coli (EAEC) NOT DETECTED NOT DETECTED Final   Enteropathogenic E coli (EPEC) NOT DETECTED NOT DETECTED Final   Enterotoxigenic E coli (ETEC) NOT DETECTED NOT DETECTED Final   Shiga like toxin producing E coli (STEC) NOT DETECTED NOT DETECTED Final   Shigella/Enteroinvasive E coli (EIEC) NOT DETECTED NOT DETECTED Final   Cryptosporidium NOT DETECTED NOT DETECTED Final   Cyclospora cayetanensis NOT DETECTED NOT DETECTED Final   Entamoeba histolytica NOT DETECTED NOT DETECTED Final   Giardia lamblia NOT DETECTED NOT DETECTED Final   Adenovirus F40/41 NOT DETECTED NOT DETECTED Final   Astrovirus NOT DETECTED NOT DETECTED Final   Norovirus GI/GII NOT DETECTED NOT DETECTED Final   Rotavirus A NOT DETECTED NOT DETECTED Final   Sapovirus (I, II, IV, and V) NOT DETECTED NOT DETECTED Final    Comment: Performed at [facility], [place] [place], [place] [address]  C difficile quick scan w PCR reflex     Status: None   Collection Time: 05/23/18 11:00 PM  Result Value Ref Range Status   C Diff antigen NEGATIVE NEGATIVE Final   C Diff toxin NEGATIVE NEGATIVE Final   C Diff interpretation No C. difficile detected.  Final    Comment: Performed at [facility], [place] [address] [place], [place] [address]  Urine culture     Status: None   Collection Time: 05/24/18  1:37 AM  Result Value Ref Range Status   Specimen Description URINE, CLEAN CATCH  Final   Special Requests NONE  Final   Culture   Final    NO GROWTH Performed at [facility], [place] [address] [place], [place] [address]    Report Status 05/25/2018 FINAL  Final  Culture, blood (routine x 2)     Status: None   Collection Time: 05/27/18  2:25 PM  Result Value Ref Range Status    Specimen Description BLOOD LEFT ANTECUBITAL  Final   Special Requests   Final    BOTTLES DRAWN AEROBIC AND ANAEROBIC Blood Culture adequate volume   Culture   Final    NO GROWTH 5 DAYS Performed at [facility], [place] [address] [place], [place] [address]    Report Status 06/01/2018 FINAL  Final  Culture, blood (routine x 2)     Status: None   Collection Time: 05/27/18  2:39 PM  Result Value Ref Range Status   Specimen Description BLOOD LEFT HAND  Final   Special Requests   Final    BOTTLES DRAWN AEROBIC AND ANAEROBIC Blood Culture adequate volume   Culture   Final    NO GROWTH 5 DAYS Performed at [facility], [place] [address] [place], [place] [address]    Report Status 06/01/2018 FINAL  Final     Studies: No results found.  Scheduled Meds: . baclofen  5 mg Oral TID  . calcium carbonate  400 mg of elemental calcium Oral BID WC  . cholestyramine light  4 g Oral BID AC  . enoxaparin (LOVENOX) injection  40 mg Subcutaneous Q24H  . ethambutol  1,200 mg Oral Daily  . feeding supplement  1 Container Oral TID BM  . insulin aspart  0-9 Units Subcutaneous TID WC  . isoniazid  300 mg Oral Daily  . magic mouthwash  10 mL Oral TID  . magnesium oxide  400 mg Oral Daily  . multivitamin with minerals  1 tablet Oral Daily  . prochlorperazine  5 mg Oral TID AC  .  pyrazinamide  1,500 mg Oral Daily  . pyridOXINE  50 mg Oral Daily    Continuous Infusions: . sodium chloride 10 mL/hr at 05/25/18 1311  . sodium chloride 10 mL/hr at 05/31/18 1247  . feeding supplement (VITAL 1.5 CAL) 1,000 mL (06/02/18 0236)  . fluconazole (DIFLUCAN) IV 400 mg (06/01/18 1202)  . rifampin (RIFADIN) IVPB 600 mg (06/01/18 1003)     Time spent: 66mins, case discussed with infectious disease I have personally reviewed and interpreted on  06/02/2018 daily labs, tele strips, imagings as discussed above under date review session and assessment and plans.  I reviewed all nursing notes, pharmacy  notes, consultant notes,  vitals, pertinent old records  I have discussed plan of care as described above with RN , patient and wife on 06/02/2018   Florencia Reasons MD, PhD  [facility] Pager [phone]. If 7PM-7AM, please contact night-coverage at [email or web address], password [facility] 06/02/2018, 8:56 AM  LOS: 10 days

## 2018-06-02 NOTE — Progress Notes (Signed)
CBG was 315, Md paged orders received for sliding scale insulin, dietitian paged to change tube feedings.

## 2018-06-03 DIAGNOSIS — K591 Functional diarrhea: Secondary | ICD-10-CM

## 2018-06-03 DIAGNOSIS — Z515 Encounter for palliative care: Secondary | ICD-10-CM

## 2018-06-03 DIAGNOSIS — A199 Miliary tuberculosis, unspecified: Secondary | ICD-10-CM

## 2018-06-03 DIAGNOSIS — E871 Hypo-osmolality and hyponatremia: Secondary | ICD-10-CM

## 2018-06-03 DIAGNOSIS — E119 Type 2 diabetes mellitus without complications: Secondary | ICD-10-CM

## 2018-06-03 DIAGNOSIS — E162 Hypoglycemia, unspecified: Secondary | ICD-10-CM

## 2018-06-03 DIAGNOSIS — D869 Sarcoidosis, unspecified: Secondary | ICD-10-CM

## 2018-06-03 DIAGNOSIS — A419 Sepsis, unspecified organism: Principal | ICD-10-CM

## 2018-06-03 LAB — URINE CULTURE: Culture: <10000 — AB

## 2018-06-03 LAB — GLUCOSE, CAPILLARY
GLUCOSE-CAPILLARY: 112 mg/dL — AB (ref 70–99)
GLUCOSE-CAPILLARY: 152 mg/dL — AB (ref 70–99)
GLUCOSE-CAPILLARY: 79 mg/dL (ref 70–99)
GLUCOSE-CAPILLARY: 98 mg/dL (ref 70–99)
Glucose-Capillary: 89 mg/dL (ref 70–99)
Glucose-Capillary: 122 mg/dL — ABNORMAL HIGH (ref 70–99)

## 2018-06-03 LAB — LACTIC ACID, PLASMA: LACTIC ACID, VENOUS: 1.6 mmol/L (ref 0.5–1.9)

## 2018-06-03 MED ORDER — MAGNESIUM SULFATE 2 GM/50ML IV SOLN
2.0000 g | Freq: Once | INTRAVENOUS | Status: AC
  Administered 2018-06-03: 2 g via INTRAVENOUS
  Filled 2018-06-03: qty 50

## 2018-06-03 NOTE — Progress Notes (Signed)
PROGRESS NOTE  TIN ENGRAM ZOX:[id number] DOB: 12-07-1950 DOA: 05/23/2018 PCP: [name], MD  HPI/Recap of past 24 hours:  He is back on tele since yesterda pm's significant rigors/tachycardia  He appear weaker today, he states it is 05/2017 He c/o cortak is bothering his throat  Blood glucose is better after tube feeds changed to glucerna   Assessment/Plan: Principal Problem:   Sepsis ([place]) Active Problems:   Diabetes mellitus without complication ([place])   Sarcoidosis   Hypertension   Thrombocytopenia ([place])   Hyponatremia   Pulmonary tuberculosis   Diarrhea   Pressure injury of skin   Hypoglycemia   Dysphagia   TB (pulmonary tuberculosis)   Palliative care encounter  Sepsis ([place]) Secondary to active tuberculosis in the setting of sarcoidosis /immunosuppressed status and bilateral cavitary pneumonia. On RI PE antitubercular meds along with vitamin B6.  Switched rifampin to IV for better penetration ID in put appreciated  Spiked high fever with significant rigors on 6/28 pm, repeat ua unremarkable, repeat cxr no acute interval changes,  Repeat blood culture no growth ID following , appreciate input   Acute metabolic encephalopathy:  Improving , but remain slightly confused about the year  Type 2 diabetes mellitus A1c of 11.8.  CBG currently stable on sliding scale.  Amaryl and metformin discontinued during last hospitalization.  Microcytic anemia hgb stable at baseline around 10 will need to avoid iron supplement  For now in the setting of acute infection  Severe protein calorie malnutrition Pulled out PICC line on 6/25.  Will need core track placed in tomorrow and start tube feed.  High risk for refeeding syndrome.   palliative care input appreciated, he is DNR , continue cortrak tube feeds, readdress goal of care after a trial of 7-10days of tube feeds Check bmp/mag/phos in am, avoid refeeding syndrome  Hypomagnesemia: continue to replace mag, repeat  lab in am    Code Status: DNR  Family Communication: patient and wife at bedside on 6/28  Disposition Plan: not ready to discharge   Consultants:  ID  Palliative care  Procedures:  cortack placement and tube feeding  Antibiotics:  Per ID   Objective: BP 120/75 (BP Location: Right Arm)   Pulse (!) 105   Temp (!) 102.3 F (39.1 C) (Oral)   Resp 18   Ht 6\' 2"  (1.88 m)   Wt 65.6 kg (144 lb 10 oz)   SpO2 92%   BMI 18.57 kg/m   Intake/Output Summary (Last 24 hours) at 06/03/2018 1752 Last data filed at 06/03/2018 1615 Gross per 24 hour  Intake 2488.11 ml  Output 4752 ml  Net -2263.89 ml   Filed Weights   05/30/18 2041 06/01/18 0500 06/03/18 0500  Weight: 65 kg (143 lb 4.8 oz) 64.6 kg (142 lb 6.7 oz) 65.6 kg (144 lb 10 oz)    Exam: Patient is examined daily including today on 06/03/2018, exams remain the same as of yesterday except that has changed    General:  Very weak, malnourished   Cardiovascular: RRR  Respiratory: CTABL  Abdomen: Soft/ND/NT, positive BS  Musculoskeletal: No Edema  Neuro: alert, oriented to the month, place and person  Data Reviewed: Basic Metabolic Panel: Recent Labs  Lab 05/28/18 0705  05/30/18 0530 05/31/18 1435 05/31/18 1734 06/01/18 0644 06/01/18 2132 06/02/18 0915  NA 137  --  137  --   --   --   --  136  K 3.3*  --  3.3*  --   --   --   --  4.6  CL 112*  --  105  --   --   --   --  105  CO2 18*  --  22  --   --   --   --  22  GLUCOSE 136*  --  105*  --   --   --   --  185*  BUN 5*  --  <5*  --   --   --   --  <5*  CREATININE 0.76  --  0.80  --   --   --   --  0.59*  CALCIUM 6.8*  --  7.2*  --   --   --   --  7.5*  MG  --    < > 1.5* 1.5* 1.5* 1.5* 1.4* 1.5*  PHOS  --    < > 1.8* 2.9 2.9 2.7 2.8 2.8   < > = values in this interval not displayed.   Liver Function Tests: Recent Labs  Lab 05/28/18 0705 05/30/18 0530  AST 74* 80*  ALT 26 28  ALKPHOS 445* 572*  BILITOT 1.3* 1.4*  PROT 4.5* 4.7*  ALBUMIN  1.3* 1.4*   No results for input(s): LIPASE, AMYLASE in the last 168 hours. Recent Labs  Lab 05/30/18 1155  AMMONIA 42*   CBC: Recent Labs  Lab 05/28/18 0705 05/30/18 0530  WBC 9.3 7.3  HGB 10.7* 10.4*  HCT 31.8* 31.7*  MCV 76.8* 76.9*  PLT 163 244   Cardiac Enzymes:   No results for input(s): CKTOTAL, CKMB, CKMBINDEX, TROPONINI in the last 168 hours. BNP (last 3 results) Recent Labs    05/24/18 0852  BNP 95.3    ProBNP (last 3 results) No results for input(s): PROBNP in the last 8760 hours.  CBG: Recent Labs  Lab 06/03/18 0041 06/03/18 0618 06/03/18 0751 06/03/18 1134 06/03/18 1657  GLUCAP 152* 79 98 112* 89    Recent Results (from the past 240 hour(s))  Culture, blood (routine x 2)     Status: None   Collection Time: 05/27/18  2:25 PM  Result Value Ref Range Status   Specimen Description BLOOD LEFT ANTECUBITAL  Final   Special Requests   Final    BOTTLES DRAWN AEROBIC AND ANAEROBIC Blood Culture adequate volume   Culture   Final    NO GROWTH 5 DAYS Performed at [facility], [address] [place], [place] [address]    Report Status 06/01/2018 FINAL  Final  Culture, blood (routine x 2)     Status: None   Collection Time: 05/27/18  2:39 PM  Result Value Ref Range Status   Specimen Description BLOOD LEFT HAND  Final   Special Requests   Final    BOTTLES DRAWN AEROBIC AND ANAEROBIC Blood Culture adequate volume   Culture   Final    NO GROWTH 5 DAYS Performed at [facility], [place] [address], [place], [place] [address]    Report Status 06/01/2018 FINAL  Final  Culture, blood (routine x 2)     Status: None (Preliminary result)   Collection Time: 06/02/18  3:50 PM  Result Value Ref Range Status   Specimen Description BLOOD SITE NOT SPECIFIED  Final   Special Requests   Final    BOTTLES DRAWN AEROBIC ONLY Blood Culture results may not be optimal due to an inadequate volume of blood received in culture bottles   Culture   Final    NO  GROWTH < 24 HOURS Performed  at [facility], [place] [address] [place], [place] [address]    Report Status PENDING  Incomplete  Culture, blood (routine x 2)     Status: None (Preliminary result)   Collection Time: 06/02/18  4:10 PM  Result Value Ref Range Status   Specimen Description BLOOD LEFT HAND  Final   Special Requests   Final    BOTTLES DRAWN AEROBIC AND ANAEROBIC Blood Culture adequate volume   Culture   Final    NO GROWTH < 24 HOURS Performed at [facility], [place] [address] [place], [place] [address]    Report Status PENDING  Incomplete  Culture, Urine     Status: Abnormal   Collection Time: 06/02/18  4:59 PM  Result Value Ref Range Status   Specimen Description URINE, RANDOM  Final   Special Requests NONE  Final   Culture (A)  Final    <10,000 COLONIES/mL INSIGNIFICANT GROWTH Performed at [facility], [place] [address], [place], [place] [address]    Report Status 06/03/2018 FINAL  Final     Studies: No results found.  Scheduled Meds: . baclofen  5 mg Oral TID  . calcium carbonate  400 mg of elemental calcium Oral BID WC  . cholestyramine light  4 g Oral BID AC  . enoxaparin (LOVENOX) injection  40 mg Subcutaneous Q24H  . ethambutol  1,200 mg Oral Daily  . feeding supplement  1 Container Oral TID BM  . insulin aspart  0-9 Units Subcutaneous TID WC  . isoniazid  300 mg Oral Daily  . magic mouthwash  10 mL Oral TID  . magnesium oxide  400 mg Oral Daily  . metoprolol tartrate  2.5 mg Intravenous Q6H  . multivitamin with minerals  1 tablet Oral Daily  . prochlorperazine  5 mg Oral TID AC  . pyrazinamide  1,500 mg Oral Daily  . pyridOXINE  50 mg Oral Daily    Continuous Infusions: . sodium chloride 10 mL/hr at 05/25/18 1311  . sodium chloride Stopped (06/02/18 2300)  . sodium chloride 75 mL/hr at 06/03/18 0634  . feeding supplement (GLUCERNA 1.2 CAL) 1,000 mL (06/03/18 0649)  . fluconazole (DIFLUCAN) IV 400 mg (06/03/18 1355)  . rifampin  (RIFADIN) IVPB 600 mg (06/03/18 1122)     Time spent: 50mins I have personally reviewed and interpreted on  06/03/2018 daily labs, tele strips, imagings as discussed above under date review session and assessment and plans.  I reviewed all nursing notes, pharmacy notes, consultant notes,  vitals, pertinent old records  I have discussed plan of care as described above with RN , patient  on 06/03/2018   Florencia Reasons MD, PhD  [facility] Pager [phone]. If 7PM-7AM, please contact night-coverage at [email or web address], password [email or web address] 06/03/2018, 5:52 PM  LOS: 11 days

## 2018-06-03 NOTE — Progress Notes (Addendum)
Subjective: Somnolent after pain medications were given   Antibiotics:  Anti-infectives (From admission, onward)   Start     Dose/Rate Route Frequency Ordered Stop   06/01/18 1000  rifampin (RIFADIN) 600 mg in sodium chloride 0.9 % 100 mL IVPB     600 mg 200 mL/hr over 30 Minutes Intravenous Daily 05/31/18 1053     05/31/18 1100  fluconazole (DIFLUCAN) IVPB 400 mg     400 mg 100 mL/hr over 120 Minutes Intravenous Every 24 hours 05/31/18 1059     05/24/18 1000  ethambutol (MYAMBUTOL) tablet 1,200 mg     1,200 mg Oral Daily 05/23/18 2104     05/24/18 1000  isoniazid (NYDRAZID) tablet 300 mg     300 mg Oral Daily 05/23/18 2104     05/24/18 1000  pyrazinamide tablet 1,500 mg     1,500 mg Oral Daily 05/23/18 2104     05/24/18 1000  rifampin (RIFADIN) capsule 600 mg  Status:  Discontinued     600 mg Oral Daily 05/23/18 2104 05/31/18 1053   05/23/18 2330  piperacillin-tazobactam (ZOSYN) IVPB 3.375 g  Status:  Discontinued     3.375 g 12.5 mL/hr over 240 Minutes Intravenous Every 8 hours 05/23/18 1529 05/25/18 1237   05/23/18 2330  vancomycin (VANCOCIN) IVPB 750 mg/150 ml premix  Status:  Discontinued     750 mg 150 mL/hr over 60 Minutes Intravenous Every 8 hours 05/23/18 1530 05/23/18 2104   05/23/18 1530  piperacillin-tazobactam (ZOSYN) IVPB 3.375 g     3.375 g 100 mL/hr over 30 Minutes Intravenous  Once 05/23/18 1515 05/23/18 1625   05/23/18 1530  vancomycin (VANCOCIN) IVPB 1000 mg/200 mL premix  Status:  Discontinued     1,000 mg 200 mL/hr over 60 Minutes Intravenous  Once 05/23/18 1515 05/23/18 1528   05/23/18 1530  vancomycin (VANCOCIN) 1,500 mg in sodium chloride 0.9 % 500 mL IVPB     1,500 mg 250 mL/hr over 120 Minutes Intravenous  Once 05/23/18 1528 05/23/18 1807      Medications: Scheduled Meds: . baclofen  5 mg Oral TID  . calcium carbonate  400 mg of elemental calcium Oral BID WC  . cholestyramine light  4 g Oral BID AC  . enoxaparin (LOVENOX) injection   40 mg Subcutaneous Q24H  . ethambutol  1,200 mg Oral Daily  . feeding supplement  1 Container Oral TID BM  . insulin aspart  0-9 Units Subcutaneous TID WC  . isoniazid  300 mg Oral Daily  . magic mouthwash  10 mL Oral TID  . magnesium oxide  400 mg Oral Daily  . metoprolol tartrate  2.5 mg Intravenous Q6H  . multivitamin with minerals  1 tablet Oral Daily  . prochlorperazine  5 mg Oral TID AC  . pyrazinamide  1,500 mg Oral Daily  . pyridOXINE  50 mg Oral Daily   Continuous Infusions: . sodium chloride 10 mL/hr at 05/25/18 1311  . sodium chloride Stopped (06/02/18 2300)  . sodium chloride 75 mL/hr at 06/03/18 0634  . feeding supplement (GLUCERNA 1.2 CAL) 1,000 mL (06/03/18 0649)  . fluconazole (DIFLUCAN) IV 400 mg (06/03/18 1355)  . rifampin (RIFADIN) IVPB 600 mg (06/03/18 1122)   PRN Meds:.acetaminophen **OR** acetaminophen, diphenoxylate-atropine, gi cocktail, HYDROmorphone (DILAUDID) injection, loperamide, promethazine, sodium chloride flush    Objective: Weight change:   Intake/Output Summary (Last 24 hours) at 06/03/2018 1416 Last data filed at 06/03/2018 0900 Gross per 24 hour  Intake 2368.11 ml  Output 4552 ml  Net -2183.89 ml   Blood pressure 96/68, pulse (!) 101, temperature 97.8 F (36.6 C), temperature source Oral, resp. rate 18, height 6\' 2"  (1.88 m), weight 144 lb 10 oz (65.6 kg), SpO2 100 %. Temp:  [97.8 F (36.6 C)-103.1 F (39.5 C)] 97.8 F (36.6 C) (06/29 1019) Pulse Rate:  [101-162] 101 (06/29 1019) Resp:  [16-44] 18 (06/29 1019) BP: (96-215)/(60-117) 96/68 (06/29 1019) SpO2:  [94 %-100 %] 100 % (06/29 1019) Weight:  [144 lb 10 oz (65.6 kg)] 144 lb 10 oz (65.6 kg) (06/29 0500)  Physical Exam: General: Somnolent and underweight HEENT:  EOMI, ET tube in place CVS tachycardic rate, normal  Chest: , no wheezing, no respiratory distress Abdomen: soft non-distended,  Extremities: no edema or deformity noted bilaterally Skin: no rashes Neuro:  nonfocal  CBC:    BMET Recent Labs    06/02/18 0915  NA 136  K 4.6  CL 105  CO2 22  GLUCOSE 185*  BUN <5*  CREATININE 0.59*  CALCIUM 7.5*     Liver Panel  No results for input(s): PROT, ALBUMIN, AST, ALT, ALKPHOS, BILITOT, BILIDIR, IBILI in the last 72 hours.     Sedimentation Rate No results for input(s): ESRSEDRATE in the last 72 hours. C-Reactive Protein No results for input(s): CRP in the last 72 hours.  Micro Results: Recent Results (from the past 720 hour(s))  Acid Fast Smear (AFB)     Status: None   Collection Time: 05/06/18  2:53 PM  Result Value Ref Range Status   AFB Specimen Processing Concentration  Final   Acid Fast Smear Positive  Final    Comment: RESULT CALLED TO, READ BACK BY AND VERIFIED WITH: E CASTRO RN 2029 05/08/18 A BROWNING (NOTE) 2+, 4-36 acid-fast bacilli per 10 fields at 400X magnification, fluorescent smear REPORTED/ FAXED TO ANGELA B. 06.03.19 AT 1830 -VH Performed At: [facility] [place], [place] [id number] Rush Farmer MD PN:[id number]    Source (AFB) SPUTUM  Final    Comment: Performed at [facility], [place] [address], [place], [place] [address]  Acid Fast Smear (AFB)     Status: None   Collection Time: 05/10/18  9:02 AM  Result Value Ref Range Status   AFB Specimen Processing Comment  Final    Comment: Tissue Grinding and Digestion/Decontamination   Acid Fast Smear Negative  Final    Comment: (NOTE) Performed At: [facility] [place], [place] [id number] Rush Farmer MD TI:[id number]    Source (AFB) BONE MARROW  Final    Comment: Performed at [facility], [place] [address], [place], [place] [address]  Culture, blood (Routine x 2)     Status: None   Collection Time: 05/23/18  2:53 PM  Result Value Ref Range Status   Specimen Description BLOOD RIGHT ANTECUBITAL  Final   Special Requests   Final    BOTTLES DRAWN AEROBIC AND ANAEROBIC Blood Culture adequate  volume   Culture   Final    NO GROWTH 5 DAYS Performed at [facility], [place] [address], [place], [place] [address]    Report Status 05/28/2018 FINAL  Final  Culture, blood (Routine x 2)     Status: None   Collection Time: 05/23/18  3:43 PM  Result Value Ref Range Status   Specimen Description BLOOD BLOOD LEFT FOREARM  Final   Special Requests   Final    BOTTLES  DRAWN AEROBIC ONLY Blood Culture results may not be optimal due to an inadequate volume of blood received in culture bottles   Culture   Final    NO GROWTH 5 DAYS Performed at [facility], [place] [address], [place], [place] [address]    Report Status 05/28/2018 FINAL  Final  Gastrointestinal Panel by PCR , Stool     Status: None   Collection Time: 05/23/18 10:45 PM  Result Value Ref Range Status   Campylobacter species NOT DETECTED NOT DETECTED Final   Plesimonas shigelloides NOT DETECTED NOT DETECTED Final   Salmonella species NOT DETECTED NOT DETECTED Final   Yersinia enterocolitica NOT DETECTED NOT DETECTED Final   Vibrio species NOT DETECTED NOT DETECTED Final   Vibrio cholerae NOT DETECTED NOT DETECTED Final   Enteroaggregative E coli (EAEC) NOT DETECTED NOT DETECTED Final   Enteropathogenic E coli (EPEC) NOT DETECTED NOT DETECTED Final   Enterotoxigenic E coli (ETEC) NOT DETECTED NOT DETECTED Final   Shiga like toxin producing E coli (STEC) NOT DETECTED NOT DETECTED Final   Shigella/Enteroinvasive E coli (EIEC) NOT DETECTED NOT DETECTED Final   Cryptosporidium NOT DETECTED NOT DETECTED Final   Cyclospora cayetanensis NOT DETECTED NOT DETECTED Final   Entamoeba histolytica NOT DETECTED NOT DETECTED Final   Giardia lamblia NOT DETECTED NOT DETECTED Final   Adenovirus F40/41 NOT DETECTED NOT DETECTED Final   Astrovirus NOT DETECTED NOT DETECTED Final   Norovirus GI/GII NOT DETECTED NOT DETECTED Final   Rotavirus A NOT DETECTED NOT DETECTED Final   Sapovirus (I, II, IV, and V) NOT DETECTED NOT DETECTED Final     Comment: Performed at [facility], [place] [place], [place] [address]  C difficile quick scan w PCR reflex     Status: None   Collection Time: 05/23/18 11:00 PM  Result Value Ref Range Status   C Diff antigen NEGATIVE NEGATIVE Final   C Diff toxin NEGATIVE NEGATIVE Final   C Diff interpretation No C. difficile detected.  Final    Comment: Performed at [facility], [place] [address] [place], [place] [address]  Urine culture     Status: None   Collection Time: 05/24/18  1:37 AM  Result Value Ref Range Status   Specimen Description URINE, CLEAN CATCH  Final   Special Requests NONE  Final   Culture   Final    NO GROWTH Performed at [facility], [place] [address], [place], [place] [address]    Report Status 05/25/2018 FINAL  Final  Culture, blood (routine x 2)     Status: None   Collection Time: 05/27/18  2:25 PM  Result Value Ref Range Status   Specimen Description BLOOD LEFT ANTECUBITAL  Final   Special Requests   Final    BOTTLES DRAWN AEROBIC AND ANAEROBIC Blood Culture adequate volume   Culture   Final    NO GROWTH 5 DAYS Performed at [facility], [place] [address] [place], [place] [address]    Report Status 06/01/2018 FINAL  Final  Culture, blood (routine x 2)     Status: None   Collection Time: 05/27/18  2:39 PM  Result Value Ref Range Status   Specimen Description BLOOD LEFT HAND  Final   Special Requests   Final    BOTTLES DRAWN AEROBIC AND ANAEROBIC Blood Culture adequate volume   Culture   Final    NO GROWTH 5 DAYS Performed at [facility], [address] [place], [place] [address]    Report Status 06/01/2018 FINAL  Final  Culture, blood (routine x 2)     Status: None (Preliminary result)   Collection Time: 06/02/18  3:50 PM  Result Value Ref Range Status   Specimen Description BLOOD SITE NOT SPECIFIED  Final   Special Requests   Final    BOTTLES DRAWN AEROBIC ONLY Blood Culture results may not be optimal due to an  inadequate volume of blood received in culture bottles   Culture   Final    NO GROWTH < 24 HOURS Performed at [place] [address], [place], [place] [address]    Report Status PENDING  Incomplete  Culture, blood (routine x 2)     Status: None (Preliminary result)   Collection Time: 06/02/18  4:10 PM  Result Value Ref Range Status   Specimen Description BLOOD LEFT HAND  Final   Special Requests   Final    BOTTLES DRAWN AEROBIC AND ANAEROBIC Blood Culture adequate volume   Culture   Final    NO GROWTH < 24 HOURS Performed at [facility], [place] [address] [place], [place] [address]    Report Status PENDING  Incomplete    Studies/Results: Dg Chest Port 1 View  Result Date: 06/02/2018 CLINICAL DATA:  Shortness of breath.  Sepsis. EXAM: PORTABLE CHEST 1 VIEW COMPARISON:  One-view chest x-ray 05/27/2018. FINDINGS: Bilateral upper lobe miliary pneumonia is not significantly changed. A small bore feeding tube courses off the inferior border of the film. There is no pneumothorax. No consolidation or cavitary lesion is present. IMPRESSION: Stable appearance of upper lobe predominant miliary pneumonia compatible with TB. Electronically Signed   By: San Morelle M.D.   On: 06/02/2018 16:04      Assessment/Plan:  INTERVAL HISTORY: Patient remains with very poor appetite   Principal Problem:   Sepsis ([place]) Active Problems:   Diabetes mellitus without complication ([place])   Sarcoidosis   Hypertension   Thrombocytopenia ([place])   Hyponatremia   Pulmonary tuberculosis   Diarrhea   Pressure injury of skin   Hypoglycemia   Dysphagia   TB (pulmonary tuberculosis)   Palliative care encounter    Jeffrey Hayes is a 67 y.o. 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  He also  had a fever  #1 Fever of unknown origin: Think we need to consider the fact that this patient could have an immune reconstitution inflammatory syndrome which is been known to happen with tuberculosis regardless of the patient's immune status.  This patient previously was on prednisone making risk for this phenomena even higher since it appears that his prednisone was stopped during his hospitalization in May.  He also could have some component of adrenal insufficiency and I do not see that cortisol has been checked recently.  I will add a serum cortisol to his labs for tomorrow morning.  I would consider putting him on some empiric prednisone in case this is in fact an immune reconstitution syndrome.  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 He will need a sputum checked for AFB to prove clearance but he appears profoundly weak at present.  3.  History of chronic prednisone in the context of sarcoidosis: Check serum cortisol in the morning  4.  Failure to  thrive: Agree with trying to get him nutrition via Dobbhoff tube and in hopes that he will improve his appetite.  5 goals of care: In talking to his wife she seems very skeptical that he will improve.  Certainly TB itself is curable but every case is different in some people simply get into a difficult situation such as he has.  His improvement is contingent on his ability to get stronger which depends on his ability to take in nutrition.\       LOS: 11 days   Alcide Evener 06/03/2018, 2:16 PM

## 2018-06-04 LAB — BASIC METABOLIC PANEL
ANION GAP: 5 (ref 5–15)
BUN: 6 mg/dL — AB (ref 8–23)
CO2: 25 mmol/L (ref 22–32)
Calcium: 7.6 mg/dL — ABNORMAL LOW (ref 8.9–10.3)
Chloride: 105 mmol/L (ref 98–111)
Creatinine, Ser: 0.72 mg/dL (ref 0.61–1.24)
GFR calc Af Amer: >60 mL/min (ref >60)
GLUCOSE: 127 mg/dL — AB (ref 70–99)
POTASSIUM: 3.8 mmol/L (ref 3.5–5.1)
Sodium: 135 mmol/L (ref 135–145)

## 2018-06-04 LAB — GLUCOSE, CAPILLARY
GLUCOSE-CAPILLARY: 150 mg/dL — AB (ref 70–99)
GLUCOSE-CAPILLARY: 222 mg/dL — AB (ref 70–99)
Glucose-Capillary: 184 mg/dL — ABNORMAL HIGH (ref 70–99)
Glucose-Capillary: 92 mg/dL (ref 70–99)
Glucose-Capillary: 97 mg/dL (ref 70–99)
Glucose-Capillary: 97 mg/dL (ref 70–99)

## 2018-06-04 LAB — CORTISOL-AM, BLOOD: CORTISOL - AM: 7.8 ug/dL (ref 6.7–22.6)

## 2018-06-04 LAB — CBC WITH DIFFERENTIAL/PLATELET
Abs Immature Granulocytes: 0 10*3/uL (ref 0.0–0.1)
Basophils Absolute: 0.1 10*3/uL (ref 0.0–0.1)
Basophils Relative: 1 %
EOS PCT: 2 %
Eosinophils Absolute: 0.1 10*3/uL (ref 0.0–0.7)
HEMATOCRIT: 30.1 % — AB (ref 39.0–52.0)
HEMOGLOBIN: 9.7 g/dL — AB (ref 13.0–17.0)
Immature Granulocytes: 0 %
LYMPHS ABS: 1 10*3/uL (ref 0.7–4.0)
LYMPHS PCT: 11 %
MCH: 25.6 pg — ABNORMAL LOW (ref 26.0–34.0)
MCHC: 32.2 g/dL (ref 30.0–36.0)
MCV: 79.4 fL (ref 78.0–100.0)
MONO ABS: 0.8 10*3/uL (ref 0.1–1.0)
MONOS PCT: 10 %
Neutro Abs: 6.5 10*3/uL (ref 1.7–7.7)
Neutrophils Relative %: 76 %
Platelets: 176 10*3/uL (ref 150–400)
RBC: 3.79 MIL/uL — AB (ref 4.22–5.81)
RDW: 17.7 % — ABNORMAL HIGH (ref 11.5–15.5)
WBC: 8.6 10*3/uL (ref 4.0–10.5)

## 2018-06-04 LAB — MAGNESIUM: Magnesium: 1.8 mg/dL (ref 1.7–2.4)

## 2018-06-04 MED ORDER — PIPERACILLIN-TAZOBACTAM 3.375 G IVPB
3.3750 g | Freq: Three times a day (TID) | INTRAVENOUS | Status: DC
  Administered 2018-06-04 – 2018-06-05 (×3): 3.375 g via INTRAVENOUS
  Filled 2018-06-04 (×4): qty 50

## 2018-06-04 MED ORDER — METHYLPREDNISOLONE SODIUM SUCC 40 MG IJ SOLR
20.0000 mg | Freq: Four times a day (QID) | INTRAMUSCULAR | Status: DC
  Administered 2018-06-04 – 2018-06-05 (×4): 20 mg via INTRAVENOUS
  Filled 2018-06-04 (×4): qty 1

## 2018-06-04 NOTE — Progress Notes (Signed)
PROGRESS NOTE  Jeffrey Hayes JHE:[id number] DOB: [birth year] DOA: 05/23/2018 PCP: [name], MD  HPI/Recap of past 24 hours:  Keep having intermittent fever, heart rate and blood pressure has been stable  he is tolerating tube feeds   Assessment/Plan: Principal Problem:   Sepsis ([place]) Active Problems:   Diabetes mellitus without complication ([place])   Sarcoidosis   Hypertension   Thrombocytopenia ([place])   Hyponatremia   Pulmonary tuberculosis   Diarrhea   Pressure injury of skin   Hypoglycemia   Dysphagia   TB (pulmonary tuberculosis)   Palliative care encounter  Sepsis ([place]) Secondary to active tuberculosis in the setting of sarcoidosis /immunosuppressed status and bilateral cavitary pneumonia. On RI PE antitubercular meds along with vitamin B6.  Switched rifampin to IV for better penetration ID in put appreciated   Fever: -started to spike intermittent high fever since 6/28 - repeat ua unremarkable, repeat cxr no acute interval changes, Repeat blood culture no growth -ID Dr Tommy Medal started patient on zosyn and steroids on 6/30 to cover possible bacteria pneumonia and possible immune reconstitution  ID following daily , appreciate input   Acute metabolic encephalopathy:  He was initially very confused and pulled out picc line and rectal tube Improving ,no agitation but remain slightly confused about the year  Type 2 diabetes mellitus A1c of 11.8.  CBG currently stable on sliding scale.  Amaryl and metformin discontinued during last hospitalization.  Microcytic anemia hgb stable at baseline around 10 will need to avoid iron supplement  For now in the setting of acute infection  Severe protein calorie malnutrition Pulled out PICC line on 6/25.  Will need core track placed in tomorrow and start tube feed.  High risk for refeeding syndrome.   palliative care input appreciated, he is DNR , continue cortrak tube feeds, readdress goal of care after a trial of  7-10days of tube feeds Check bmp/mag/phos in am, avoid refeeding syndrome  Hypomagnesemia: continue to replace mag, repeat lab in am    Code Status: DNR  Family Communication: patient and wife at bedside on 6/28  Disposition Plan: not ready to discharge   Consultants:  ID  Palliative care  Procedures:  cortack placement and tube feeding  Antibiotics:  Per ID   Objective: BP 104/67 (BP Location: Right Arm)   Pulse (!) 107   Temp 98.2 F (36.8 C) (Oral)   Resp 18   Ht 6\' 2"  (1.88 m)   Wt 66.2 kg (145 lb 15.1 oz)   SpO2 98%   BMI 18.74 kg/m   Intake/Output Summary (Last 24 hours) at 06/04/2018 1507 Last data filed at 06/04/2018 1400 Gross per 24 hour  Intake 1760 ml  Output 1425 ml  Net 335 ml   Filed Weights   06/01/18 0500 06/03/18 0500 06/04/18 0057  Weight: 64.6 kg (142 lb 6.7 oz) 65.6 kg (144 lb 10 oz) 66.2 kg (145 lb 15.1 oz)    Exam: Patient is examined daily including today on 06/04/2018, exams remain the same as of yesterday except that has changed    General:  Very weak, malnourished   Cardiovascular: RRR  Respiratory: CTABL  Abdomen: Soft/ND/NT, positive BS  Musculoskeletal: No Edema  Neuro: alert, oriented to the month, place and person  Data Reviewed: Basic Metabolic Panel: Recent Labs  Lab 05/30/18 0530 05/31/18 1435 05/31/18 1734 06/01/18 0644 06/01/18 2132 06/02/18 0915 06/04/18 0636  NA 137  --   --   --   --  136 135  K 3.3*  --   --   --   --  4.6 3.8  CL 105  --   --   --   --  105 105  CO2 22  --   --   --   --  22 25  GLUCOSE 105*  --   --   --   --  185* 127*  BUN <5*  --   --   --   --  <5* 6*  CREATININE 0.80  --   --   --   --  0.59* 0.72  CALCIUM 7.2*  --   --   --   --  7.5* 7.6*  MG 1.5* 1.5* 1.5* 1.5* 1.4* 1.5* 1.8  PHOS 1.8* 2.9 2.9 2.7 2.8 2.8  --    Liver Function Tests: Recent Labs  Lab 05/30/18 0530  AST 80*  ALT 28  ALKPHOS 572*  BILITOT 1.4*  PROT 4.7*  ALBUMIN 1.4*   No results for  input(s): LIPASE, AMYLASE in the last 168 hours. Recent Labs  Lab 05/30/18 1155  AMMONIA 42*   CBC: Recent Labs  Lab 05/30/18 0530 06/04/18 0636  WBC 7.3 8.6  NEUTROABS  --  6.5  HGB 10.4* 9.7*  HCT 31.7* 30.1*  MCV 76.9* 79.4  PLT 244 176   Cardiac Enzymes:   No results for input(s): CKTOTAL, CKMB, CKMBINDEX, TROPONINI in the last 168 hours. BNP (last 3 results) Recent Labs    05/24/18 0852  BNP 95.3    ProBNP (last 3 results) No results for input(s): PROBNP in the last 8760 hours.  CBG: Recent Labs  Lab 06/03/18 2014 06/04/18 0008 06/04/18 0422 06/04/18 0735 06/04/18 1142  GLUCAP 122* 97 97 92 150*    Recent Results (from the past 240 hour(s))  Culture, blood (routine x 2)     Status: None   Collection Time: 05/27/18  2:25 PM  Result Value Ref Range Status   Specimen Description BLOOD LEFT ANTECUBITAL  Final   Special Requests   Final    BOTTLES DRAWN AEROBIC AND ANAEROBIC Blood Culture adequate volume   Culture   Final    NO GROWTH 5 DAYS Performed at [facility], [address], [place], [place] [address]    Report Status 06/01/2018 FINAL  Final  Culture, blood (routine x 2)     Status: None   Collection Time: 05/27/18  2:39 PM  Result Value Ref Range Status   Specimen Description BLOOD LEFT HAND  Final   Special Requests   Final    BOTTLES DRAWN AEROBIC AND ANAEROBIC Blood Culture adequate volume   Culture   Final    NO GROWTH 5 DAYS Performed at [facility], [place] [address], [place], [place] [address]    Report Status 06/01/2018 FINAL  Final  Culture, blood (routine x 2)     Status: None (Preliminary result)   Collection Time: 06/02/18  3:50 PM  Result Value Ref Range Status   Specimen Description BLOOD SITE NOT SPECIFIED  Final   Special Requests   Final    BOTTLES DRAWN AEROBIC ONLY Blood Culture results may not be optimal due to an inadequate volume of blood received in culture bottles   Culture   Final    NO GROWTH 2  DAYS Performed at [facility], [place] [address], [place], [place] [address]    Report Status PENDING  Incomplete  Culture, blood (routine x 2)  Status: None (Preliminary result)   Collection Time: 06/02/18  4:10 PM  Result Value Ref Range Status   Specimen Description BLOOD LEFT HAND  Final   Special Requests   Final    BOTTLES DRAWN AEROBIC AND ANAEROBIC Blood Culture adequate volume   Culture   Final    NO GROWTH 2 DAYS Performed at [facility], [address], [place], [place] [address]    Report Status PENDING  Incomplete  Culture, Urine     Status: Abnormal   Collection Time: 06/02/18  4:59 PM  Result Value Ref Range Status   Specimen Description URINE, RANDOM  Final   Special Requests NONE  Final   Culture (A)  Final    <10,000 COLONIES/mL INSIGNIFICANT GROWTH Performed at [facility], [place] [address] [place], [place] [address]    Report Status 06/03/2018 FINAL  Final     Studies: No results found.  Scheduled Meds: . baclofen  5 mg Oral TID  . calcium carbonate  400 mg of elemental calcium Oral BID WC  . cholestyramine light  4 g Oral BID AC  . enoxaparin (LOVENOX) injection  40 mg Subcutaneous Q24H  . ethambutol  1,200 mg Oral Daily  . feeding supplement  1 Container Oral TID BM  . insulin aspart  0-9 Units Subcutaneous TID WC  . isoniazid  300 mg Oral Daily  . magic mouthwash  10 mL Oral TID  . magnesium oxide  400 mg Oral Daily  . methylPREDNISolone (SOLU-MEDROL) injection  20 mg Intravenous Q6H  . metoprolol tartrate  2.5 mg Intravenous Q6H  . multivitamin with minerals  1 tablet Oral Daily  . prochlorperazine  5 mg Oral TID AC  . pyrazinamide  1,500 mg Oral Daily  . pyridOXINE  50 mg Oral Daily    Continuous Infusions: . sodium chloride 10 mL/hr at 05/25/18 1311  . sodium chloride Stopped (06/02/18 2300)  . feeding supplement (GLUCERNA 1.2 CAL) 1,000 mL (06/04/18 1150)  . fluconazole (DIFLUCAN) IV 400 mg (06/04/18 1335)  .  piperacillin-tazobactam (ZOSYN)  IV 3.375 g (06/04/18 1501)  . rifampin (RIFADIN) IVPB 600 mg (06/04/18 1131)     Time spent: 39mins, case discussed with infectious disease Dr Tommy Medal I have personally reviewed and interpreted on  06/04/2018 daily labs, tele strips, imagings as discussed above under date review session and assessment and plans.  I reviewed all nursing notes, pharmacy notes, consultant notes,  vitals, pertinent old records  I have discussed plan of care as described above with RN , patient  on 06/04/2018   Florencia Reasons MD, PhD  [facility] Pager [phone]. If 7PM-7AM, please contact night-coverage at [email or web address], password [facility] 06/04/2018, 3:07 PM  LOS: 12 days

## 2018-06-04 NOTE — Progress Notes (Signed)
ANTIBIOTIC CONSULT NOTE - INITIAL  Pharmacy Consult for Zosyn, Fluconazole Indication: aspiration PNA, esophageal candidiasis  Allergies  Allergen Reactions  . Tape Other (See Comments)    Per the patient's family, his skin is VERY FRAGILE & TEARS EASILY. PLEASE use an alternative to tape.    Patient Measurements: Height: 6\' 2"  (188 cm) Weight: 145 lb 15.1 oz (66.2 kg) IBW/kg (Calculated) : 82.2 Adjusted Body Weight:    Vital Signs: Temp: 98.2 F (36.8 C) (06/30 1138) Temp Source: Oral (06/30 1138) BP: 104/67 (06/30 1001) Pulse Rate: 107 (06/30 1001) Intake/Output from previous day: 06/29 0701 - 06/30 0700 In: 1380 [P.O.:360; I.V.:120; NG/GT:900] Out: 1700 [Urine:1700] Intake/Output from this shift: Total I/O In: 240 [P.O.:240] Out: 200 [Urine:200]  Labs: Recent Labs    06/02/18 0915 06/04/18 0636  WBC  --  8.6  HGB  --  9.7*  PLT  --  176  CREATININE 0.59* 0.72   Estimated Creatinine Clearance: 85 mL/min (by C-G formula based on SCr of 0.72 mg/dL). No results for input(s): VANCOTROUGH, VANCOPEAK, VANCORANDOM, GENTTROUGH, GENTPEAK, GENTRANDOM, TOBRATROUGH, TOBRAPEAK, TOBRARND, AMIKACINPEAK, AMIKACINTROU, AMIKACIN in the last 72 hours.   Microbiology:   Medical History: Past Medical History:  Diagnosis Date  . Arthritis   . Diabetes mellitus without complication ([place])   . Hypertension   . Sarcoidosis     Assessment: ID: RIPE therapy + pyridoxine for TB diagnosed earlier this year. WBC 8.6 down, tmax/24h 2.3. Currently afebrile. Fluconazole for esophageal candidiasis. Zosyn for aspiration PNA  Zosyn 6/18>>6/20, 6/30>> Vanc 6/18 x1 Fluconazole 6/26>>  6/18 BCx: negative 6/18 Cdiff: neg antigen and toxin 6/22: BC x 2: negative 6/28: UCx: insignificant growth 6/28: BC x 2>>  Goal of Therapy:  Eradication of infection  Plan:  Zosyn 3.375g IV q 8 hrs Continue Fluconazole 200-400mg  po/IV daily for 14-21 days (guideline dosing)  Wyoma Genson S.  Alford Highland, PharmD, BCPS Clinical Staff Pharmacist Pager [phone]  Eilene Ghazi Stillinger 06/04/2018,12:58 PM

## 2018-06-04 NOTE — Progress Notes (Signed)
Subjective: Patient states he is hurting all over.   Antibiotics:  Anti-infectives (From admission, onward)   Start     Dose/Rate Route Frequency Ordered Stop   06/04/18 1400  piperacillin-tazobactam (ZOSYN) IVPB 3.375 g     3.375 g 12.5 mL/hr over 240 Minutes Intravenous Every 8 hours 06/04/18 1258     06/01/18 1000  rifampin (RIFADIN) 600 mg in sodium chloride 0.9 % 100 mL IVPB     600 mg 200 mL/hr over 30 Minutes Intravenous Daily 05/31/18 1053     05/31/18 1100  fluconazole (DIFLUCAN) IVPB 400 mg     400 mg 100 mL/hr over 120 Minutes Intravenous Every 24 hours 05/31/18 1059     05/24/18 1000  ethambutol (MYAMBUTOL) tablet 1,200 mg     1,200 mg Oral Daily 05/23/18 2104     05/24/18 1000  isoniazid (NYDRAZID) tablet 300 mg     300 mg Oral Daily 05/23/18 2104     05/24/18 1000  pyrazinamide tablet 1,500 mg     1,500 mg Oral Daily 05/23/18 2104     05/24/18 1000  rifampin (RIFADIN) capsule 600 mg  Status:  Discontinued     600 mg Oral Daily 05/23/18 2104 05/31/18 1053   05/23/18 2330  piperacillin-tazobactam (ZOSYN) IVPB 3.375 g  Status:  Discontinued     3.375 g 12.5 mL/hr over 240 Minutes Intravenous Every 8 hours 05/23/18 1529 05/25/18 1237   05/23/18 2330  vancomycin (VANCOCIN) IVPB 750 mg/150 ml premix  Status:  Discontinued     750 mg 150 mL/hr over 60 Minutes Intravenous Every 8 hours 05/23/18 1530 05/23/18 2104   05/23/18 1530  piperacillin-tazobactam (ZOSYN) IVPB 3.375 g     3.375 g 100 mL/hr over 30 Minutes Intravenous  Once 05/23/18 1515 05/23/18 1625   05/23/18 1530  vancomycin (VANCOCIN) IVPB 1000 mg/200 mL premix  Status:  Discontinued     1,000 mg 200 mL/hr over 60 Minutes Intravenous  Once 05/23/18 1515 05/23/18 1528   05/23/18 1530  vancomycin (VANCOCIN) 1,500 mg in sodium chloride 0.9 % 500 mL IVPB     1,500 mg 250 mL/hr over 120 Minutes Intravenous  Once 05/23/18 1528 05/23/18 1807      Medications: Scheduled Meds: . baclofen  5 mg Oral TID   . calcium carbonate  400 mg of elemental calcium Oral BID WC  . cholestyramine light  4 g Oral BID AC  . enoxaparin (LOVENOX) injection  40 mg Subcutaneous Q24H  . ethambutol  1,200 mg Oral Daily  . feeding supplement  1 Container Oral TID BM  . insulin aspart  0-9 Units Subcutaneous TID WC  . isoniazid  300 mg Oral Daily  . magic mouthwash  10 mL Oral TID  . magnesium oxide  400 mg Oral Daily  . methylPREDNISolone (SOLU-MEDROL) injection  20 mg Intravenous Q6H  . metoprolol tartrate  2.5 mg Intravenous Q6H  . multivitamin with minerals  1 tablet Oral Daily  . prochlorperazine  5 mg Oral TID AC  . pyrazinamide  1,500 mg Oral Daily  . pyridOXINE  50 mg Oral Daily   Continuous Infusions: . sodium chloride 10 mL/hr at 05/25/18 1311  . sodium chloride Stopped (06/02/18 2300)  . feeding supplement (GLUCERNA 1.2 CAL) 1,000 mL (06/04/18 1150)  . fluconazole (DIFLUCAN) IV 400 mg (06/04/18 1335)  . piperacillin-tazobactam (ZOSYN)  IV 3.375 g (06/04/18 1501)  . rifampin (RIFADIN) IVPB 600 mg (06/04/18 1131)   PRN  Meds:.acetaminophen **OR** acetaminophen, diphenoxylate-atropine, gi cocktail, HYDROmorphone (DILAUDID) injection, loperamide, promethazine, sodium chloride flush    Objective: Weight change: 1 lb 5.2 oz (0.6 kg)  Intake/Output Summary (Last 24 hours) at 06/04/2018 1518 Last data filed at 06/04/2018 1400 Gross per 24 hour  Intake 1760 ml  Output 1425 ml  Net 335 ml   Blood pressure 104/67, pulse (!) 107, temperature 98.2 F (36.8 C), temperature source Oral, resp. rate 18, height 6\' 2"  (1.88 m), weight 145 lb 15.1 oz (66.2 kg), SpO2 98 %. Temp:  [97.8 F (36.6 C)-102.3 F (39.1 C)] 98.2 F (36.8 C) (06/30 1138) Pulse Rate:  [95-110] 107 (06/30 1001) Resp:  [18-20] 18 (06/30 1001) BP: (96-135)/(65-76) 104/67 (06/30 1001) SpO2:  [92 %-98 %] 98 % (06/30 1001) Weight:  [145 lb 15.1 oz (66.2 kg)] 145 lb 15.1 oz (66.2 kg) (06/30 0057)  Physical Exam: General: Awake and  dysphoric saying at times he just wants to not live anymore. HEENT:  EOMI, NG tube in place his tongue is dry and coated with some thrush CVS tachycardic rate, normal  Chest: , no wheezing, no respiratory distress Abdomen: soft non-distended,  Extremities: He has some diffuse pain throughout his arms and legs but without clear-cut effusions skin: no rashes Neuro: nonfocal  CBC:    BMET Recent Labs    06/02/18 0915 06/04/18 0636  NA 136 135  K 4.6 3.8  CL 105 105  CO2 22 25  GLUCOSE 185* 127*  BUN <5* 6*  CREATININE 0.59* 0.72  CALCIUM 7.5* 7.6*     Liver Panel  No results for input(s): PROT, ALBUMIN, AST, ALT, ALKPHOS, BILITOT, BILIDIR, IBILI in the last 72 hours.     Sedimentation Rate No results for input(s): ESRSEDRATE in the last 72 hours. C-Reactive Protein No results for input(s): CRP in the last 72 hours.  Micro Results: Recent Results (from the past 720 hour(s))  Acid Fast Smear (AFB)     Status: None   Collection Time: 05/06/18  2:53 PM  Result Value Ref Range Status   AFB Specimen Processing Concentration  Final   Acid Fast Smear Positive  Final    Comment: RESULT CALLED TO, READ BACK BY AND VERIFIED WITH: E CASTRO RN 2029 05/08/18 A BROWNING (NOTE) 2+, 4-36 acid-fast bacilli per 10 fields at 400X magnification, fluorescent smear REPORTED/ FAXED TO ANGELA B. 06.03.19 AT 1830 -VH Performed At: [facility] [place], [place] [id number] Rush Farmer MD [id number]    Source (AFB) SPUTUM  Final    Comment: Performed at [facility], [place] [address] [place], [place] [address]  Acid Fast Smear (AFB)     Status: None   Collection Time: 05/10/18  9:02 AM  Result Value Ref Range Status   AFB Specimen Processing Comment  Final    Comment: Tissue Grinding and Digestion/Decontamination   Acid Fast Smear Negative  Final    Comment: (NOTE) Performed At: [facility] [place], [place]  [id number] Rush Farmer MD UX:[id number]    Source (AFB) BONE MARROW  Final    Comment: Performed at [facility], [place] [address] [place],  [address]  Culture, blood (Routine x 2)     Status: None   Collection Time: 05/23/18  2:53 PM  Result Value Ref Range Status   Specimen Description BLOOD RIGHT ANTECUBITAL  Final   Special Requests   Final    BOTTLES DRAWN AEROBIC AND ANAEROBIC Blood Culture adequate  volume   Culture   Final    NO GROWTH 5 DAYS Performed at [facility], [place] [address], [place], [place] [address]    Report Status 05/28/2018 FINAL  Final  Culture, blood (Routine x 2)     Status: None   Collection Time: 05/23/18  3:43 PM  Result Value Ref Range Status   Specimen Description BLOOD BLOOD LEFT FOREARM  Final   Special Requests   Final    BOTTLES DRAWN AEROBIC ONLY Blood Culture results may not be optimal due to an inadequate volume of blood received in culture bottles   Culture   Final    NO GROWTH 5 DAYS Performed at [facility], [place] [address] [place], [place] [address]    Report Status 05/28/2018 FINAL  Final  Gastrointestinal Panel by PCR , Stool     Status: None   Collection Time: 05/23/18 10:45 PM  Result Value Ref Range Status   Campylobacter species NOT DETECTED NOT DETECTED Final   Plesimonas shigelloides NOT DETECTED NOT DETECTED Final   Salmonella species NOT DETECTED NOT DETECTED Final   Yersinia enterocolitica NOT DETECTED NOT DETECTED Final   Vibrio species NOT DETECTED NOT DETECTED Final   Vibrio cholerae NOT DETECTED NOT DETECTED Final   Enteroaggregative E coli (EAEC) NOT DETECTED NOT DETECTED Final   Enteropathogenic E coli (EPEC) NOT DETECTED NOT DETECTED Final   Enterotoxigenic E coli (ETEC) NOT DETECTED NOT DETECTED Final   Shiga like toxin producing E coli (STEC) NOT DETECTED NOT DETECTED Final   Shigella/Enteroinvasive E coli (EIEC) NOT DETECTED NOT DETECTED Final   Cryptosporidium NOT DETECTED NOT DETECTED  Final   Cyclospora cayetanensis NOT DETECTED NOT DETECTED Final   Entamoeba histolytica NOT DETECTED NOT DETECTED Final   Giardia lamblia NOT DETECTED NOT DETECTED Final   Adenovirus F40/41 NOT DETECTED NOT DETECTED Final   Astrovirus NOT DETECTED NOT DETECTED Final   Norovirus GI/GII NOT DETECTED NOT DETECTED Final   Rotavirus A NOT DETECTED NOT DETECTED Final   Sapovirus (I, II, IV, and V) NOT DETECTED NOT DETECTED Final    Comment: Performed at [facility], [place] [place], [place] [address]  C difficile quick scan w PCR reflex     Status: None   Collection Time: 05/23/18 11:00 PM  Result Value Ref Range Status   C Diff antigen NEGATIVE NEGATIVE Final   C Diff toxin NEGATIVE NEGATIVE Final   C Diff interpretation No C. difficile detected.  Final    Comment: Performed at [facility], [place] [address] [place], [place] [address]  Urine culture     Status: None   Collection Time: 05/24/18  1:37 AM  Result Value Ref Range Status   Specimen Description URINE, CLEAN CATCH  Final   Special Requests NONE  Final   Culture   Final    NO GROWTH Performed at [facility], [place] [address] [place], [place] [address]    Report Status 05/25/2018 FINAL  Final  Culture, blood (routine x 2)     Status: None   Collection Time: 05/27/18  2:25 PM  Result Value Ref Range Status   Specimen Description BLOOD LEFT ANTECUBITAL  Final   Special Requests   Final    BOTTLES DRAWN AEROBIC AND ANAEROBIC Blood Culture adequate volume   Culture   Final    NO GROWTH 5 DAYS Performed at [facility], [place] [address], [place], [place] [address]  Report Status 06/01/2018 FINAL  Final  Culture, blood (routine x 2)     Status: None   Collection Time: 05/27/18  2:39 PM  Result Value Ref Range Status   Specimen Description BLOOD LEFT HAND  Final   Special Requests   Final    BOTTLES DRAWN AEROBIC AND ANAEROBIC Blood Culture adequate volume   Culture   Final    NO GROWTH 5  DAYS Performed at [facility], [address], [place], [place] [address]    Report Status 06/01/2018 FINAL  Final  Culture, blood (routine x 2)     Status: None (Preliminary result)   Collection Time: 06/02/18  3:50 PM  Result Value Ref Range Status   Specimen Description BLOOD SITE NOT SPECIFIED  Final   Special Requests   Final    BOTTLES DRAWN AEROBIC ONLY Blood Culture results may not be optimal due to an inadequate volume of blood received in culture bottles   Culture   Final    NO GROWTH 2 DAYS Performed at [facility], [place] [address] [place], [place] [address]    Report Status PENDING  Incomplete  Culture, blood (routine x 2)     Status: None (Preliminary result)   Collection Time: 06/02/18  4:10 PM  Result Value Ref Range Status   Specimen Description BLOOD LEFT HAND  Final   Special Requests   Final    BOTTLES DRAWN AEROBIC AND ANAEROBIC Blood Culture adequate volume   Culture   Final    NO GROWTH 2 DAYS Performed at [facility], [place] [address], [place], [place] [address]    Report Status PENDING  Incomplete  Culture, Urine     Status: Abnormal   Collection Time: 06/02/18  4:59 PM  Result Value Ref Range Status   Specimen Description URINE, RANDOM  Final   Special Requests NONE  Final   Culture (A)  Final    <10,000 COLONIES/mL INSIGNIFICANT GROWTH Performed at [facility], [place] [address] [place], [place] [address]    Report Status 06/03/2018 FINAL  Final    Studies/Results: Dg Chest Port 1 View  Result Date: 06/02/2018 CLINICAL DATA:  Shortness of breath.  Sepsis. EXAM: PORTABLE CHEST 1 VIEW COMPARISON:  One-view chest x-ray 05/27/2018. FINDINGS: Bilateral upper lobe miliary pneumonia is not significantly changed. A small bore feeding tube courses off the inferior border of the film. There is no pneumothorax. No consolidation or cavitary lesion is present. IMPRESSION: Stable appearance of upper lobe predominant miliary pneumonia  compatible with TB. Electronically Signed   By: San Morelle M.D.   On: 06/02/2018 16:04      Assessment/Plan:  INTERVAL HISTORY: She had had Reiger's and chills overnight   Principal Problem:   Sepsis ([place]) Active Problems:   Diabetes mellitus without complication ([place])   Sarcoidosis   Hypertension   Thrombocytopenia ([facility])   Hyponatremia   Pulmonary tuberculosis   Diarrhea   Pressure injury of skin   Hypoglycemia   Dysphagia   TB (pulmonary tuberculosis)   Palliative care encounter    Jeffrey Hayes is a 67 y.o. 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  He also had a fever, thrush and dysphagia as well as joint pains  #1 Fever of unknown origin: Think we need to consider the fact that this patient could have an immune reconstitution inflammatory syndrome which is been known to happen with tuberculosis  I do think we could worry about superimposed bacterial infection as well.  I am going to place him on Zosyn to cover for possible superimposed posed bacterial infection in the lungs  Were to cover his thrush more aggressively with systemic fluconazole  I am going to start corticosteroids for possible immune reconstitution syndrome.  He also could have some component of adrenal insufficiency and I do not see that cortisol has been checked recently.   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 He will need a sputum checked for AFB to prove clearance but he appears profoundly weak at present.  3: Thrush: start azole   4   Failure to thrive: Agree with trying to get him nutrition via Dobbhoff tube and in hopes that  he will improve his appetite.  5  goals of care: In talking to his wife she seems very skeptical that he will improve.  Certainly TB itself is curable but every case is different in some people simply get into a difficult situation such as he has.  His improvement is contingent on his ability to get stronger which depends on his ability to take in nutrition.  Dr. Megan Salon take over the service tomorrow.       LOS: 12 days   Alcide Evener 06/04/2018, 3:18 PM

## 2018-06-05 DIAGNOSIS — R509 Fever, unspecified: Secondary | ICD-10-CM

## 2018-06-05 DIAGNOSIS — Z931 Gastrostomy status: Secondary | ICD-10-CM

## 2018-06-05 DIAGNOSIS — G8929 Other chronic pain: Secondary | ICD-10-CM

## 2018-06-05 DIAGNOSIS — A199 Miliary tuberculosis, unspecified: Secondary | ICD-10-CM

## 2018-06-05 LAB — GLUCOSE, CAPILLARY
Glucose-Capillary: 203 mg/dL — ABNORMAL HIGH (ref 70–99)
Glucose-Capillary: 290 mg/dL — ABNORMAL HIGH (ref 70–99)
Glucose-Capillary: 350 mg/dL — ABNORMAL HIGH (ref 70–99)

## 2018-06-05 LAB — HEPATIC FUNCTION PANEL
ALT: 29 U/L (ref 0–44)
AST: 69 U/L — ABNORMAL HIGH (ref 15–41)
Albumin: 1.4 g/dL — ABNORMAL LOW (ref 3.5–5.0)
Alkaline Phosphatase: 423 U/L — ABNORMAL HIGH (ref 38–126)
BILIRUBIN INDIRECT: 0.4 mg/dL (ref 0.3–0.9)
Bilirubin, Direct: 0.4 mg/dL — ABNORMAL HIGH (ref 0.0–0.2)
TOTAL PROTEIN: 5.1 g/dL — AB (ref 6.5–8.1)
Total Bilirubin: 0.8 mg/dL (ref 0.3–1.2)

## 2018-06-05 LAB — BASIC METABOLIC PANEL
Anion gap: 9 (ref 5–15)
BUN: 8 mg/dL (ref 8–23)
CALCIUM: 8.1 mg/dL — AB (ref 8.9–10.3)
CO2: 26 mmol/L (ref 22–32)
CREATININE: 0.65 mg/dL (ref 0.61–1.24)
Chloride: 99 mmol/L (ref 98–111)
GFR calc Af Amer: >60 mL/min (ref >60)
GLUCOSE: 293 mg/dL — AB (ref 70–99)
POTASSIUM: 4.2 mmol/L (ref 3.5–5.1)
SODIUM: 134 mmol/L — AB (ref 135–145)

## 2018-06-05 MED ORDER — HALOPERIDOL 0.5 MG PO TABS
0.5000 mg | ORAL_TABLET | ORAL | Status: DC | PRN
  Filled 2018-06-05: qty 1

## 2018-06-05 MED ORDER — BIOTENE DRY MOUTH MT LIQD
15.0000 mL | OROMUCOSAL | Status: DC | PRN
Start: 2018-06-05 — End: 2018-06-05

## 2018-06-05 MED ORDER — HYDROMORPHONE HCL 1 MG/ML IJ SOLN: 1.0000 mg | INTRAMUSCULAR | Status: DC | PRN

## 2018-06-05 MED ORDER — SODIUM CHLORIDE 0.9 % IV SOLN: 250.0000 mL | INTRAVENOUS | Status: DC | PRN

## 2018-06-05 MED ORDER — POLYVINYL ALCOHOL 1.4 % OP SOLN
1.0000 [drp] | Freq: Four times a day (QID) | OPHTHALMIC | Status: DC | PRN
  Filled 2018-06-05: qty 15

## 2018-06-05 MED ORDER — HALOPERIDOL LACTATE 5 MG/ML IJ SOLN: 0.5000 mg | INTRAMUSCULAR | Status: DC | PRN

## 2018-06-05 MED ORDER — GLYCOPYRROLATE 0.2 MG/ML IJ SOLN
0.2000 mg | INTRAMUSCULAR | Status: DC | PRN
  Filled 2018-06-05: qty 1

## 2018-06-05 MED ORDER — LORAZEPAM 2 MG/ML IJ SOLN
0.5000 mg | Freq: Two times a day (BID) | INTRAMUSCULAR | Status: DC
  Administered 2018-06-05: 0.5 mg via INTRAVENOUS
  Filled 2018-06-05: qty 1

## 2018-06-05 MED ORDER — ONDANSETRON 4 MG PO TBDP: 4.0000 mg | ORAL_TABLET | Freq: Four times a day (QID) | ORAL | Status: DC | PRN

## 2018-06-05 MED ORDER — ONDANSETRON HCL 4 MG/2ML IJ SOLN
4.0000 mg | Freq: Four times a day (QID) | INTRAMUSCULAR | Status: DC | PRN
Start: 2018-06-05 — End: 2018-06-05

## 2018-06-05 MED ORDER — GLYCOPYRROLATE 1 MG PO TABS
1.0000 mg | ORAL_TABLET | ORAL | Status: DC | PRN
  Filled 2018-06-05: qty 1

## 2018-06-05 MED ORDER — SODIUM CHLORIDE 0.9% FLUSH: 3.0000 mL | Freq: Two times a day (BID) | INTRAVENOUS | Status: DC

## 2018-06-05 MED ORDER — HYDROMORPHONE HCL 1 MG/ML IJ SOLN
0.5000 mg | Freq: Three times a day (TID) | INTRAMUSCULAR | Status: DC
  Administered 2018-06-05: 0.5 mg via INTRAVENOUS
  Filled 2018-06-05: qty 1

## 2018-06-05 MED ORDER — SODIUM CHLORIDE 0.9% FLUSH: 3.0000 mL | INTRAVENOUS | Status: DC | PRN

## 2018-06-05 MED ORDER — HALOPERIDOL LACTATE 2 MG/ML PO CONC
0.5000 mg | ORAL | Status: DC | PRN
  Filled 2018-06-05: qty 0.3

## 2018-06-05 MED ORDER — INSULIN ASPART 100 UNIT/ML ~~LOC~~ SOLN
0.0000 [IU] | SUBCUTANEOUS | Status: DC
  Administered 2018-06-05: 3 [IU] via SUBCUTANEOUS
  Administered 2018-06-05: 7 [IU] via SUBCUTANEOUS
  Administered 2018-06-05: 5 [IU] via SUBCUTANEOUS

## 2018-06-05 NOTE — Progress Notes (Signed)
Nutrition Brief Note  Chart reviewed. Pt now transitioning to comfort care. No further nutrition interventions warranted at this time.  Please re-consult as needed.    Diego Ulbricht MS, RD, LDN, CNSC [phone] Pager  [phone] Weekend/On-Call Pager     

## 2018-06-05 NOTE — Progress Notes (Signed)
[facility] for Infectious Disease  Date of Admission:  05/23/2018             ASSESSMENT/PLAN  Mr. Vercher has remained afebrile for about 24 hours now and is currently receiving RIPE for Tuberculosis and started on Zosyn (Day 2) for empiric coverage of potential superimposed infection. He appears to be stable with the current regimen and is tolerating his tube feedings. He is on Day 6 of fluconazole for thrush. He does have symptoms consistent with IRIS including fever and pain. Patient has met with Palliative/Hospice and decided that he has wanted to pursue comfort care measures only and would like to stop all therapies at this time.   1. We will contact the Health Department regarding new active case of TB and he will not be on medication.  ID will sign off and will be available as needed.    Principal Problem:   Sepsis ([place]) Active Problems:   Diabetes mellitus without complication ([place])   Sarcoidosis   Hypertension   Thrombocytopenia ([place])   Hyponatremia   Pulmonary tuberculosis   Diarrhea   Pressure injury of skin   Hypoglycemia   Dysphagia   TB (pulmonary tuberculosis)   Palliative care encounter   . baclofen  5 mg Oral TID  . calcium carbonate  400 mg of elemental calcium Oral BID WC  . feeding supplement  1 Container Oral TID BM  .  HYDROmorphone (DILAUDID) injection  0.5 mg Intravenous Q8H  . LORazepam  0.5 mg Intravenous BID  . magic mouthwash  10 mL Oral TID  . prochlorperazine  5 mg Oral TID AC  . sodium chloride flush  3 mL Intravenous Q12H    SUBJECTIVE:  Afebrile over the last 18 hours. Continues to have generalized pain requiring medication. Patient is now requesting comfort care measures.   Allergies  Allergen Reactions  . Tape Other (See Comments)    Per the patient's family, his skin is VERY FRAGILE & TEARS EASILY. PLEASE use an alternative to tape.     Review of Systems: Review of Systems  Constitutional: Positive for  malaise/fatigue. Negative for chills and fever.  Respiratory: Negative for cough, sputum production, shortness of breath and wheezing.   Gastrointestinal: Positive for nausea. Negative for abdominal pain, constipation and diarrhea.      OBJECTIVE: Vitals:   06/04/18 2020 06/05/18 0002 06/05/18 0411 06/05/18 1007  BP: 109/74 122/80 122/78 136/81  Pulse: 86 82 74 84  Resp: 15  16 (!) 22  Temp: 98.1 F (36.7 C)  98.9 F (37.2 C) 98.1 F (36.7 C)  TempSrc: Oral   Oral  SpO2: 97%  98% 99%  Weight: 145 lb 15.2 oz (66.2 kg)     Height:       Body mass index is 18.74 kg/m.  Physical Exam  Constitutional: He is oriented to person, place, and time. He appears cachectic. He is cooperative. He has a sickly appearance. He appears ill. No distress.  HENT:  Feeding tube in place and patent.   Cardiovascular: Normal rate, regular rhythm, normal heart sounds and intact distal pulses.  Pulmonary/Chest: Effort normal and breath sounds normal.  Neurological: He is alert and oriented to person, place, and time.  Skin: Skin is warm and dry.  Psychiatric: He has a normal mood and affect. His behavior is normal. Judgment and thought content normal.    Lab Results Lab Results  Component Value Date   WBC 8.6 06/04/2018   HGB 9.7 (  L) 06/04/2018   HCT 30.1 (L) 06/04/2018   MCV 79.4 06/04/2018   PLT 176 06/04/2018    Lab Results  Component Value Date   CREATININE 0.65 06/05/2018   BUN 8 06/05/2018   NA 134 (L) 06/05/2018   K 4.2 06/05/2018   CL 99 06/05/2018   CO2 26 06/05/2018    Lab Results  Component Value Date   ALT 29 06/05/2018   AST 69 (H) 06/05/2018   ALKPHOS 423 (H) 06/05/2018   BILITOT 0.8 06/05/2018     Microbiology: Recent Results (from the past 240 hour(s))  Culture, blood (routine x 2)     Status: None   Collection Time: 05/27/18  2:25 PM  Result Value Ref Range Status   Specimen Description BLOOD LEFT ANTECUBITAL  Final   Special Requests   Final    BOTTLES  DRAWN AEROBIC AND ANAEROBIC Blood Culture adequate volume   Culture   Final    NO GROWTH 5 DAYS Performed at [facility], [address], [place], [place] [address]    Report Status 06/01/2018 FINAL  Final  Culture, blood (routine x 2)     Status: None   Collection Time: 05/27/18  2:39 PM  Result Value Ref Range Status   Specimen Description BLOOD LEFT HAND  Final   Special Requests   Final    BOTTLES DRAWN AEROBIC AND ANAEROBIC Blood Culture adequate volume   Culture   Final    NO GROWTH 5 DAYS Performed at [facility], [place] [address] [place], [place] [address]    Report Status 06/01/2018 FINAL  Final  Culture, blood (routine x 2)     Status: None (Preliminary result)   Collection Time: 06/02/18  3:50 PM  Result Value Ref Range Status   Specimen Description BLOOD SITE NOT SPECIFIED  Final   Special Requests   Final    BOTTLES DRAWN AEROBIC ONLY Blood Culture results may not be optimal due to an inadequate volume of blood received in culture bottles   Culture   Final    NO GROWTH 2 DAYS Performed at [facility], [place] [address] [place] [address] [place], [place] [address]    Report Status PENDING  Incomplete  Culture, blood (routine x 2)     Status: None (Preliminary result)   Collection Time: 06/02/18  4:10 PM  Result Value Ref Range Status   Specimen Description BLOOD LEFT HAND  Final   Special Requests   Final    BOTTLES DRAWN AEROBIC AND ANAEROBIC Blood Culture adequate volume   Culture   Final    NO GROWTH 2 DAYS Performed at [facility], [place] [address], [place], [place] [address]    Report Status PENDING  Incomplete  Culture, Urine     Status: Abnormal   Collection Time: 06/02/18  4:59 PM  Result Value Ref Range Status   Specimen Description URINE, RANDOM  Final   Special Requests NONE  Final   Culture (A)  Final    <10,000 COLONIES/mL INSIGNIFICANT GROWTH Performed at [facility], [place] [address] [place], [place] [address]    Report Status  06/03/2018 FINAL  Final     Terri Piedra, NP [facility] [phone] Pager  06/05/2018  11:58 AM

## 2018-06-05 NOTE — Discharge Summary (Signed)
Discharge Summary  CAMBRIDGE DELEO KMQ:[id number] DOB: 1951-05-30  PCP: [name], MD  Admit date: 05/23/2018 Discharge date: 06/05/2018  Time spent: 67mns, more than 50% time spent on coordination of care.  Discharge to residential hospice on full comfort measures.  Discharge Diagnoses:  Active Hospital Problems   Diagnosis Date Noted  . Sepsis ([facility] [place] 05/23/2018  . Tuberculosis disease, miliary   . Dysphagia   . TB (pulmonary tuberculosis)   . Palliative care encounter   . Hypoglycemia   . Pressure injury of skin 05/24/2018  . Diarrhea 05/23/2018  . 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 Thrombocytopenia ([facility] 05/02/2018  . Sarcoidosis 05/02/2018  . Hyponatremia 05/02/2018  . Hypertension 05/02/2018  . Diabetes mellitus without complication ([facility] [id number]   Resolved Hospital Problems  No resolved problems to display.    Discharge Condition: stable  Diet recommendation: comfort measures  Filed Weights   06/03/18 0500 06/04/18 0057 06/04/18 2020  Weight: 65.6 kg (144 lb 10 oz) 66.2 kg (145 lb 15.1 oz) 66.2 kg (145 lb 15.2 oz)    History of present illness: (per admitting MD Dr KHal Hope PCP: HCharolette Forward MD  Patient coming from: Home.  Chief Complaint: Nausea vomiting and hiccups.  HPI: Jeffrey BRAMBLETTis a 67y.o. male with recently diagnosed pulmonary tuberculosis discharged home on antituberculosis medications presents to the ER with complaints of persistent nausea vomiting and diarrhea over the last few days.  Also has been having hiccups.  Has been feeling weak.  Has not been able to eat anything because of the vomiting.  Patient has been compliant with his anti-tuberculosis medications.  ED Course: In the ER patient is febrile tachycardic with elevated lactate level with labs showing hypokalemia hypomagnesemia.  CT chest abdomen pelvis was done which shows new pleural effusion and also which is concerning for diarrhea.  Blood cultures obtained in ER  physician discussed with Dr. CLinus Salmonson-call infectious disease consultant who advised patient to be started on Zosyn along with his antiretroviral.  Patient was started on fluids and potassium and magnesium replacement.    Hospital Course:  Principal Problem:   Sepsis ([facility] Active Problems:   Diabetes mellitus without complication ([facility]   Sarcoidosis   Hypertension   Thrombocytopenia (HCC)   Hyponatremia   Pulmonary tuberculosis   Diarrhea   Pressure injury of skin   Hypoglycemia   Dysphagia   TB (pulmonary tuberculosis)   Palliative care encounter   Tuberculosis disease, miliary   Sepsis ([facility] Secondary to active tuberculosis in the setting of sarcoidosis /immunosuppressed status and bilateral cavitary pneumonia. On RI PE antitubercular meds along with vitamin B6 while in the hospital ID in put appreciated -Patient now wants to stop treatment and want to be on full comfort measures He is to discharge to residential hospice on full comfort measures. Due to active TB, patient need airborne precaution and negative pressure room at residential hospice facility.  Fever: -started to spike intermittent high fever since 6/28 - repeat ua unremarkable, repeat cxr no acute interval changes, Repeat blood culture no growth -ID Dr VTommy Medalstarted patient on zosyn and steroids on 6/30 to cover possible bacteria pneumonia and possible immune reconstitution  ID following daily , appreciate input -Patient now wants to stop treatment and want to be on full comfort measures   Acute metabolic encephalopathy:  He was initially very confused and pulled out picc line and rectal tube Improving ,no agitation but remain slightly confused about the year  Type 2 diabetes  mellitus A1c of 11.8.  Has been on ssi in the hospital. Patient now wants to stop treatment and want to be on full comfort measures  Microcytic anemia hgb stable at baseline around 10   Severe protein calorie  malnutrition Pulled out PICC line on 6/25. s/p cortrack placement and has been on tube feed.  Patient now wants to stop treatment and want to be on full comfort measures    FTT Patient now wants to stop treatment and want to be on full comfort measures palliative care input appreciated,      Code Status: DNR  Family Communication: patient   Disposition Plan: residential hospice, full comfort measures    Consultants:  ID  Palliative care  Procedures:  cortack placement and tube feeding  Antibiotics:  As above   Discharge Exam: BP 136/81 (BP Location: Right Arm)   Pulse 84   Temp 98.1 F (36.7 C) (Oral)   Resp (!) 22   Ht 6' 2"  (1.88 m)   Wt 66.2 kg (145 lb 15.2 oz)   SpO2 99%   BMI 18.74 kg/m    General:  Very weak, malnourished   Cardiovascular: RRR  Respiratory: CTABL  Abdomen: Soft/ND/NT, positive BS  Musculoskeletal: No Edema  Neuro: alert, oriented to the month, place and person   Discharge Instructions You were cared for by a hospitalist during your hospital stay. If you have any questions about your discharge medications or the care you received while you were in the hospital after you are discharged, you can call the unit and asked to speak with the hospitalist on call if the hospitalist that took care of you is not available. Once you are discharged, your primary care physician will handle any further medical issues. Please note that NO REFILLS for any discharge medications will be authorized once you are discharged, as it is imperative that you return to your primary care physician (or establish a relationship with a primary care physician if you do not have one) for your aftercare needs so that they can reassess your need for medications and monitor your lab values.  Discharge Instructions    Diet general   Complete by:  As directed    Comfort feeds     Allergies as of 06/05/2018      Reactions   Tape Other (See  Comments)   Per the patient's family, his skin is VERY FRAGILE & TEARS EASILY. PLEASE use an alternative to tape.      Medication List    STOP taking these medications   acetaminophen 500 MG tablet Commonly known as:  TYLENOL   aspirin-sod bicarb-citric acid 325 MG Tbef tablet Commonly known as:  ALKA-SELTZER   ethambutol 400 MG tablet Commonly known as:  MYAMBUTOL   feeding supplement (PRO-STAT SUGAR FREE 64) Liqd   isoniazid 300 MG tablet Commonly known as:  NYDRAZID   losartan 25 MG tablet Commonly known as:  COZAAR   metoprolol tartrate 25 MG tablet Commonly known as:  LOPRESSOR   pyrazinamide 500 MG tablet   pyridOXINE 50 MG tablet Commonly known as:  B-6   rifampin 300 MG capsule Commonly known as:  RIFADIN      Allergies  Allergen Reactions  . Tape Other (See Comments)    Per the patient's family, his skin is VERY FRAGILE & TEARS EASILY. PLEASE use an alternative to tape.      The results of significant diagnostics from this hospitalization (including imaging, microbiology, ancillary and laboratory) are  listed below for reference.    Significant Diagnostic Studies: Ct Abdomen Pelvis Wo Contrast  Result Date: 05/23/2018 CLINICAL DATA:  Tuberculosis.  Worsening cough and fever. EXAM: CT CHEST, ABDOMEN AND PELVIS WITHOUT CONTRAST TECHNIQUE: Multidetector CT imaging of the chest, abdomen and pelvis was performed following the standard protocol without IV contrast. COMPARISON:  Chest radiograph 05/23/2018. Chest CTA 05/02/2018. CT abdomen and pelvis 05/05/2018. CONTRAST EXTRAVASATION CONSULTATION: Type of contrast:  Isovue 300 Site of extravasation: Left antecubital fossa Estimated volume of extravasation: 100 ml Area of extravasation scanned with CT? no PATIENT'S SIGNS AND SYMPTOMS Skin blistering/ulceration: no Decrease capillary refill: no Change in skin color: no Decreased motor function or severe tightness: no Decreased pulses distal to site of extravasation:  no Altered sensation: no Increasing pain or signs of increased swelling during observation: no TREATMENT Observation period at site: Patient returned to the emergency department for continued observation. Orders placed for limb elevation and application of ice pack. Plastic surgery consulted? no DOCUMENTATION AND FOLLOW-UP Post extravasation orders completed? yes Patient's questions answered? yes Patient instructed to call [phone] or seek immediate medical care for new/progressive symptoms. FINDINGS: CT CHEST FINDINGS Cardiovascular: Thoracic aortic atherosclerosis without aneurysm. Normal heart size. Scattered coronary artery calcification. No significant pericardial effusion. Mediastinum/Nodes: AP window lymph nodes measure up to 12 mm in short axis, similar to the prior CT. Similar 1.4 cm short axis right hilar lymph node. Mild motion and streak artifact through the subcarinal region limits evaluation of the previously described lymph node in this location. Unremarkable thyroid. Possible mild esophageal wall thickening. Lungs/Pleura: Small right pleural effusion, new from the prior CT. No pneumothorax. Innumerable nodules are again seen diffusely throughout both lungs, greatest in the upper lungs and less in the bases. Bilateral upper lobe airspace consolidation is greatest in the apices, overall stable to slightly progressed since the prior CT. Small areas of cavitation within the biapical consolidation have not significantly progressed. A small amount of patchy subpleural consolidation anteriorly in the right middle lobe is new from the prior CT, and patchy right middle lobe consolidation laterally has increased. There is mild dependent atelectasis in the right lower lobe. Musculoskeletal: Postsurgical changes at the left shoulder. Mild thoracic disc degeneration. No suspicious osseous lesion. CT ABDOMEN PELVIS FINDINGS The study is mildly motion degraded. Hepatobiliary: No focal liver abnormality is seen.  No gallstones, gallbladder wall thickening, or biliary dilatation. Pancreas: Unremarkable. Spleen: Unremarkable. Adrenals/Urinary Tract: Unchanged right adrenal nodule. Unremarkable left adrenal gland. Unchanged 11 mm left lower pole renal hypodensity, likely a cyst. Areas of subtle hypoenhancement in both kidneys on the prior CT cannot be re-evaluated today due to lack of IV contrast. There is no hydronephrosis. Unremarkable bladder. Stomach/Bowel: The stomach is largely collapsed. Scattered fluid is present throughout the colon, and there is also a moderate-sized air-fluid level in the rectum. Fluid is also noted in nondilated small bowel loops. There is no evidence of bowel obstruction. The appendix is grossly unremarkable. Vascular/Lymphatic: Mild abdominal aortic atherosclerosis without aneurysm. No enlarged lymph nodes. Reproductive: Unremarkable prostate. Other: Trace ascites, decreased from prior. No loculated fluid collection. Persistent diffusely edematous appearance of the intra-abdominal fat. Unchanged 1.5 cm calcified mesenteric nodule in the left mid abdomen (series 5, image 76). Musculoskeletal: No suspicious osseous lesion. Lower lumbar disc degeneration including severe disc space narrowing and spurring at L4-5 and L5-S1 resulting in moderate to severe bilateral neural foraminal stenosis. IMPRESSION: 1. Diffuse lung nodules and apical predominant lung consolidation, overall stable to mildly progressed from 05/02/2018  and consistent with the patient's diagnosis of tuberculosis. 2. Small right pleural effusion, new from 05/02/2018. 3. Unchanged mild mediastinal and right hilar lymphadenopathy. 4. Scattered air-fluid levels in the colon and rectum suggesting a diarrheal illness. No bowel obstruction. 5. Trace ascites, decreased from prior. 6. Unchanged calcified mesenteric nodule. 7.  Aortic Atherosclerosis (ICD10-I70.0). 8. Contrast extravasation as documented above. Electronically Signed   By: [place] Bores M.D.   On: 05/23/2018 18:12   Dg Chest 1 View  Result Date: 05/27/2018 CLINICAL DATA:  Encounter for fever and chills. EXAM: CHEST  1 VIEW COMPARISON:  Chest CT, 05/23/2018. Chest radiographs, most recent 05/23/2018. FINDINGS: Multiple bilateral small lung nodules with hazy airspace type lung opacity, most evident in the upper lobes, is without significant change. No new lung abnormalities. Heart is normal in size.  No mediastinal or hilar masses. No convincing pleural effusion.  No pneumothorax. IMPRESSION: 1. No significant change in the previously described bilateral lung opacities and numerous bilateral lung nodules. 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 Electronically Signed   By: Lajean Manes M.D.   On: 05/27/2018 13:25   Ct Chest Wo Contrast  Result Date: 05/23/2018 CLINICAL DATA:  Tuberculosis.  Worsening cough and fever. EXAM: CT CHEST, ABDOMEN AND PELVIS WITHOUT CONTRAST TECHNIQUE: Multidetector CT imaging of the chest, abdomen and pelvis was performed following the standard protocol without IV contrast. COMPARISON:  Chest radiograph 05/23/2018. Chest CTA 05/02/2018. CT abdomen and pelvis 05/05/2018. CONTRAST EXTRAVASATION CONSULTATION: Type of contrast:  Isovue 300 Site of extravasation: Left antecubital fossa Estimated volume of extravasation: 100 ml Area of extravasation scanned with CT? no PATIENT'S SIGNS AND SYMPTOMS Skin blistering/ulceration: no Decrease capillary refill: no Change in skin color: no Decreased motor function or severe tightness: no Decreased pulses distal to site of extravasation: no Altered sensation: no Increasing pain or signs of increased swelling during observation: no TREATMENT Observation period at site: Patient returned to the emergency department for continued observation. Orders placed for limb elevation and application of ice pack. Plastic surgery consulted? no DOCUMENTATION AND FOLLOW-UP Post extravasation orders completed? yes Patient's  questions answered? yes Patient instructed to call [phone] or seek immediate medical care for new/progressive symptoms. FINDINGS: CT CHEST FINDINGS Cardiovascular: Thoracic aortic atherosclerosis without aneurysm. Normal heart size. Scattered coronary artery calcification. No significant pericardial effusion. Mediastinum/Nodes: AP window lymph nodes measure up to 12 mm in short axis, similar to the prior CT. Similar 1.4 cm short axis right hilar lymph node. Mild motion and streak artifact through the subcarinal region limits evaluation of the previously described lymph node in this location. Unremarkable thyroid. Possible mild esophageal wall thickening. Lungs/Pleura: Small right pleural effusion, new from the prior CT. No pneumothorax. Innumerable nodules are again seen diffusely throughout both lungs, greatest in the upper lungs and less in the bases. Bilateral upper lobe airspace consolidation is greatest in the apices, overall stable to slightly progressed since the prior CT. Small areas of cavitation within the biapical consolidation have not significantly progressed. A small amount of patchy subpleural consolidation anteriorly in the right middle lobe is new from the prior CT, and patchy right middle lobe consolidation laterally has increased. There is mild dependent atelectasis in the right lower lobe. Musculoskeletal: Postsurgical changes at the left shoulder. Mild thoracic disc degeneration. No suspicious osseous lesion. CT ABDOMEN PELVIS FINDINGS The study is mildly motion degraded. Hepatobiliary: No focal liver abnormality is seen. No gallstones, gallbladder wall thickening, or biliary dilatation. Pancreas: Unremarkable. Spleen: Unremarkable. Adrenals/Urinary Tract: Unchanged right adrenal  nodule. Unremarkable left adrenal gland. Unchanged 11 mm left lower pole renal hypodensity, likely a cyst. Areas of subtle hypoenhancement in both kidneys on the prior CT cannot be re-evaluated today due to lack of  IV contrast. There is no hydronephrosis. Unremarkable bladder. Stomach/Bowel: The stomach is largely collapsed. Scattered fluid is present throughout the colon, and there is also a moderate-sized air-fluid level in the rectum. Fluid is also noted in nondilated small bowel loops. There is no evidence of bowel obstruction. The appendix is grossly unremarkable. Vascular/Lymphatic: Mild abdominal aortic atherosclerosis without aneurysm. No enlarged lymph nodes. Reproductive: Unremarkable prostate. Other: Trace ascites, decreased from prior. No loculated fluid collection. Persistent diffusely edematous appearance of the intra-abdominal fat. Unchanged 1.5 cm calcified mesenteric nodule in the left mid abdomen (series 5, image 76). Musculoskeletal: No suspicious osseous lesion. Lower lumbar disc degeneration including severe disc space narrowing and spurring at L4-5 and L5-S1 resulting in moderate to severe bilateral neural foraminal stenosis. IMPRESSION: 1. Diffuse lung nodules and apical predominant lung consolidation, overall stable to mildly progressed from 05/02/2018 and consistent with the patient's diagnosis of tuberculosis. 2. Small right pleural effusion, new from 05/02/2018. 3. Unchanged mild mediastinal and right hilar lymphadenopathy. 4. Scattered air-fluid levels in the colon and rectum suggesting a diarrheal illness. No bowel obstruction. 5. Trace ascites, decreased from prior. 6. Unchanged calcified mesenteric nodule. 7.  Aortic Atherosclerosis (ICD10-I70.0). 8. Contrast extravasation as documented above. Electronically Signed   By: [place] Bores M.D.   On: 05/23/2018 18:12   Dg Chest Port 1 View  Result Date: 06/02/2018 CLINICAL DATA:  Shortness of breath.  Sepsis. EXAM: PORTABLE CHEST 1 VIEW COMPARISON:  One-view chest x-ray 05/27/2018. FINDINGS: Bilateral upper lobe miliary pneumonia is not significantly changed. A small bore feeding tube courses off the inferior border of the film. There is no  pneumothorax. No consolidation or cavitary lesion is present. IMPRESSION: Stable appearance of upper lobe predominant miliary pneumonia compatible with TB. Electronically Signed   By: [place] Morelle M.D.   On: 06/02/2018 16:04   Dg Chest Port 1 View  Result Date: 05/23/2018 CLINICAL DATA:  Shortness of breath and fever. EXAM: PORTABLE CHEST 1 VIEW COMPARISON:  05/10/2018 and prior chest radiographs FINDINGS: Cardiomediastinal silhouette is unchanged. Bilateral airspace opacities, greatest in the UPPER lungs, have not significantly changed. No pleural effusion or pneumothorax. No acute bony abnormalities identified. IMPRESSION: Unchanged bilateral airspace opacities, UPPER lobe predominance. Electronically Signed   By: Margarette Canada M.D.   On: 05/23/2018 15:55   Dg Chest Port 1 View  Result Date: 05/10/2018 CLINICAL DATA:  Shortness of breath. EXAM: PORTABLE CHEST 1 VIEW COMPARISON:  Chest CT 05/02/2018 and chest radiograph 05/02/2018. FINDINGS: Cardiomediastinal silhouette is normal. Mediastinal contours appear intact. Low lung volumes. Worsening aeration of the lungs with increasing bilateral upper lobe predominant airspace consolidation, and cavitary spaces in the upper lobes. Osseous structures are without acute abnormality. Soft tissues are grossly normal. IMPRESSION: Worsening aeration of the lungs with increasing bilateral upper lobe predominant airspace consolidation, and cavitary spaces in the upper lobes. Electronically Signed   By: Fidela Salisbury M.D.   On: 05/10/2018 09:40   Dg Abd Portable 1v  Result Date: 05/31/2018 CLINICAL DATA:  Feeding tube placement EXAM: PORTABLE ABDOMEN - 1 VIEW COMPARISON:  05/23/2018 CT and 12/23/2017 radiographs FINDINGS: The tip of a feeding tube is seen in the left upper quadrant of the abdomen, projecting over the expected location of the gastric fundus. No evidence of bowel obstruction. No free  air. Lower lumbar facet arthropathy and degenerative disc  disease. IMPRESSION: Feeding tube tip is seen projecting over the expected location of the gastric fundus. Electronically Signed   By: Ashley Royalty M.D.   On: 05/31/2018 17:47   Ct Bone Marrow Biopsy & Aspiration  Result Date: 05/10/2018 INDICATION: 67 year old male with sarcoidosis, thrombocytopenia an active TB. He presents for bone marrow biopsy and culture. EXAM: CT GUIDED BONE MARROW ASPIRATION AND CORE BIOPSY Interventional Radiologist:  Criselda Peaches, MD MEDICATIONS: None. ANESTHESIA/SEDATION: Moderate (conscious) sedation was employed during this procedure. A total of 1.5 milligrams versed and 50 micrograms fentanyl were administered intravenously. The patient's level of consciousness and vital signs were monitored continuously by radiology nursing throughout the procedure under my direct supervision. Total monitored sedation time: 10 minutes FLUOROSCOPY TIME:  Fluoroscopy Time: 0 minutes 0 seconds (0 mGy). COMPLICATIONS: None immediate. Estimated blood loss: <25 mL PROCEDURE: Informed written consent was obtained from the patient after a thorough discussion of the procedural risks, benefits and alternatives. All questions were addressed. Maximal Sterile Barrier Technique was utilized including caps, mask, sterile gowns, sterile gloves, sterile drape, hand hygiene and skin antiseptic. A timeout was performed prior to the initiation of the procedure. The patient was positioned prone and non-contrast localization CT was performed of the pelvis to demonstrate the iliac marrow spaces. Maximal barrier sterile technique utilized including caps, mask, sterile gowns, sterile gloves, large sterile drape, hand hygiene, and betadine prep. Under sterile conditions and local anesthesia, an 11 gauge coaxial bone biopsy needle was advanced into the right iliac marrow space. Needle position was confirmed with CT imaging. Initially, bone marrow aspiration was performed. Next, the 11 gauge outer cannula was utilized  to obtain a right iliac bone marrow core biopsy. Needle was removed. Hemostasis was obtained with compression. The patient tolerated the procedure well. Samples were prepared with the cytotechnologist. IMPRESSION: Technically successful CT-guided bone marrow biopsy, aspiration and bone culture. Signed, Criselda Peaches, MD [facility] Electronically Signed   By: Jacqulynn Cadet M.D.   On: 05/10/2018 09:53   [place] Ekg Site Rite  Result Date: 05/29/2018 If Site Rite image not attached, placement could not be confirmed due to current cardiac rhythm.  US Abdomen Limited Ruq  Result Date: 05/09/2018 CLINICAL DATA:  Abnormal LFTs EXAM: ULTRASOUND ABDOMEN LIMITED RIGHT UPPER QUADRANT COMPARISON:  CT 05/05/2018 FINDINGS: Gallbladder: Marked gallbladder wall thickening measuring up to 9 mm. No stones or sonographic Murphy's sign. Common bile duct: Diameter: Normal caliber, 4 mm Liver: No focal lesion identified. Within normal limits in parenchymal echogenicity. Portal vein is patent on color Doppler imaging with normal direction of blood flow towards the liver. Also noted is a right pleural effusion and ascites. IMPRESSION: Markedly thickened gallbladder wall. No gallstones visualized. This could be related to liver disease or cholecystitis. Right pleural effusion, mild perihepatic ascites. Electronically Signed   By: Rolm Baptise M.D.   On: 05/09/2018 08:31    Microbiology: Recent Results (from the past 240 hour(s))  Culture, blood (routine x 2)     Status: None   Collection Time: 05/27/18  2:25 PM  Result Value Ref Range Status   Specimen Description BLOOD LEFT ANTECUBITAL  Final   Special Requests   Final    BOTTLES DRAWN AEROBIC AND ANAEROBIC Blood Culture adequate volume   Culture   Final    NO GROWTH 5 DAYS Performed at [facility], [address], [place], [place] [address]  Report Status 06/01/2018 FINAL  Final  Culture,  blood (routine x 2)     Status: None   Collection Time: 05/27/18  2:39 PM  Result Value Ref Range Status   Specimen Description BLOOD LEFT HAND  Final   Special Requests   Final    BOTTLES DRAWN AEROBIC AND ANAEROBIC Blood Culture adequate volume   Culture   Final    NO GROWTH 5 DAYS Performed at [facility], [address] [place], [place] [address]    Report Status 06/01/2018 FINAL  Final  Culture, blood (routine x 2)     Status: None (Preliminary result)   Collection Time: 06/02/18  3:50 PM  Result Value Ref Range Status   Specimen Description BLOOD SITE NOT SPECIFIED  Final   Special Requests   Final    BOTTLES DRAWN AEROBIC ONLY Blood Culture results may not be optimal due to an inadequate volume of blood received in culture bottles   Culture   Final    NO GROWTH 3 DAYS Performed at [facility], [place] [address] [place], [place] [address]    Report Status PENDING  Incomplete  Culture, blood (routine x 2)     Status: None (Preliminary result)   Collection Time: 06/02/18  4:10 PM  Result Value Ref Range Status   Specimen Description BLOOD LEFT HAND  Final   Special Requests   Final    BOTTLES DRAWN AEROBIC AND ANAEROBIC Blood Culture adequate volume   Culture   Final    NO GROWTH 3 DAYS Performed at [facility], [place] [address] [place], [place] [address]    Report Status PENDING  Incomplete  Culture, Urine     Status: Abnormal   Collection Time: 06/02/18  4:59 PM  Result Value Ref Range Status   Specimen Description URINE, RANDOM  Final   Special Requests NONE  Final   Culture (A)  Final    <10,000 COLONIES/mL INSIGNIFICANT GROWTH Performed at [facility], [place] [address], [place], [place] [address]    Report Status 06/03/2018 FINAL  Final     Labs: Basic Metabolic Panel: Recent Labs  Lab 05/30/18 0530 05/31/18 1435 05/31/18 1734 06/01/18 0644 06/01/18 2132 06/02/18 0915 06/04/18 0636 06/05/18 0458  NA 137  --   --   --   --  136  135 134*  K 3.3*  --   --   --   --  4.6 3.8 4.2  CL 105  --   --   --   --  105 105 99  CO2 22  --   --   --   --  22 25 26   GLUCOSE 105*  --   --   --   --  185* 127* 293*  BUN <5*  --   --   --   --  <5* 6* 8  CREATININE 0.80  --   --   --   --  0.59* 0.72 0.65  CALCIUM 7.2*  --   --   --   --  7.5* 7.6* 8.1*  MG 1.5* 1.5* 1.5* 1.5* 1.4* 1.5* 1.8  --   PHOS 1.8* 2.9 2.9 2.7 2.8 2.8  --   --    Liver Function Tests: Recent Labs  Lab 05/30/18 0530 06/05/18 0458  AST 80* 69*  ALT 28 29  ALKPHOS 572* 423*  BILITOT 1.4* 0.8  PROT 4.7* 5.1*  ALBUMIN 1.4* 1.4*  No results for input(s): LIPASE, AMYLASE in the last 168 hours. Recent Labs  Lab 05/30/18 1155  AMMONIA 42*   CBC: Recent Labs  Lab 05/30/18 0530 06/04/18 0636  WBC 7.3 8.6  NEUTROABS  --  6.5  HGB 10.4* 9.7*  HCT 31.7* 30.1*  MCV 76.9* 79.4  PLT 244 176   Cardiac Enzymes: No results for input(s): CKTOTAL, CKMB, CKMBINDEX, TROPONINI in the last 168 hours. BNP: BNP (last 3 results) Recent Labs    05/24/18 0852  BNP 95.3    ProBNP (last 3 results) No results for input(s): PROBNP in the last 8760 hours.  CBG: Recent Labs  Lab 06/04/18 1610 06/04/18 2023 06/05/18 0002 06/05/18 0408 06/05/18 0755  GLUCAP 184* 222* 350* 290* 203*       Signed:  Florencia Reasons MD, PhD  [facility] 06/05/2018, 1:49 PM

## 2018-06-05 NOTE — Progress Notes (Signed)
Daily Progress Note   Patient Name: Jeffrey Hayes       Date: 06/05/2018 DOB: 05/20/1951  Age: 67 y.o. MRN#: [id number] Attending Physician: Florencia Reasons, MD Primary Care Physician: [name], MD Admit Date: 05/23/2018  Reason for Consultation/Follow-up: Establishing goals of care, Non pain symptom management, Pain control and Psychosocial/spiritual support  Subjective:  I received a call from Dr. Erlinda Hong this am that the patient is in his right mind and has declared "I'm done".  Per patient "I want to go home, home.   I want to go to heaven."  Patient's wife and sister at bedside confirm that patient is in his right mind and that the family is supportive of him.  They want him to be comfortable.  Patient reports generalized pain all over.  He denies constipation, insomnia.  Patient told me he wanted a honey bun.   Assessment: Patient being treated for disseminated TB.  He has had tremendous weight loss and weakness.  Fevers of 102-103 over the weekend.   Barely eating sips / bites.   Patient requesting full comfort care.     Patient Profile/HPI:  67 y.o. 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  He has been losing weight 60 lbs over the last year and had great difficulty with eating and appetite.  He has become too weak to get out of bed.   Length of Stay: 13  Current Medications: Scheduled Meds:  . baclofen  5 mg Oral TID  . calcium carbonate  400 mg of elemental calcium Oral BID WC  . feeding supplement  1 Container Oral TID BM  .  HYDROmorphone (DILAUDID) injection  0.5 mg Intravenous Q8H  . LORazepam  0.5 mg Intravenous BID  . magic mouthwash  10 mL Oral TID  .  prochlorperazine  5 mg Oral TID AC  . sodium chloride flush  3 mL Intravenous Q12H    Continuous Infusions: . sodium chloride 10 mL/hr at 05/25/18 1311  . sodium chloride 10 mL/hr at 06/04/18 2114  . sodium chloride    . fluconazole (DIFLUCAN) IV 100 mL/hr at 06/04/18 1340    PRN Meds: sodium chloride, acetaminophen **OR** acetaminophen, antiseptic oral rinse, diphenoxylate-atropine, gi cocktail, glycopyrrolate **  OR** glycopyrrolate **OR** glycopyrrolate, haloperidol **OR** haloperidol **OR** haloperidol lactate, HYDROmorphone (DILAUDID) injection, loperamide, ondansetron **OR** ondansetron (ZOFRAN) IV, polyvinyl alcohol, promethazine, sodium chloride flush, sodium chloride flush  Physical Exam        Well developed thin male, pleasant, coherent, appears very weak.  Wife and sister at bedside. CV rrr resp no distress  Vital Signs: BP 136/81 (BP Location: Right Arm)   Pulse 84   Temp 98.1 F (36.7 C) (Oral)   Resp (!) 22   Ht 6\' 2"  (1.88 m)   Wt 66.2 kg (145 lb 15.2 oz)   SpO2 99%   BMI 18.74 kg/m  SpO2: SpO2: 99 % O2 Device: O2 Device: Room Air O2 Flow Rate:    Intake/output summary:   Intake/Output Summary (Last 24 hours) at 06/05/2018 1200 Last data filed at 06/05/2018 0600 Gross per 24 hour  Intake 1762.65 ml  Output 575 ml  Net 1187.65 ml   LBM: Last BM Date: 06/04/18 Baseline Weight: Weight: 68.5 kg (151 lb) Most recent weight: Weight: 66.2 kg (145 lb 15.2 oz)       Palliative Assessment/Data: 20%      Patient Active Problem List   Diagnosis Date Noted  . Dysphagia   . TB (pulmonary tuberculosis)   . Palliative care encounter   . Hypoglycemia   . Pressure injury of skin 05/24/2018  . Sepsis ([place]) 05/23/2018  . Diarrhea 05/23/2018  . Protein-calorie malnutrition, severe 05/13/2018  . 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 Cavitary lesion of lung   . HCAP (healthcare-associated pneumonia)   . Hypoxemia   . Community acquired pneumonia   . Intractable  hiccups 05/04/2018  . Severe sepsis ([place]) 05/03/2018  . Diabetes mellitus without complication ([place]) 29/92/4268  . Sarcoidosis 05/02/2018  . Hypertension 05/02/2018  . Cavitary pneumonia 05/02/2018  . Thrombocytopenia ([place]) 05/02/2018  . Hyponatremia 05/02/2018  . LFTs abnormal 05/02/2018  . Nodule of right lung 04/15/2017    Palliative Care Plan    Recommendations/Plan:  Will d/c all medications and interventions no related to comfort.  Recommend continuing fluconazole, nausea meds, boost (if he likes it)  Will schedule very low dose benzodiazepine and low dose narcotic for comfort.    Goals of Care and Additional Recommendations:  Limitations on Scope of Treatment: Full Comfort Care  Code Status:  DNR  Prognosis:   < 2 weeks.  Active disseminated TB in a very pleasant gentleman and family.  Eating less than bites and sips.  Requiring scheduled medications for pain, nausea.  Discharge Planning:  To Be Determined  Need to find a hospice house with a negative pressure room.  Wife unable to care for him at home.  Does not qualify for SNF.    Care plan was discussed with Dr. Erlinda Hong, bedside RN, wife, sister, patient.  Thank you for allowing the Palliative Medicine Team to assist in the care of this patient.  Total time spent:  35 min.     Greater than 50%  of this time was spent counseling and coordinating care related to the above assessment and plan.  Florentina Jenny, PA-C Palliative Medicine  Please contact Palliative MedicineTeam phone at [phone] for questions and concerns between 7 am - 7 pm.   Please see AMION for individual provider pager numbers.

## 2018-06-05 NOTE — Progress Notes (Signed)
Palliative Medicine RN Note: Rec'd request from PMT PA Florentina Jenny to assist with finding a hospice that can accommodate a patient with active TB.  [facility] the patient and has spoken to family. I faxed clinical information. They need to speak with SW to set up transport; I left a message for Lorriane Shire requesting she call hospice contact Marval Regal at [phone].  Marjie Skiff Kelis Plasse, RN, BSN, [facility] Palliative Medicine Team 06/05/2018 1:19 PM Office [phone]

## 2018-06-05 NOTE — Progress Notes (Signed)
Pt d/cing to Hospice, report called on pt to go Hospice, all questions answered,

## 2018-06-05 NOTE — Progress Notes (Signed)
PROGRESS NOTE  Jeffrey Hayes TZG:[id number] DOB: 04-10-1951 DOA: 05/23/2018 PCP: [name], MD  HPI/Recap of past 24 hours:  Patient wish to stop treatment, he wants to be on full comfort measures   Assessment/Plan: Principal Problem:   Sepsis ([place]) Active Problems:   Diabetes mellitus without complication ([place])   Sarcoidosis   Hypertension   Thrombocytopenia ([place])   Hyponatremia   Pulmonary tuberculosis   Diarrhea   Pressure injury of skin   Hypoglycemia   Dysphagia   TB (pulmonary tuberculosis)   Palliative care encounter  Sepsis ([place]) Secondary to active tuberculosis in the setting of sarcoidosis /immunosuppressed status and bilateral cavitary pneumonia. On RI PE antitubercular meds along with vitamin B6.  Switched rifampin to IV for better penetration ID in put appreciated   Fever: -started to spike intermittent high fever since 6/28 - repeat ua unremarkable, repeat cxr no acute interval changes, Repeat blood culture no growth -ID Dr Tommy Medal started patient on zosyn and steroids on 6/30 to cover possible bacteria pneumonia and possible immune reconstitution  ID following daily , appreciate input -Patient now wants to stop treatment and want to be on full comfort measures   Acute metabolic encephalopathy:  He was initially very confused and pulled out picc line and rectal tube Improving ,no agitation but remain slightly confused about the year  Type 2 diabetes mellitus A1c of 11.8.   Has been on ssi in the hospital. Patient now wants to stop treatment and want to be on full comfort measures  Microcytic anemia hgb stable at baseline around 10   Severe protein calorie malnutrition Pulled out PICC line on 6/25.  s/p cortrack placement and has been on tube feed.   Patient now wants to stop treatment and want to be on full comfort measures     FTT Patient now wants to stop treatment and want to be on full comfort measures palliative care input  appreciated,      Code Status: DNR  Family Communication: patient   Disposition Plan: Patient now wants to stop treatment and want to be on full comfort measures Palliative care input appreciated, due to active TB, difficult to find accepting facility   Consultants:  ID  Palliative care  Procedures:  cortack placement and tube feeding  Antibiotics:  As above   Objective: BP 122/78 (BP Location: Left Arm)   Pulse 74   Temp 98.9 F (37.2 C)   Resp 16   Ht 6\' 2"  (1.88 m)   Wt 66.2 kg (145 lb 15.2 oz)   SpO2 98%   BMI 18.74 kg/m   Intake/Output Summary (Last 24 hours) at 06/05/2018 0820 Last data filed at 06/05/2018 0600 Gross per 24 hour  Intake 1982.65 ml  Output 775 ml  Net 1207.65 ml   Filed Weights   06/03/18 0500 06/04/18 0057 06/04/18 2020  Weight: 65.6 kg (144 lb 10 oz) 66.2 kg (145 lb 15.1 oz) 66.2 kg (145 lb 15.2 oz)    Exam: Patient is examined daily including today on 06/05/2018, exams remain the same as of yesterday except that has changed    General:  Very weak, malnourished   Cardiovascular: RRR  Respiratory: CTABL  Abdomen: Soft/ND/NT, positive BS  Musculoskeletal: No Edema  Neuro: alert, oriented to the month, place and person  Data Reviewed: Basic Metabolic Panel: Recent Labs  Lab 05/30/18 0530 05/31/18 1435 05/31/18 1734 06/01/18 0644 06/01/18 2132 06/02/18 0915 06/04/18 0636 06/05/18 0458  NA 137  --   --   --   --  136 135 134*  K 3.3*  --   --   --   --  4.6 3.8 4.2  CL 105  --   --   --   --  105 105 99  CO2 22  --   --   --   --  22 25 26   GLUCOSE 105*  --   --   --   --  185* 127* 293*  BUN <5*  --   --   --   --  <5* 6* 8  CREATININE 0.80  --   --   --   --  0.59* 0.72 0.65  CALCIUM 7.2*  --   --   --   --  7.5* 7.6* 8.1*  MG 1.5* 1.5* 1.5* 1.5* 1.4* 1.5* 1.8  --   PHOS 1.8* 2.9 2.9 2.7 2.8 2.8  --   --    Liver Function Tests: Recent Labs  Lab 05/30/18 0530 06/05/18 0458  AST 80* 69*  ALT 28 29  ALKPHOS  572* 423*  BILITOT 1.4* 0.8  PROT 4.7* 5.1*  ALBUMIN 1.4* 1.4*   No results for input(s): LIPASE, AMYLASE in the last 168 hours. Recent Labs  Lab 05/30/18 1155  AMMONIA 42*   CBC: Recent Labs  Lab 05/30/18 0530 06/04/18 0636  WBC 7.3 8.6  NEUTROABS  --  6.5  HGB 10.4* 9.7*  HCT 31.7* 30.1*  MCV 76.9* 79.4  PLT 244 176   Cardiac Enzymes:   No results for input(s): CKTOTAL, CKMB, CKMBINDEX, TROPONINI in the last 168 hours. BNP (last 3 results) Recent Labs    05/24/18 0852  BNP 95.3    ProBNP (last 3 results) No results for input(s): PROBNP in the last 8760 hours.  CBG: Recent Labs  Lab 06/04/18 1610 06/04/18 2023 06/05/18 0002 06/05/18 0408 06/05/18 0755  GLUCAP 184* 222* 350* 290* 203*    Recent Results (from the past 240 hour(s))  Culture, blood (routine x 2)     Status: None   Collection Time: 05/27/18  2:25 PM  Result Value Ref Range Status   Specimen Description BLOOD LEFT ANTECUBITAL  Final   Special Requests   Final    BOTTLES DRAWN AEROBIC AND ANAEROBIC Blood Culture adequate volume   Culture   Final    NO GROWTH 5 DAYS Performed at [facility], [place] [address], [place], [place] [address]    Report Status 06/01/2018 FINAL  Final  Culture, blood (routine x 2)     Status: None   Collection Time: 05/27/18  2:39 PM  Result Value Ref Range Status   Specimen Description BLOOD LEFT HAND  Final   Special Requests   Final    BOTTLES DRAWN AEROBIC AND ANAEROBIC Blood Culture adequate volume   Culture   Final    NO GROWTH 5 DAYS Performed at [facility], [place] [address], [place], [place] [address]    Report Status 06/01/2018 FINAL  Final  Culture, blood (routine x 2)     Status: None (Preliminary result)   Collection Time: 06/02/18  3:50 PM  Result Value Ref Range Status   Specimen Description BLOOD SITE NOT SPECIFIED  Final   Special Requests   Final    BOTTLES DRAWN AEROBIC ONLY Blood Culture results may not be optimal due to an  inadequate volume of blood received in culture bottles   Culture   Final    NO GROWTH 2 DAYS Performed at [facility], [place] [address], [place], [place] [address]    Report Status PENDING  Incomplete  Culture, blood (routine x 2)     Status: None (Preliminary result)   Collection Time: 06/02/18  4:10 PM  Result Value Ref Range Status   Specimen Description BLOOD LEFT HAND  Final   Special Requests   Final    BOTTLES DRAWN AEROBIC AND ANAEROBIC Blood Culture adequate volume   Culture   Final    NO GROWTH 2 DAYS Performed at [facility], [place] [address] [place], [place] [address]    Report Status PENDING  Incomplete  Culture, Urine     Status: Abnormal   Collection Time: 06/02/18  4:59 PM  Result Value Ref Range Status   Specimen Description URINE, RANDOM  Final   Special Requests NONE  Final   Culture (A)  Final    <10,000 COLONIES/mL INSIGNIFICANT GROWTH Performed at [facility], [place] [address], [place],  [address]    Report Status 06/03/2018 FINAL  Final     Studies: No results found.  Scheduled Meds: . baclofen  5 mg Oral TID  . calcium carbonate  400 mg of elemental calcium Oral BID WC  . cholestyramine light  4 g Oral BID AC  . enoxaparin (LOVENOX) injection  40 mg Subcutaneous Q24H  . ethambutol  1,200 mg Oral Daily  . feeding supplement  1 Container Oral TID BM  . insulin aspart  0-9 Units Subcutaneous Q4H  . isoniazid  300 mg Oral Daily  . magic mouthwash  10 mL Oral TID  . magnesium oxide  400 mg Oral Daily  . methylPREDNISolone (SOLU-MEDROL) injection  20 mg Intravenous Q6H  . metoprolol tartrate  2.5 mg Intravenous Q6H  . multivitamin with minerals  1 tablet Oral Daily  . prochlorperazine  5 mg Oral TID AC  . pyrazinamide  1,500 mg Oral Daily  . pyridOXINE  50 mg Oral Daily    Continuous Infusions: . sodium chloride 10 mL/hr at 05/25/18 1311  . sodium chloride 10 mL/hr at 06/04/18 2114  . feeding supplement (GLUCERNA 1.2 CAL)  1,000 mL (06/04/18 1150)  . fluconazole (DIFLUCAN) IV 100 mL/hr at 06/04/18 1340  . piperacillin-tazobactam (ZOSYN)  IV 3.375 g (06/04/18 2114)  . rifampin (RIFADIN) IVPB 600 mg (06/04/18 1131)     Time spent: 64mins, case discussed with palliative care I have personally reviewed and interpreted on  06/05/2018 daily labs, imagings as discussed above under date review session and assessment and plans.  I reviewed all nursing notes, pharmacy notes, consultant notes,  vitals, pertinent old records  I have discussed plan of care as described above with RN , patient  on 06/05/2018   Florencia Reasons MD, PhD  [facility] Pager [phone]. If 7PM-7AM, please contact night-coverage at [email or web address], password [facility] 06/05/2018, 8:20 AM  LOS: 13 days

## 2018-06-05 NOTE — Clinical Social Work Note (Addendum)
CSW facilitated patient discharge including contacting patient family (Wife: Giovan Pinsky) and facility to confirm patient discharge plans. Clinical information faxed to facility and family agreeable with plan. CSW arranged ambulance [facility] via [facility]. RN to call report prior to discharge [phone]).  MD aware DNR on chart to be signed.  CSW will sign off for now as social work intervention is no longer needed. Please consult [place] again if new needs arise.  Day[place] Scrape, [place]

## 2018-06-05 NOTE — Progress Notes (Signed)
Pt d/c to [facility], all questions answered

## 2018-06-05 NOTE — Clinical Social Work Note (Signed)
CSW spoke with Juliann Pulse at [facility] [place]. She confirmed bed availability for today. Discharge summary needs to note airborne precautions, TB, and managing pain and nausea. CSW paged MD to notify.   Dayton Scrape, [place]

## 2018-06-07 LAB — CULTURE, BLOOD (ROUTINE X 2)
CULTURE: NO GROWTH
Culture: NO GROWTH
SPECIAL REQUESTS: ADEQUATE

## 2018-06-07 LAB — PULMONARY FUNCTION TEST
DL/VA % PRED: 94 %
DL/VA: 4.51 ml/min/mmHg/L
DLCO cor % pred: 69 %
DLCO cor: 24.93 ml/min/mmHg
DLCO unc % pred: 69 %
DLCO unc: 25.07 ml/min/mmHg
FEF 25-75 PRE: 2.1 L/s
FEF2575-%PRED-PRE: 71 %
FEV1-%Pred-Pre: 78 %
FEV1-Pre: 2.61 L
FEV1FVC-%PRED-PRE: 98 %
FEV6-%Pred-Pre: 81 %
FEV6-PRE: 3.4 L
FEV6FVC-%PRED-PRE: 103 %
FVC-%PRED-PRE: 79 %
FVC-PRE: 3.43 L
PRE FEV6/FVC RATIO: 99 %
Pre FEV1/FVC ratio: 76 %

## 2018-06-08 LAB — AFB ORGANISM ID BY DNA PROBE
M TUBERCULOSIS COMPLEX: POSITIVE — AB
M avium complex: NEGATIVE

## 2018-06-08 LAB — ACID FAST CULTURE WITH REFLEXED SENSITIVITIES: ACID FAST CULTURE - AFSCU3: POSITIVE — AB

## 2018-06-16 LAB — RAPID GROWER BROTH SUSCEP.
Cefoxitin: 8
Ciprofloxacin: 0.5

## 2018-06-16 LAB — AFB ORGANISM ID BY DNA PROBE
M avium complex: NEGATIVE
M tuberculosis complex: NEGATIVE

## 2018-06-16 LAB — ACID FAST CULTURE WITH REFLEXED SENSITIVITIES: ACID FAST CULTURE - AFSCU3: POSITIVE — AB

## 2018-06-16 LAB — ORG ID BY SEQUENCING RFLX AST

## 2018-06-21 LAB — ACID FAST CULTURE WITH REFLEXED SENSITIVITIES: ACID FAST CULTURE - AFSCU3: NEGATIVE

## 2018-06-21 LAB — ACID FAST CULTURE WITH REFLEXED SENSITIVITIES (MYCOBACTERIA)

## 2018-07-06 DEATH — deceased

## 2018-08-19 IMAGING — US IR PARACENTESIS
1 series · 3 of 3 positions shown · non-contrast
Comparison: none

INDICATION: History of cirrhosis with recurrent abdominal ascites. Request for
therapeutic paracentesis

[Series 1: ir (id) (id)/(id)/(id) ir · 3 of 3 slices shown]
[im 1/3]
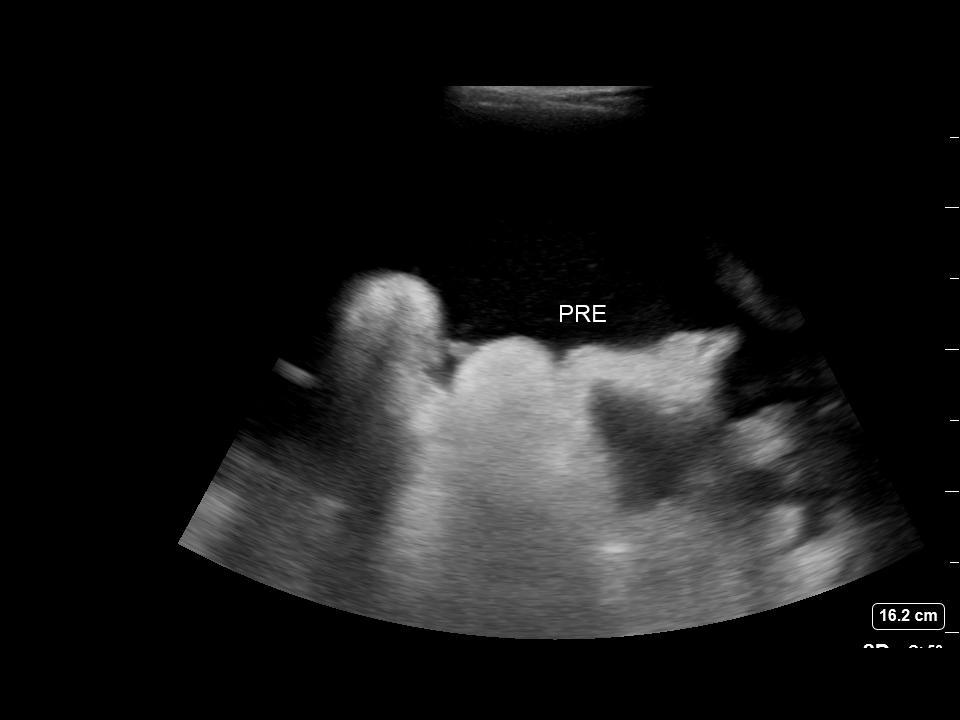
[im 2/3]
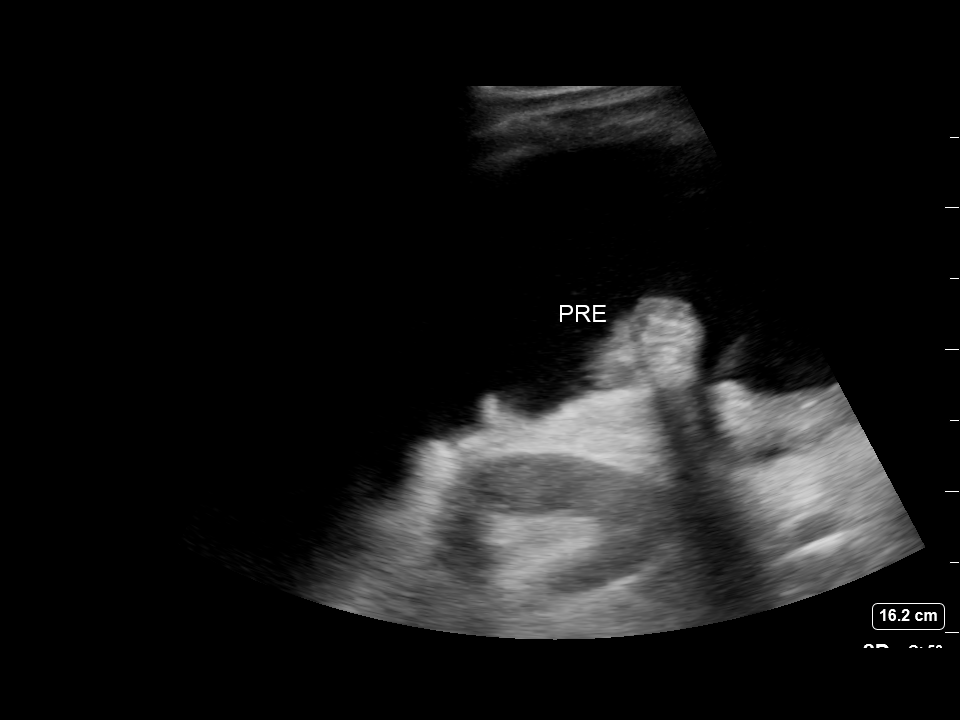
[im 3/3]
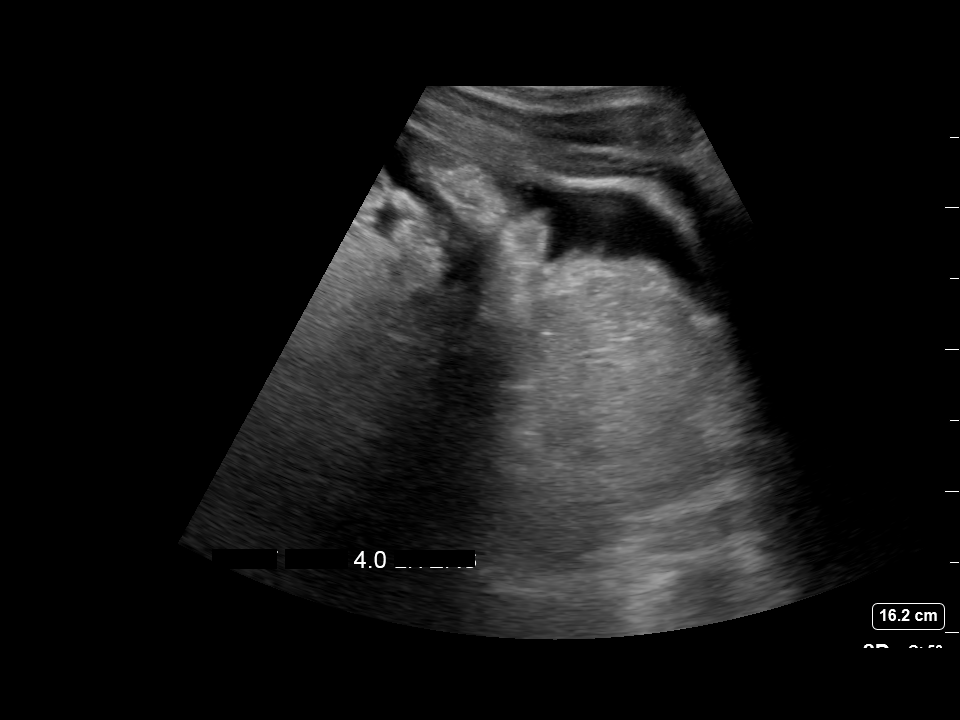

[3 of 3 positions shown; findings below may reference images not displayed]

EXAM:
ULTRASOUND GUIDED RIGHT LATERAL ABDOMEN PARACENTESIS

MEDICATIONS:
2% Lidocaine = 10 mL

COMPLICATIONS:
None immediate.

PROCEDURE:
Informed written consent was obtained from the patient after a
discussion of the risks, benefits and alternatives to treatment. A
timeout was performed prior to the initiation of the procedure.

Initial ultrasound scanning demonstrates a large amount of ascites
within the right lateral abdomen. The right lateral abdomen was
prepped and draped in the usual sterile fashion. 1% lidocaine with
epinephrine was used for local anesthesia.

Following this, a 19 gauge, 7-cm, Yueh catheter was introduced. An
ultrasound image was saved for documentation purposes. The
paracentesis was performed. The catheter was removed and a dressing
was applied. The patient tolerated the procedure well without
immediate post procedural complication.
FINDINGS: A total of approximately 4 liters of clear yellow fluid was removed.
IMPRESSION: Successful ultrasound-guided paracentesis yielding 4 liters of
peritoneal fluid.

## 2018-11-29 IMAGING — CT CT CHEST W/O CM
2 of 4 series · 11 of 36 positions shown, 13 images · IV contrast (Omni 300)
Comparison: Chest radiograph 05/23/2018.

CLINICAL DATA: Tuberculosis.  Worsening cough and fever.

EXAM:
CT CHEST, ABDOMEN AND PELVIS WITHOUT CONTRAST
TECHNIQUE: Multidetector CT imaging of the chest, abdomen and pelvis was
performed following the standard protocol without IV contrast.

[Series 5: cap with 5mm st · axial · 0.86mm/px · z∈[+830,+1370]mm · 8 of 133 slices shown, 10 images]
[im 13/133  mediastinal]
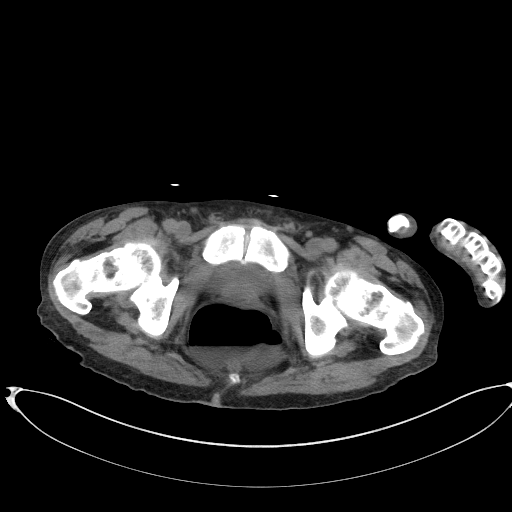
[im 13/133  lung]
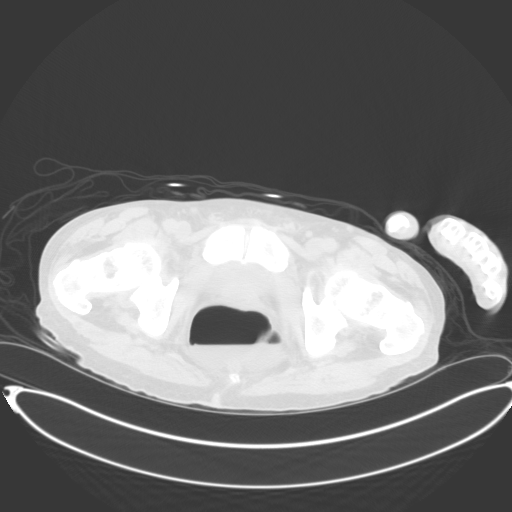
[im 25/133  lung]
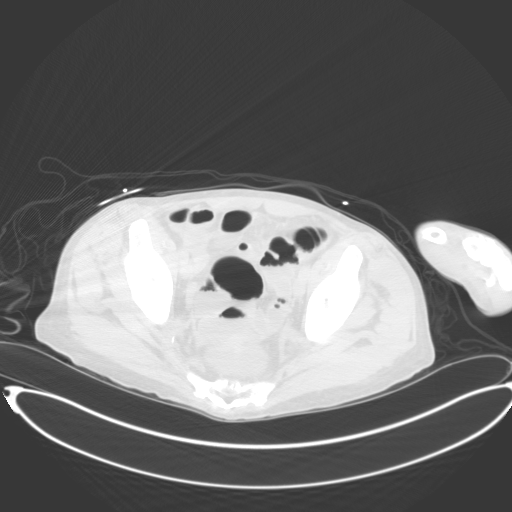
[im 49/133  lung]
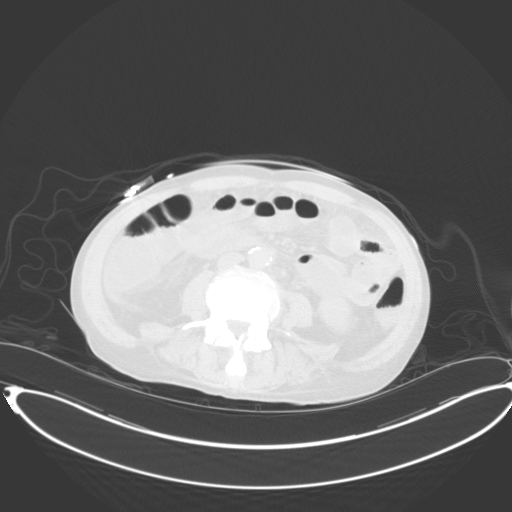
[im 61/133  lung]
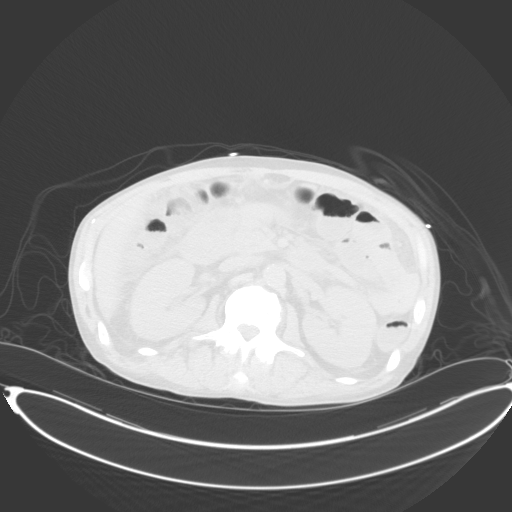
[im 73/133  mediastinal]
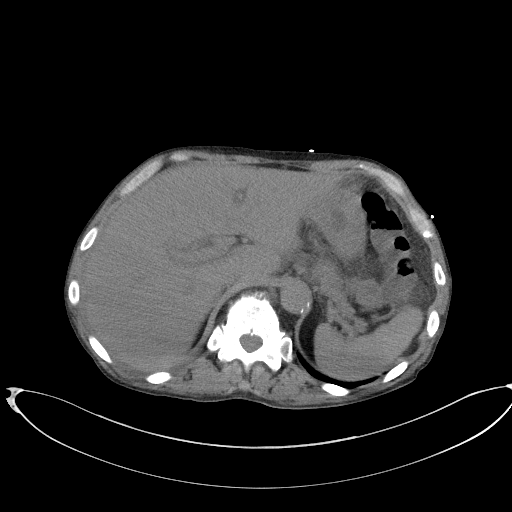
[im 73/133  lung]
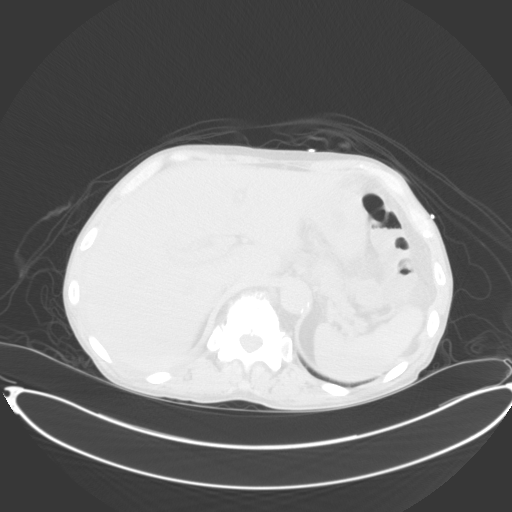
[im 85/133  lung]
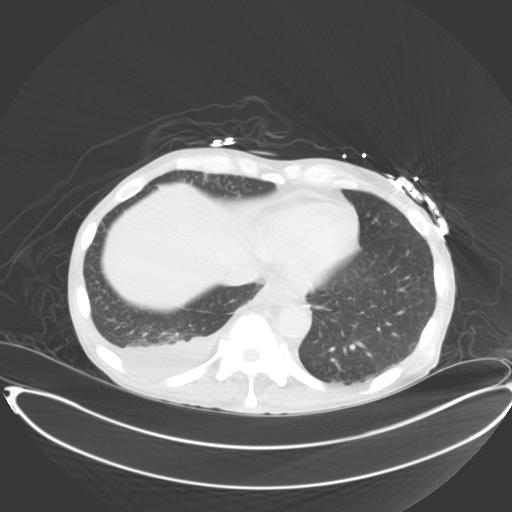
[im 109/133  lung]
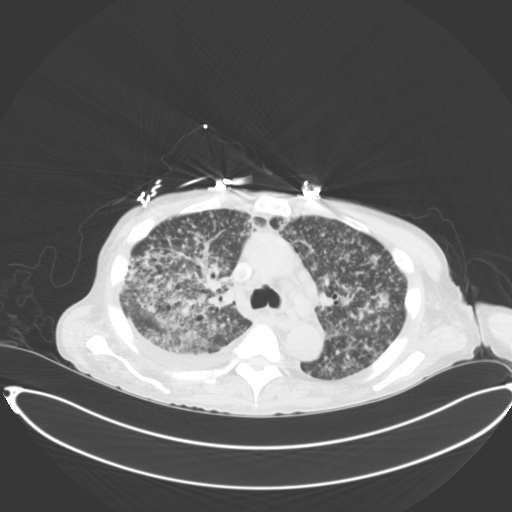
[im 121/133  lung]
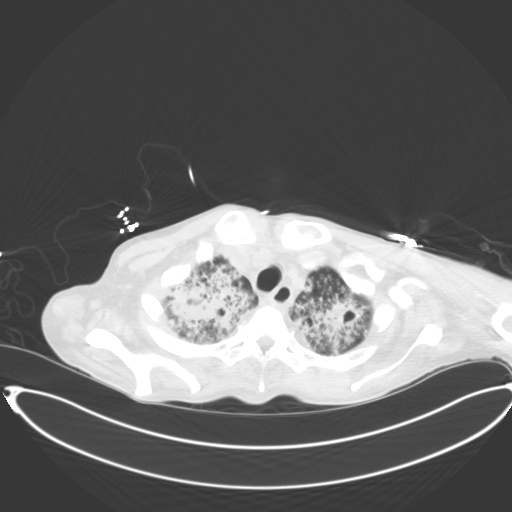

[Series 6: cap with 3mm st cor · coronal · 0.69mm/px · 3 of 127 slices shown]
[im 26/127  lung]
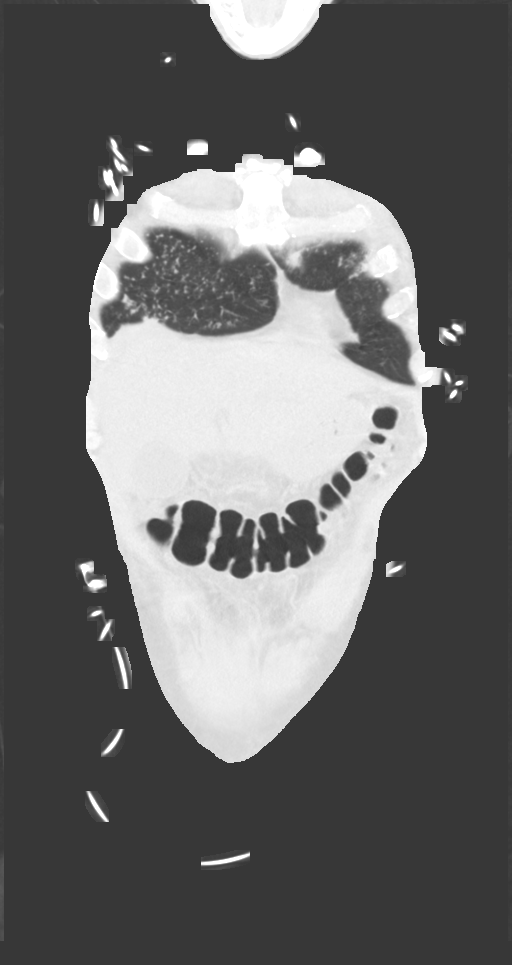
[im 51/127  lung]
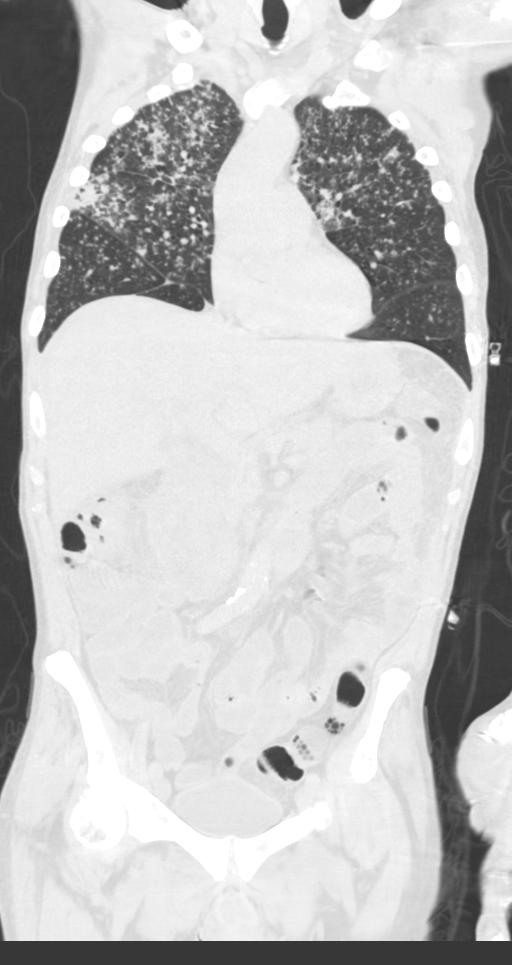
[im 76/127  lung]
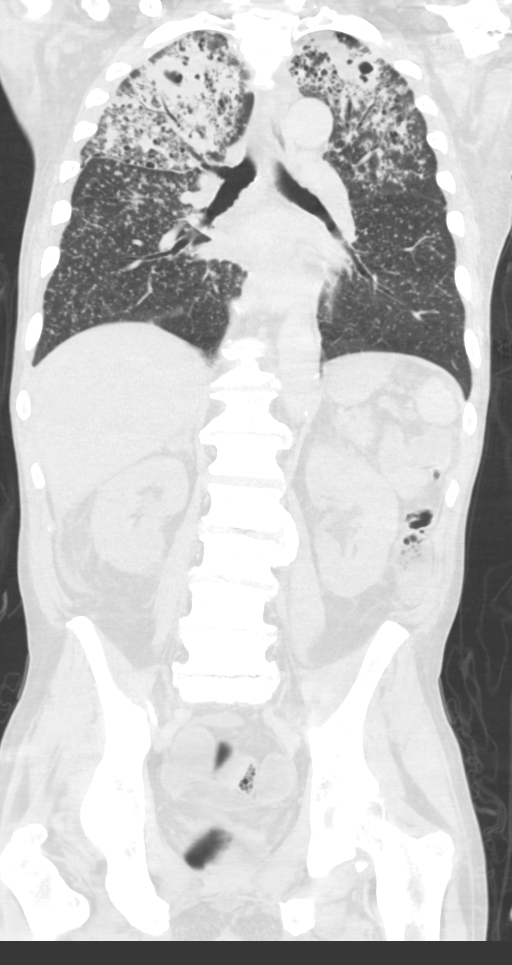

[11 of 36 positions shown; findings below may reference images not displayed]

Chest CTA 05/02/2018. CT
abdomen and pelvis 05/05/2018.

CONTRAST EXTRAVASATION CONSULTATION:
Type of contrast:  Isovue 300

Site of extravasation: Left antecubital fossa

Estimated volume of extravasation: 100 ml

Area of extravasation scanned with CT? no

PATIENT'S SIGNS AND SYMPTOMS

Skin blistering/ulceration: no

Decrease capillary refill: no

Change in skin color: no

Decreased motor function or severe tightness: no

Decreased pulses distal to site of extravasation: no

Altered sensation: no

Increasing pain or signs of increased swelling during observation:
no

TREATMENT

Observation period at site: Patient returned to the emergency
department for continued observation.

Orders placed for limb elevation and application of ice pack.

Plastic surgery consulted? no

DOCUMENTATION AND FOLLOW-UP

Post extravasation orders completed? yes

Patient's questions answered? yes

Patient instructed to call [phone] or seek immediate medical
care for new/progressive symptoms.
FINDINGS: CT CHEST FINDINGS

Cardiovascular: Thoracic aortic atherosclerosis without aneurysm.
Normal heart size. Scattered coronary artery calcification. No
significant pericardial effusion.

Mediastinum/Nodes: AP window lymph nodes measure up to 12 mm in
short axis, similar to the prior CT. Similar 1.4 cm short axis right
hilar lymph node. Mild motion and streak artifact through the
subcarinal region limits evaluation of the previously described
lymph node in this location. Unremarkable thyroid. Possible mild
esophageal wall thickening.

Lungs/Pleura: Small right pleural effusion, new from the prior CT.
No pneumothorax. Innumerable nodules are again seen diffusely
throughout both lungs, greatest in the upper lungs and less in the
bases. Bilateral upper lobe airspace consolidation is greatest in
the apices, overall stable to slightly progressed since the prior
CT. Small areas of cavitation within the biapical consolidation have
not significantly progressed. A small amount of patchy subpleural
consolidation anteriorly in the right middle lobe is new from the
prior CT, and patchy right middle lobe consolidation laterally has
increased. There is mild dependent atelectasis in the right lower
lobe.

Musculoskeletal: Postsurgical changes at the left shoulder. Mild
thoracic disc degeneration. No suspicious osseous lesion.

CT ABDOMEN PELVIS FINDINGS

The study is mildly motion degraded.

Hepatobiliary: No focal liver abnormality is seen. No gallstones,
gallbladder wall thickening, or biliary dilatation.

Pancreas: Unremarkable.

Spleen: Unremarkable.

Adrenals/Urinary Tract: Unchanged right adrenal nodule. Unremarkable
left adrenal gland. Unchanged 11 mm left lower pole renal
hypodensity, likely a cyst. Areas of subtle hypoenhancement in both
kidneys on the prior CT cannot be re-evaluated today due to lack of
IV contrast. There is no hydronephrosis. Unremarkable bladder.

Stomach/Bowel: The stomach is largely collapsed. Scattered fluid is
present throughout the colon, and there is also a moderate-sized
air-fluid level in the rectum. Fluid is also noted in nondilated
small bowel loops. There is no evidence of bowel obstruction. The
appendix is grossly unremarkable.

Vascular/Lymphatic: Mild abdominal aortic atherosclerosis without
aneurysm. No enlarged lymph nodes.

Reproductive: Unremarkable prostate.

Other: Trace ascites, decreased from prior. No loculated fluid
collection. Persistent diffusely edematous appearance of the
intra-abdominal fat. Unchanged 1.5 cm calcified mesenteric nodule in
the left mid abdomen (series 5, image 76).

Musculoskeletal: No suspicious osseous lesion. Lower lumbar disc
degeneration including severe disc space narrowing and spurring at
L4-5 and L5-S1 resulting in moderate to severe bilateral neural
foraminal stenosis.
IMPRESSION: 1. Diffuse lung nodules and apical predominant lung consolidation,
overall stable to mildly progressed from 05/02/2018 and consistent
with the patient's diagnosis of tuberculosis.
2. Small right pleural effusion, new from 05/02/2018.
3. Unchanged mild mediastinal and right hilar lymphadenopathy.
4. Scattered air-fluid levels in the colon and rectum suggesting a
diarrheal illness. No bowel obstruction.
5. Trace ascites, decreased from prior.
6. Unchanged calcified mesenteric nodule.
7.  Aortic Atherosclerosis ([id number]).
8. Contrast extravasation as documented above.

## 2018-12-03 IMAGING — DX DG CHEST 1V
1 series · 1 of 1 positions shown · non-contrast
Comparison: Chest CT, 05/23/2018. Chest radiographs, most recent
05/23/2018.

CLINICAL DATA: Encounter for fever and chills.

EXAM:
CHEST  1 VIEW

[chest]
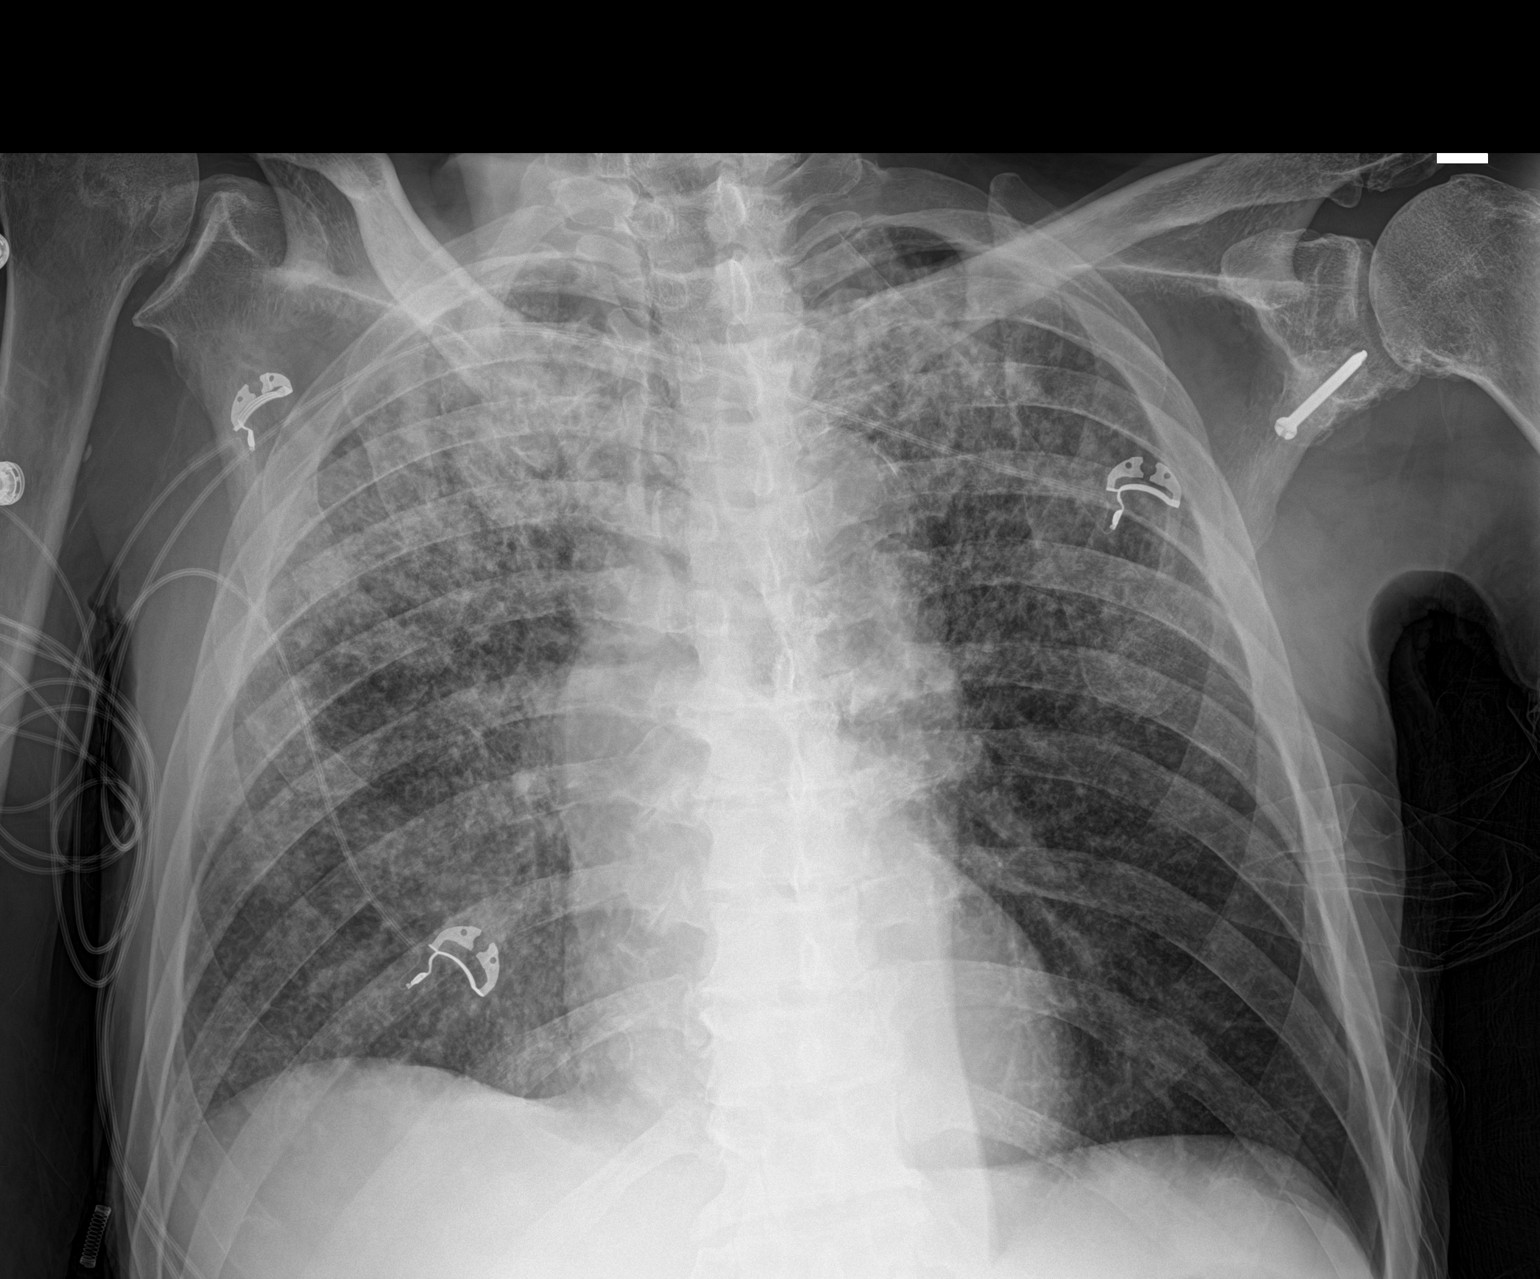

[1 of 1 positions shown; findings below may reference images not displayed]

FINDINGS: Multiple bilateral small lung nodules with hazy airspace type lung
opacity, most evident in the upper lobes, is without significant
change. No new lung abnormalities.

Heart is normal in size.  No mediastinal or hilar masses.

No convincing pleural effusion.  No pneumothorax.
IMPRESSION: 1. No significant change in the previously described bilateral lung
opacities and numerous bilateral lung nodules. Findings are
consistent with miliary tuberculosis.

## 2018-12-09 IMAGING — DX DG CHEST 1V PORT
1 series · 1 of 1 positions shown · non-contrast
Comparison: One-view chest x-ray 05/27/2018.

CLINICAL DATA: Shortness of breath.  Sepsis.

EXAM:
PORTABLE CHEST 1 VIEW

[chest ap]
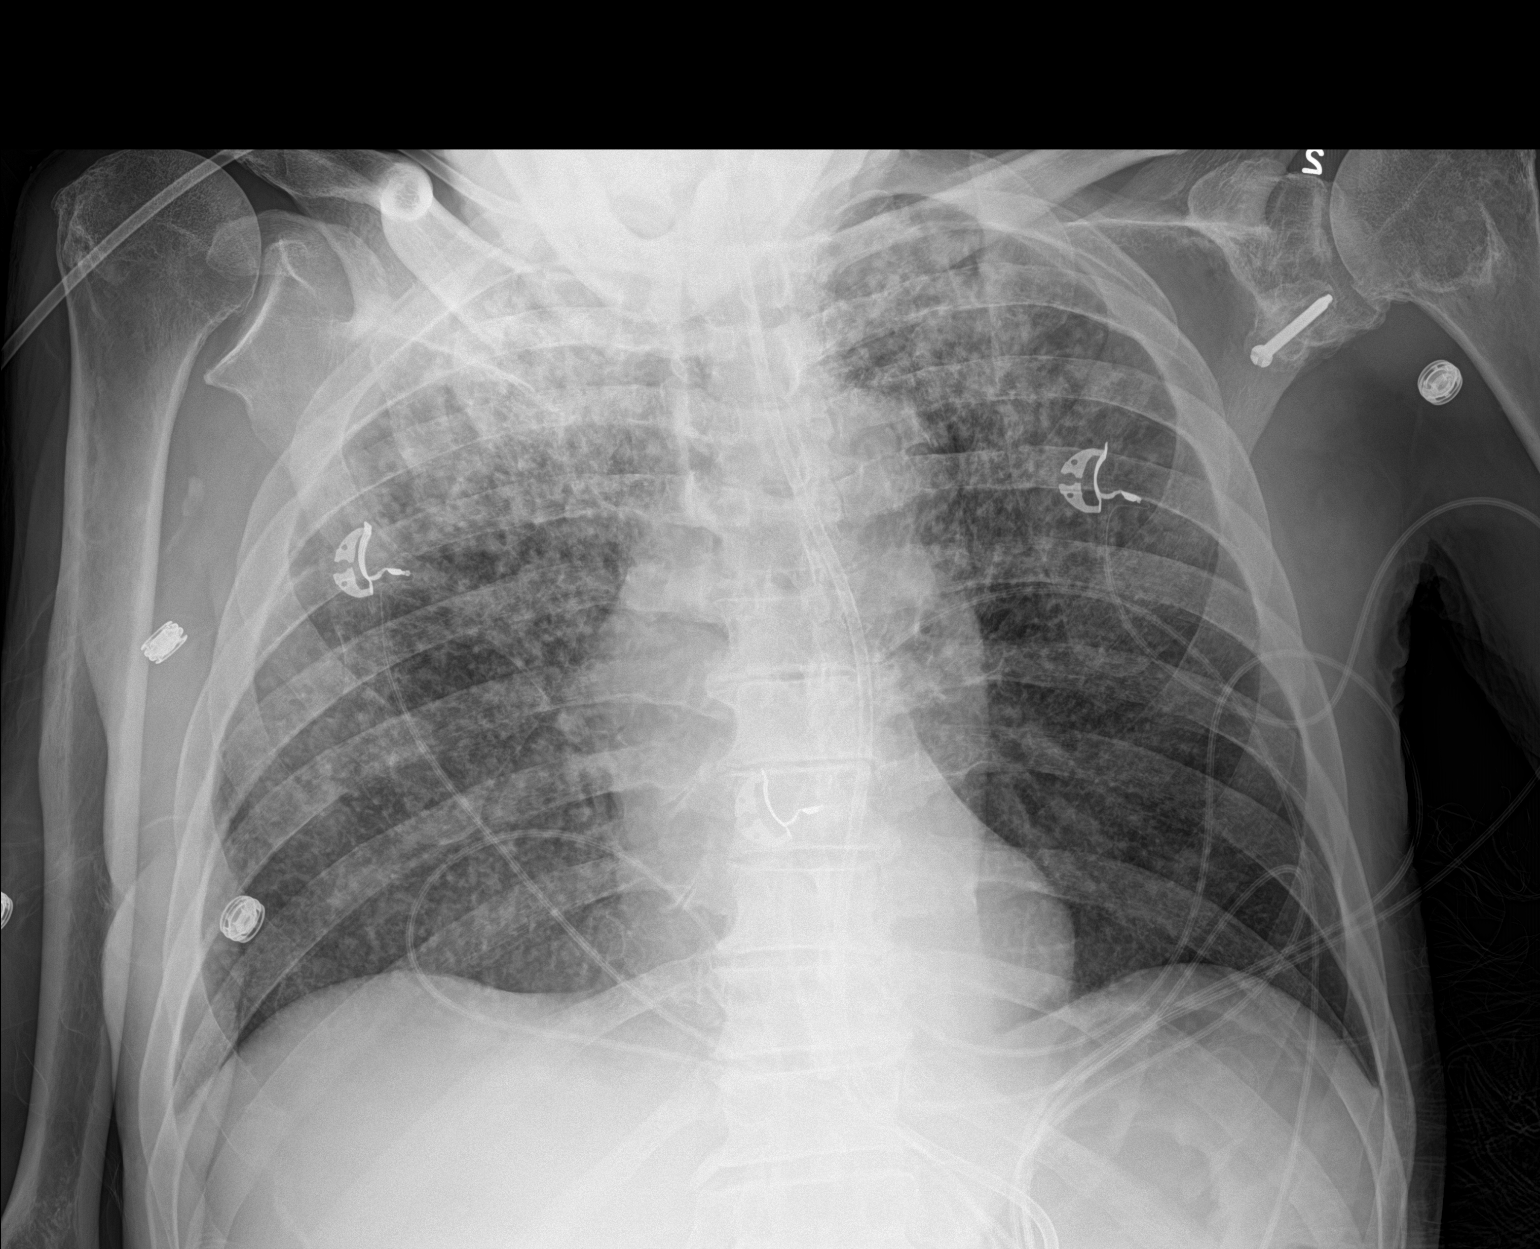

[1 of 1 positions shown; findings below may reference images not displayed]

FINDINGS: Bilateral upper lobe miliary pneumonia is not significantly changed.
A small bore feeding tube courses off the inferior border of the
film. There is no pneumothorax. No consolidation or cavitary lesion
is present.
IMPRESSION: Stable appearance of upper lobe predominant miliary pneumonia
compatible with TB.
